# Patient Record
Sex: Female | Born: 1935 | Race: White | Hispanic: No | Smoking: Never smoker
Health system: Southern US, Community
[De-identification: ages and names within clinical notes are randomized; demographics above are authoritative.]

## PROBLEM LIST (undated history)

## (undated) DIAGNOSIS — I10 Essential (primary) hypertension: Secondary | ICD-10-CM

## (undated) DIAGNOSIS — C4492 Squamous cell carcinoma of skin, unspecified: Secondary | ICD-10-CM

## (undated) DIAGNOSIS — I4819 Other persistent atrial fibrillation: Secondary | ICD-10-CM

## (undated) DIAGNOSIS — F419 Anxiety disorder, unspecified: Secondary | ICD-10-CM

## (undated) DIAGNOSIS — Z9289 Personal history of other medical treatment: Secondary | ICD-10-CM

## (undated) DIAGNOSIS — I440 Atrioventricular block, first degree: Secondary | ICD-10-CM

## (undated) DIAGNOSIS — E785 Hyperlipidemia, unspecified: Secondary | ICD-10-CM

## (undated) DIAGNOSIS — K219 Gastro-esophageal reflux disease without esophagitis: Secondary | ICD-10-CM

## (undated) DIAGNOSIS — E039 Hypothyroidism, unspecified: Secondary | ICD-10-CM

## (undated) DIAGNOSIS — J302 Other seasonal allergic rhinitis: Secondary | ICD-10-CM

## (undated) DIAGNOSIS — M1990 Unspecified osteoarthritis, unspecified site: Secondary | ICD-10-CM

## (undated) DIAGNOSIS — Z8719 Personal history of other diseases of the digestive system: Secondary | ICD-10-CM

## (undated) DIAGNOSIS — I471 Supraventricular tachycardia, unspecified: Secondary | ICD-10-CM

## (undated) DIAGNOSIS — IMO0002 Reserved for concepts with insufficient information to code with codable children: Secondary | ICD-10-CM

## (undated) HISTORY — DX: Atrioventricular block, first degree: I44.0

## (undated) HISTORY — DX: Gastro-esophageal reflux disease without esophagitis: K21.9

## (undated) HISTORY — PX: DILATION AND CURETTAGE OF UTERUS: SHX78

## (undated) HISTORY — PX: ABDOMINAL HYSTERECTOMY: SHX81

## (undated) HISTORY — DX: Supraventricular tachycardia, unspecified: I47.10

## (undated) HISTORY — DX: Supraventricular tachycardia: I47.1

## (undated) HISTORY — DX: Personal history of other medical treatment: Z92.89

## (undated) HISTORY — PX: LAPAROSCOPIC BILATERAL SALPINGO OOPHERECTOMY: SHX5890

## (undated) HISTORY — DX: Hypothyroidism, unspecified: E03.9

## (undated) HISTORY — PX: APPENDECTOMY: SHX54

## (undated) HISTORY — DX: Other persistent atrial fibrillation: I48.19

## (undated) HISTORY — DX: Essential (primary) hypertension: I10

## (undated) HISTORY — DX: Hyperlipidemia, unspecified: E78.5

## (undated) HISTORY — DX: Other seasonal allergic rhinitis: J30.2

---

## 1989-03-29 HISTORY — PX: KNEE ARTHROSCOPY: SHX127

## 1989-03-29 HISTORY — PX: CARDIAC CATHETERIZATION: SHX172

## 1999-01-09 ENCOUNTER — Ambulatory Visit (HOSPITAL_COMMUNITY): Admission: RE | Admit: 1999-01-09 | Discharge: 1999-01-09 | Payer: Self-pay | Admitting: Cardiology

## 1999-05-11 ENCOUNTER — Encounter: Admission: RE | Admit: 1999-05-11 | Discharge: 1999-05-11 | Payer: Self-pay | Admitting: Internal Medicine

## 1999-05-21 ENCOUNTER — Encounter: Admission: RE | Admit: 1999-05-21 | Discharge: 1999-05-21 | Payer: Self-pay | Admitting: Internal Medicine

## 1999-05-21 ENCOUNTER — Encounter: Payer: Self-pay | Admitting: Internal Medicine

## 2000-01-08 ENCOUNTER — Ambulatory Visit (HOSPITAL_COMMUNITY): Admission: RE | Admit: 2000-01-08 | Discharge: 2000-01-08 | Payer: Self-pay | Admitting: *Deleted

## 2000-06-10 ENCOUNTER — Encounter: Admission: RE | Admit: 2000-06-10 | Discharge: 2000-06-10 | Payer: Self-pay | Admitting: *Deleted

## 2001-02-26 ENCOUNTER — Encounter: Admission: RE | Admit: 2001-02-26 | Discharge: 2001-02-26 | Payer: Self-pay | Admitting: *Deleted

## 2001-03-12 ENCOUNTER — Other Ambulatory Visit: Admission: RE | Admit: 2001-03-12 | Discharge: 2001-03-12 | Payer: Self-pay | Admitting: Obstetrics and Gynecology

## 2001-04-07 ENCOUNTER — Ambulatory Visit: Admission: RE | Admit: 2001-04-07 | Discharge: 2001-04-07 | Payer: Self-pay | Admitting: Gynecology

## 2001-04-16 ENCOUNTER — Encounter: Payer: Self-pay | Admitting: Obstetrics and Gynecology

## 2001-04-21 ENCOUNTER — Inpatient Hospital Stay (HOSPITAL_COMMUNITY): Admission: RE | Admit: 2001-04-21 | Discharge: 2001-04-24 | Payer: Self-pay | Admitting: Obstetrics and Gynecology

## 2001-04-21 ENCOUNTER — Encounter (INDEPENDENT_AMBULATORY_CARE_PROVIDER_SITE_OTHER): Payer: Self-pay | Admitting: *Deleted

## 2001-06-15 ENCOUNTER — Encounter: Admission: RE | Admit: 2001-06-15 | Discharge: 2001-06-15 | Payer: Self-pay | Admitting: Obstetrics and Gynecology

## 2001-06-15 ENCOUNTER — Encounter: Payer: Self-pay | Admitting: Obstetrics and Gynecology

## 2001-08-28 ENCOUNTER — Encounter: Admission: RE | Admit: 2001-08-28 | Discharge: 2001-08-28 | Payer: Self-pay | Admitting: Obstetrics and Gynecology

## 2001-08-28 ENCOUNTER — Encounter: Payer: Self-pay | Admitting: Obstetrics and Gynecology

## 2002-06-17 ENCOUNTER — Encounter: Admission: RE | Admit: 2002-06-17 | Discharge: 2002-06-17 | Payer: Self-pay | Admitting: Internal Medicine

## 2003-02-21 ENCOUNTER — Ambulatory Visit (HOSPITAL_COMMUNITY): Admission: RE | Admit: 2003-02-21 | Discharge: 2003-02-21 | Payer: Self-pay | Admitting: Gastroenterology

## 2003-06-21 ENCOUNTER — Encounter: Admission: RE | Admit: 2003-06-21 | Discharge: 2003-06-21 | Payer: Self-pay | Admitting: Internal Medicine

## 2005-04-15 ENCOUNTER — Encounter: Payer: Self-pay | Admitting: Internal Medicine

## 2005-05-10 ENCOUNTER — Encounter: Admission: RE | Admit: 2005-05-10 | Discharge: 2005-05-10 | Payer: Self-pay | Admitting: Internal Medicine

## 2006-04-18 ENCOUNTER — Encounter: Admission: RE | Admit: 2006-04-18 | Discharge: 2006-04-18 | Payer: Self-pay | Admitting: Internal Medicine

## 2006-07-24 ENCOUNTER — Encounter: Payer: Self-pay | Admitting: Internal Medicine

## 2007-10-14 ENCOUNTER — Encounter: Admission: RE | Admit: 2007-10-14 | Discharge: 2007-10-14 | Payer: Self-pay | Admitting: Internal Medicine

## 2008-12-24 ENCOUNTER — Inpatient Hospital Stay (HOSPITAL_COMMUNITY): Admission: EM | Admit: 2008-12-24 | Discharge: 2008-12-25 | Payer: Self-pay | Admitting: Emergency Medicine

## 2009-01-12 ENCOUNTER — Encounter: Payer: Self-pay | Admitting: Internal Medicine

## 2009-01-19 ENCOUNTER — Ambulatory Visit: Payer: Self-pay | Admitting: *Deleted

## 2009-01-19 ENCOUNTER — Encounter: Payer: Self-pay | Admitting: Internal Medicine

## 2009-02-02 ENCOUNTER — Encounter: Payer: Self-pay | Admitting: Internal Medicine

## 2009-02-07 ENCOUNTER — Encounter: Payer: Self-pay | Admitting: Internal Medicine

## 2009-07-29 HISTORY — PX: CATARACT EXTRACTION W/ INTRAOCULAR LENS  IMPLANT, BILATERAL: SHX1307

## 2009-08-22 ENCOUNTER — Observation Stay (HOSPITAL_COMMUNITY): Admission: EM | Admit: 2009-08-22 | Discharge: 2009-08-23 | Payer: Self-pay | Admitting: Emergency Medicine

## 2009-08-22 ENCOUNTER — Ambulatory Visit: Payer: Self-pay | Admitting: Cardiology

## 2009-08-23 ENCOUNTER — Encounter: Payer: Self-pay | Admitting: Internal Medicine

## 2009-08-23 ENCOUNTER — Encounter (INDEPENDENT_AMBULATORY_CARE_PROVIDER_SITE_OTHER): Payer: Self-pay | Admitting: Internal Medicine

## 2009-08-29 ENCOUNTER — Telehealth: Payer: Self-pay | Admitting: Internal Medicine

## 2009-08-29 ENCOUNTER — Telehealth (INDEPENDENT_AMBULATORY_CARE_PROVIDER_SITE_OTHER): Payer: Self-pay | Admitting: *Deleted

## 2009-08-31 ENCOUNTER — Telehealth: Payer: Self-pay | Admitting: Internal Medicine

## 2009-09-04 ENCOUNTER — Ambulatory Visit: Payer: Self-pay | Admitting: Cardiovascular Disease

## 2009-09-04 DIAGNOSIS — I471 Supraventricular tachycardia: Secondary | ICD-10-CM

## 2009-09-05 ENCOUNTER — Telehealth (INDEPENDENT_AMBULATORY_CARE_PROVIDER_SITE_OTHER): Payer: Self-pay | Admitting: *Deleted

## 2009-09-18 ENCOUNTER — Ambulatory Visit: Payer: Self-pay | Admitting: Internal Medicine

## 2009-10-06 ENCOUNTER — Ambulatory Visit: Payer: Self-pay | Admitting: Internal Medicine

## 2009-10-06 ENCOUNTER — Inpatient Hospital Stay (HOSPITAL_COMMUNITY): Admission: EM | Admit: 2009-10-06 | Discharge: 2009-10-08 | Payer: Self-pay | Admitting: Emergency Medicine

## 2009-10-09 ENCOUNTER — Telehealth: Payer: Self-pay | Admitting: Internal Medicine

## 2009-10-11 ENCOUNTER — Ambulatory Visit: Payer: Self-pay | Admitting: Internal Medicine

## 2009-10-11 LAB — CONVERTED CEMR LAB: POC INR: 4

## 2009-10-16 ENCOUNTER — Encounter: Payer: Self-pay | Admitting: Internal Medicine

## 2009-10-16 ENCOUNTER — Ambulatory Visit: Payer: Self-pay | Admitting: Cardiology

## 2009-10-16 LAB — CONVERTED CEMR LAB: POC INR: 1.3

## 2009-10-24 ENCOUNTER — Ambulatory Visit: Payer: Self-pay | Admitting: Internal Medicine

## 2009-10-24 LAB — CONVERTED CEMR LAB: POC INR: 1.1

## 2009-10-31 ENCOUNTER — Ambulatory Visit: Payer: Self-pay | Admitting: Internal Medicine

## 2009-10-31 DIAGNOSIS — I4891 Unspecified atrial fibrillation: Secondary | ICD-10-CM

## 2009-10-31 DIAGNOSIS — I10 Essential (primary) hypertension: Secondary | ICD-10-CM

## 2009-11-07 ENCOUNTER — Ambulatory Visit: Payer: Self-pay | Admitting: Cardiovascular Disease

## 2009-11-07 LAB — CONVERTED CEMR LAB: POC INR: 1.5

## 2009-11-10 ENCOUNTER — Telehealth: Payer: Self-pay | Admitting: Internal Medicine

## 2009-11-14 ENCOUNTER — Ambulatory Visit: Payer: Self-pay | Admitting: Cardiovascular Disease

## 2009-11-21 ENCOUNTER — Ambulatory Visit: Payer: Self-pay | Admitting: Internal Medicine

## 2009-11-21 ENCOUNTER — Ambulatory Visit: Payer: Self-pay | Admitting: Cardiovascular Disease

## 2009-11-23 ENCOUNTER — Telehealth: Payer: Self-pay | Admitting: Internal Medicine

## 2009-11-23 LAB — CONVERTED CEMR LAB
BUN: 21 mg/dL (ref 6–23)
Calcium: 9.1 mg/dL (ref 8.4–10.5)
Chloride: 104 meq/L (ref 96–112)
Sodium: 141 meq/L (ref 135–145)

## 2009-11-24 ENCOUNTER — Telehealth: Payer: Self-pay | Admitting: Internal Medicine

## 2009-11-24 ENCOUNTER — Inpatient Hospital Stay (HOSPITAL_COMMUNITY): Admission: EM | Admit: 2009-11-24 | Discharge: 2009-11-25 | Payer: Self-pay | Admitting: Emergency Medicine

## 2009-11-24 ENCOUNTER — Ambulatory Visit: Payer: Self-pay | Admitting: Cardiology

## 2009-11-27 ENCOUNTER — Telehealth: Payer: Self-pay | Admitting: Internal Medicine

## 2009-11-28 ENCOUNTER — Ambulatory Visit: Payer: Self-pay | Admitting: Cardiology

## 2009-11-28 ENCOUNTER — Encounter: Payer: Self-pay | Admitting: Internal Medicine

## 2009-11-28 LAB — CONVERTED CEMR LAB: POC INR: 2.7

## 2009-12-05 ENCOUNTER — Telehealth: Payer: Self-pay | Admitting: Nurse Practitioner

## 2009-12-06 ENCOUNTER — Ambulatory Visit: Payer: Self-pay | Admitting: Internal Medicine

## 2009-12-06 ENCOUNTER — Encounter: Payer: Self-pay | Admitting: Internal Medicine

## 2009-12-06 LAB — CONVERTED CEMR LAB: POC INR: 5.1

## 2009-12-07 ENCOUNTER — Ambulatory Visit: Payer: Self-pay | Admitting: Internal Medicine

## 2009-12-07 ENCOUNTER — Ambulatory Visit (HOSPITAL_COMMUNITY): Admission: RE | Admit: 2009-12-07 | Discharge: 2009-12-07 | Payer: Self-pay | Admitting: Internal Medicine

## 2009-12-12 ENCOUNTER — Ambulatory Visit: Payer: Self-pay | Admitting: Internal Medicine

## 2009-12-13 ENCOUNTER — Ambulatory Visit: Payer: Self-pay | Admitting: Cardiovascular Disease

## 2009-12-13 ENCOUNTER — Inpatient Hospital Stay (HOSPITAL_COMMUNITY): Admission: EM | Admit: 2009-12-13 | Discharge: 2009-12-13 | Payer: Self-pay | Admitting: Emergency Medicine

## 2009-12-21 ENCOUNTER — Ambulatory Visit: Payer: Self-pay | Admitting: Cardiology

## 2009-12-21 ENCOUNTER — Encounter: Payer: Self-pay | Admitting: Internal Medicine

## 2009-12-21 ENCOUNTER — Ambulatory Visit: Payer: Self-pay | Admitting: Internal Medicine

## 2009-12-21 LAB — CONVERTED CEMR LAB: POC INR: 4

## 2009-12-22 ENCOUNTER — Telehealth: Payer: Self-pay | Admitting: Internal Medicine

## 2009-12-26 ENCOUNTER — Telehealth: Payer: Self-pay | Admitting: Internal Medicine

## 2009-12-28 ENCOUNTER — Ambulatory Visit: Payer: Self-pay

## 2009-12-28 ENCOUNTER — Encounter: Payer: Self-pay | Admitting: Internal Medicine

## 2010-01-03 ENCOUNTER — Ambulatory Visit: Payer: Self-pay | Admitting: Cardiology

## 2010-01-08 ENCOUNTER — Ambulatory Visit: Payer: Self-pay | Admitting: Internal Medicine

## 2010-01-08 ENCOUNTER — Encounter: Payer: Self-pay | Admitting: Cardiology

## 2010-01-17 ENCOUNTER — Ambulatory Visit: Payer: Self-pay | Admitting: Cardiovascular Disease

## 2010-01-17 LAB — CONVERTED CEMR LAB: POC INR: 2.3

## 2010-01-30 ENCOUNTER — Encounter: Payer: Self-pay | Admitting: Internal Medicine

## 2010-01-30 ENCOUNTER — Ambulatory Visit: Payer: Self-pay | Admitting: Internal Medicine

## 2010-02-14 ENCOUNTER — Telehealth: Payer: Self-pay | Admitting: Internal Medicine

## 2010-02-21 ENCOUNTER — Ambulatory Visit: Payer: Self-pay | Admitting: Cardiology

## 2010-02-21 LAB — CONVERTED CEMR LAB: POC INR: 1.7

## 2010-03-07 ENCOUNTER — Ambulatory Visit: Payer: Self-pay | Admitting: Cardiology

## 2010-03-07 LAB — CONVERTED CEMR LAB: POC INR: 1.8

## 2010-03-14 ENCOUNTER — Encounter: Payer: Self-pay | Admitting: Internal Medicine

## 2010-03-14 ENCOUNTER — Telehealth: Payer: Self-pay | Admitting: Internal Medicine

## 2010-03-15 ENCOUNTER — Ambulatory Visit: Payer: Self-pay | Admitting: Internal Medicine

## 2010-03-15 DIAGNOSIS — I495 Sick sinus syndrome: Secondary | ICD-10-CM | POA: Insufficient documentation

## 2010-03-16 ENCOUNTER — Ambulatory Visit: Payer: Self-pay | Admitting: Internal Medicine

## 2010-03-19 ENCOUNTER — Telehealth (INDEPENDENT_AMBULATORY_CARE_PROVIDER_SITE_OTHER): Payer: Self-pay | Admitting: *Deleted

## 2010-03-19 ENCOUNTER — Encounter: Payer: Self-pay | Admitting: Internal Medicine

## 2010-03-19 ENCOUNTER — Telehealth: Payer: Self-pay | Admitting: Internal Medicine

## 2010-03-20 LAB — CONVERTED CEMR LAB
BUN: 19 mg/dL (ref 6–23)
CO2: 31 meq/L (ref 19–32)
Calcium: 9.3 mg/dL (ref 8.4–10.5)
Creatinine, Ser: 1 mg/dL (ref 0.4–1.2)
GFR calc non Af Amer: 58.26 mL/min (ref >60)
Glucose, Bld: 81 mg/dL (ref 70–99)
Potassium: 4 meq/L (ref 3.5–5.1)
Prothrombin Time: 28.7 s — ABNORMAL HIGH (ref 9.7–11.8)

## 2010-03-22 ENCOUNTER — Ambulatory Visit: Payer: Self-pay | Admitting: Internal Medicine

## 2010-03-22 ENCOUNTER — Inpatient Hospital Stay (HOSPITAL_COMMUNITY): Admission: AD | Admit: 2010-03-22 | Discharge: 2010-03-26 | Payer: Self-pay | Admitting: Internal Medicine

## 2010-03-29 ENCOUNTER — Telehealth: Payer: Self-pay | Admitting: Internal Medicine

## 2010-03-30 ENCOUNTER — Telehealth (INDEPENDENT_AMBULATORY_CARE_PROVIDER_SITE_OTHER): Payer: Self-pay | Admitting: *Deleted

## 2010-03-30 ENCOUNTER — Encounter: Payer: Self-pay | Admitting: Internal Medicine

## 2010-04-03 ENCOUNTER — Ambulatory Visit: Payer: Self-pay | Admitting: Cardiovascular Disease

## 2010-04-11 ENCOUNTER — Encounter: Payer: Self-pay | Admitting: Physician Assistant

## 2010-04-11 ENCOUNTER — Ambulatory Visit: Payer: Self-pay | Admitting: Internal Medicine

## 2010-04-24 ENCOUNTER — Telehealth: Payer: Self-pay | Admitting: Internal Medicine

## 2010-04-30 ENCOUNTER — Ambulatory Visit: Payer: Self-pay | Admitting: Internal Medicine

## 2010-05-01 ENCOUNTER — Ambulatory Visit: Payer: Self-pay | Admitting: Cardiology

## 2010-05-28 ENCOUNTER — Telehealth: Payer: Self-pay | Admitting: Internal Medicine

## 2010-05-28 ENCOUNTER — Ambulatory Visit: Payer: Self-pay | Admitting: Internal Medicine

## 2010-05-28 DIAGNOSIS — E785 Hyperlipidemia, unspecified: Secondary | ICD-10-CM | POA: Insufficient documentation

## 2010-05-29 ENCOUNTER — Ambulatory Visit: Payer: Self-pay | Admitting: Cardiovascular Disease

## 2010-05-29 LAB — CONVERTED CEMR LAB: POC INR: 2.1

## 2010-06-10 ENCOUNTER — Telehealth: Payer: Self-pay | Admitting: Nurse Practitioner

## 2010-06-11 ENCOUNTER — Telehealth: Payer: Self-pay | Admitting: Internal Medicine

## 2010-06-28 ENCOUNTER — Ambulatory Visit: Payer: Self-pay | Admitting: Cardiovascular Disease

## 2010-06-28 LAB — CONVERTED CEMR LAB: POC INR: 1.5

## 2010-07-10 ENCOUNTER — Ambulatory Visit: Payer: Self-pay | Admitting: Internal Medicine

## 2010-07-12 LAB — CONVERTED CEMR LAB: POC INR: 1.5

## 2010-07-16 ENCOUNTER — Telehealth: Payer: Self-pay | Admitting: Internal Medicine

## 2010-07-27 ENCOUNTER — Ambulatory Visit: Payer: Self-pay | Admitting: Cardiovascular Disease

## 2010-07-27 LAB — CONVERTED CEMR LAB: POC INR: 1.5

## 2010-07-31 ENCOUNTER — Encounter: Payer: Self-pay | Admitting: Internal Medicine

## 2010-07-31 ENCOUNTER — Ambulatory Visit
Admission: RE | Admit: 2010-07-31 | Discharge: 2010-07-31 | Payer: Self-pay | Source: Home / Self Care | Attending: Internal Medicine | Admitting: Internal Medicine

## 2010-07-31 DIAGNOSIS — R519 Headache, unspecified: Secondary | ICD-10-CM | POA: Insufficient documentation

## 2010-07-31 DIAGNOSIS — R51 Headache: Secondary | ICD-10-CM | POA: Insufficient documentation

## 2010-08-02 ENCOUNTER — Telehealth: Payer: Self-pay | Admitting: Internal Medicine

## 2010-08-13 ENCOUNTER — Other Ambulatory Visit: Payer: Self-pay | Admitting: Internal Medicine

## 2010-08-13 ENCOUNTER — Ambulatory Visit: Admission: RE | Admit: 2010-08-13 | Discharge: 2010-08-13 | Payer: Self-pay | Source: Home / Self Care

## 2010-08-13 ENCOUNTER — Ambulatory Visit
Admission: RE | Admit: 2010-08-13 | Discharge: 2010-08-13 | Payer: Self-pay | Source: Home / Self Care | Attending: Internal Medicine | Admitting: Internal Medicine

## 2010-08-13 LAB — BASIC METABOLIC PANEL
BUN: 25 mg/dL — ABNORMAL HIGH (ref 6–23)
CO2: 29 mEq/L (ref 19–32)
Calcium: 9.3 mg/dL (ref 8.4–10.5)
Chloride: 99 mEq/L (ref 96–112)
Creatinine, Ser: 1.1 mg/dL (ref 0.4–1.2)
GFR: 49.3 mL/min — ABNORMAL LOW (ref >60.00)
Glucose, Bld: 87 mg/dL (ref 70–99)
Potassium: 4.7 mEq/L (ref 3.5–5.1)
Sodium: 137 mEq/L (ref 135–145)

## 2010-08-13 LAB — MAGNESIUM: Magnesium: 1.9 mg/dL (ref 1.5–2.5)

## 2010-08-26 LAB — CONVERTED CEMR LAB
Calcium: 9.5 mg/dL (ref 8.4–10.5)
Chloride: 98 meq/L (ref 96–112)
Creatinine, Ser: 1 mg/dL (ref 0.4–1.2)
POC INR: 3.2
Pro B Natriuretic peptide (BNP): 161 pg/mL — ABNORMAL HIGH (ref 0.0–100.0)
Sodium: 133 meq/L — ABNORMAL LOW (ref 135–145)

## 2010-08-28 ENCOUNTER — Ambulatory Visit: Admission: RE | Admit: 2010-08-28 | Discharge: 2010-08-28 | Payer: Self-pay | Source: Home / Self Care

## 2010-08-28 LAB — CONVERTED CEMR LAB: POC INR: 3.1

## 2010-08-28 NOTE — Progress Notes (Signed)
Summary: pt in a-fib  Phone Note Call from Patient Call back at Home Phone [phone]   Caller: Patient Reason for Call: Talk to Nurse, Talk to Doctor Summary of Call: pt went to pcp and was told she is back in a-fib and to call Dr. Graciela Husbands right away and all information would be faxed Initial call taken by: Omer Jack,  March 14, 2010 1:26 PM  Follow-up for Phone Call        spoke with pt, she could tell she was going into a fib and her pcp confirmed her rhythm. her rate was 50 to 60. she feels very tired and weak and is having alittle SOB. at her last visit she and dr Graciela Husbands talked about cardioversion but because she was in sinus it did not need to be done. she was on pradaxa but had to switch back to coumadin due to side effects. she recently had an INR of 1.8. will discuss with dr Doris Cheadle, RN  March 14, 2010 1:51 PM  Additional Follow-up for Phone Call Additional follow up Details #1::        discussed with dr Graciela Husbands, he will see the pt tomorrow at 4:30pm. pt aware Deliah Goody, RN  March 14, 2010 2:05 PM

## 2010-08-28 NOTE — Medication Information (Signed)
Summary: rov/tm  Anticoagulant Therapy  Managed by: Bethena Midget, RN, BSN Referring MD: Clifton James MD, Sherrilyn Rist MD: Eden Emms MD, Theron Arista Indication 1: Atrial Fibrillation Lab Used: [facility] [place] Site: [address] INR POC 2.1 INR RANGE 2.0-3.0  Dietary changes: no    Health status changes: no    Bleeding/hemorrhagic complications: no    Recent/future hospitalizations: no    Any changes in medication regimen? no    Recent/future dental: no  Any missed doses?: no       Is patient compliant with meds? yes      Comments: Pending DCCV  Allergies: 1)  ! Sulfa  Anticoagulation Management History:      The patient is taking warfarin and comes in today for a routine follow up visit.  Positive risk factors for bleeding include an age of 75 years or older.  The bleeding index is 'intermediate risk'.  Positive CHADS2 values include History of HTN and Age > 74 years old.  Anticoagulation responsible provider: Eden Emms MD, Theron Arista.  INR POC: 2.1.  Cuvette Lot#: [id number].  Exp: 12/2010.    Anticoagulation Management Assessment/Plan:      The patient's current anticoagulation dose is Warfarin sodium 2.5 mg tabs: Use as directed by Anticoagualtion Clinic.  The target INR is 2.0-3.0.  The next INR is due 11/28/2009.  Anticoagulation instructions were given to patient.  Results were reviewed/authorized by Bethena Midget, RN, BSN.  She was notified by Bethena Midget, RN, BSN.         Prior Anticoagulation Instructions: Pending DCCV INR 2.2 Continue 2.5mg s daily. Recheck in one week.   Current Anticoagulation Instructions: INR 2.1 Today 2 pills then change dose to 1 pill everyday except 2 pills on Tuesdays. Recheck in one week.

## 2010-08-28 NOTE — Medication Information (Signed)
Summary: new to coumadin/afib  Anticoagulant Therapy  Managed by: Bethena Midget, RN, BSN Referring MD: Clifton Ledger Heindl MD, Sherrilyn Rist MD: Johney Frame MD, Fayrene Fearing Indication 1: Atrial Fibrillation Lab Used: [facility] [name]: [address] INR POC 4.0 INR RANGE 2.0-3.0  Dietary changes: no    Health status changes: no    Bleeding/hemorrhagic complications: no    Recent/future hospitalizations: no    Any changes in medication regimen? no    Recent/future dental: no  Any missed doses?: no       Is patient compliant with meds? yes      Comments: Admitted [facility] from 3/11 to 3/13.  In hospital given 5mg s on 11th and 12th. INR on 3/12 was 1.09 and 1.52 on 13th. Discharged home on 2.5mg s daily.   Current Medications (verified): 1)  Metoprolol Succinate 50 Mg Xr24h-Tab (Metoprolol Succinate) .... Take 1.5 Tablet By Mouth Two Times A Day 2)  Zocor 20 Mg Tabs (Simvastatin) .[name].. 1 Tab At Bedtime 3)  Levoxyl 50 Mcg Tabs (Levothyroxine Sodium) .[name].. 1 Tab Once Daily 4)  Allegra 180 Mg Tabs (Fexofenadine Hcl) .[name].. 1 Tab Once Daily 5)  Aspirin 81 Mg Tbec (Aspirin) .... Take One Tablet By Mouth Daily 6)  Vitamin D 2000 Unit Tabs (Cholecalciferol) .[name].. 1 Tab Once Daily 7)  Protonix 40 Mg Tbec (Pantoprazole Sodium) .[name].. 1 Tab Once Daily 8)  Fish Oil 1000 Mg Caps (Omega-3 Fatty Acids) .... Once Daily 9)  Multivitamins  Tabs (Multiple Vitamin) .... Once Daily 10)  Icaps  Caps (Multiple Vitamins-Minerals) .... Take 1 Daily 11)  Warfarin Sodium 2.5 Mg Tabs (Warfarin Sodium) .... Use As Directed By Anticoagualtion Clinic 12)  Twynsta 80-5 Mg Tabs (Telmisartan-Amlodipine) .... Take 1 Tablet Daily  Allergies (verified): 1)  ! Sulfa  Anticoagulation Management History:      The patient comes in today for her initial visit for anticoagulation therapy.  Positive risk factors for bleeding include an age of 34 years or older.  The bleeding index is 'intermediate risk'.  Positive  CHADS2 values include Age > 29 years old.  Anticoagulation responsible provider: [name] MD, Fayrene Fearing.  INR POC: 4.0.  Cuvette Lot#: [id number].  Exp: 11/2010.    Anticoagulation Management Assessment/Plan:      The patient's current anticoagulation dose is Warfarin sodium 2.5 mg tabs: Use as directed by Anticoagualtion Clinic.  The target INR is 2.0-3.0.  The next INR is due 10/16/2009.  Anticoagulation instructions were given to patient.  Results were reviewed/authorized by Bethena Midget, RN, BSN.  She was notified by Bethena Midget, RN, BSN.         Current Anticoagulation Instructions: INR 4.0 Skip today's dose of warfarin then change dose to 1/2 pill everyday. Recheck in 5 days.

## 2010-08-28 NOTE — Medication Information (Signed)
Summary: rov/lb  Anticoagulant Therapy  Managed by: Eda Keys, PharmD Referring MD: Clifton James MD, Sherrilyn Rist MD: Gala Romney MD, Reuel Boom Indication 1: Atrial Fibrillation Lab Used: [facility] [place] Site: [address] INR POC 1.1 INR RANGE 2.0-3.0  Dietary changes: no    Health status changes: yes       Details: pt has been experiencing chest tightness but is scheduled to see dr. Graciela Husbands next week  Bleeding/hemorrhagic complications: no    Recent/future hospitalizations: no    Any changes in medication regimen? yes       Details: see updated med list  Recent/future dental: no  Any missed doses?: no       Is patient compliant with meds? yes       Current Medications (verified): 1)  Metoprolol Succinate 50 Mg Xr24h-Tab (Metoprolol Succinate) .... Take 1.5 Tablet By Mouth Two Times A Day 2)  Zocor 20 Mg Tabs (Simvastatin) .[name].. 1 Tab At Bedtime 3)  Levoxyl 50 Mcg Tabs (Levothyroxine Sodium) .[name].. 1 Tab Once Daily 4)  Allegra 180 Mg Tabs (Fexofenadine Hcl) .[name].. 1 Tab Once Daily 5)  Aspirin 81 Mg Tbec (Aspirin) .... Take One Tablet By Mouth Daily 6)  Vitamin D 2000 Unit Tabs (Cholecalciferol) .[name].. 1 Tab Once Daily 7)  Protonix 40 Mg Tbec (Pantoprazole Sodium) .[name].. 1 Tab Once Daily 8)  Fish Oil 1000 Mg Caps (Omega-3 Fatty Acids) .... Once Daily 9)  Multivitamins  Tabs (Multiple Vitamin) .... Once Daily 10)  Icaps  Caps (Multiple Vitamins-Minerals) .... Take 1 Daily 11)  Warfarin Sodium 2.5 Mg Tabs (Warfarin Sodium) .... Use As Directed By Anticoagualtion Clinic 12)  Mucinex Dm 30-600 Mg Xr12h-Tab (Dextromethorphan-Guaifenesin) .... Prn 13)  Micardis 80 Mg Tabs (Telmisartan) .... Take 1 Tablet Daily.  Allergies (verified): 1)  ! Sulfa  Anticoagulation Management History:      The patient is taking warfarin and comes in today for a routine follow up visit.  Positive risk factors for bleeding include an age of 43 years or older.  The bleeding index  is 'intermediate risk'.  Positive CHADS2 values include Age > 83 years old.  Anticoagulation responsible provider: Magally Vahle MD, Reuel Boom.  INR POC: 1.1.  Cuvette Lot#: [id number].  Exp: 11/2010.    Anticoagulation Management Assessment/Plan:      The patient's current anticoagulation dose is Warfarin sodium 2.5 mg tabs: Use as directed by Anticoagualtion Clinic.  The target INR is 2.0-3.0.  The next INR is due 10/31/2009.  Anticoagulation instructions were given to patient.  Results were reviewed/authorized by Eda Keys, PharmD.  She was notified by Eda Keys.         Prior Anticoagulation Instructions: INR 1.3  Start taking 1 tablet on Mondays and 1/2 tablet all other days of the week.  Recheck in 1 week.  Current Anticoagulation Instructions: INR 1.1  Start NEW dosing schedule of 1 tablet on Monday, Wednesday, and Friday, and 1/2 tablet all other days.  Return to clinic in 1 week.

## 2010-08-28 NOTE — Assessment & Plan Note (Signed)
Summary: eph/post cath/jss   Visit Type:  Follow-up Primary Provider:  Ricki Miller  CC:  no complaints.  History of Present Illness:  [name] is  in followup for atrial fibrillation with a variable ventricular response most recently quite rapid.  She has normal left ventricular function and a nonischemic Myoview in 2010. We undertook cardioversion without antiarrhythmic support about 3 or 4 weeks ago. She maintained sinus rhythm for just a few days and then reverted to a to fibrillation with a rapid ventricular response prompting repeat hospitalization. We then discussed rate versus rhythm control. Her heart rates were much better controlled on diltiazem and she felt better and she decided to go home. She returns today for followup.  She remains quite symptomatic with fatigue some tachycardia palpitations breathlessness and some left-sided chest pain.  She also knows that she has had her palm with a occipital headache dating to her cardioversion . reviewing her records her creatinine was 0.96 on May 18   Current Medications (verified): 1)  Metoprolol Succinate 100 Mg Xr24h-Tab (Metoprolol Succinate) .[name].. 1 By Mouth Two Times A Day 2)  Zocor 20 Mg Tabs (Simvastatin) .[name].. 1 Tab At Bedtime 3)  Levoxyl 50 Mcg Tabs (Levothyroxine Sodium) .[name].. 1 Tab Once Daily 4)  Allegra 180 Mg Tabs (Fexofenadine Hcl) .[name].. 1 Tab Once Daily 5)  Aspirin 81 Mg Tbec (Aspirin) .... Take One Tablet By Mouth Daily 6)  Vitamin D 2000 Unit Tabs (Cholecalciferol) .[name].. 1 Tab Once Daily 7)  Protonix 40 Mg Tbec (Pantoprazole Sodium) .[name].. 1 Tab Once Daily 8)  Fish Oil 1000 Mg Caps (Omega-3 Fatty Acids) .... Once Daily 9)  Multivitamins  Tabs (Multiple Vitamin) .... Once Daily 10)  Icaps  Caps (Multiple Vitamins-Minerals) .... Take 1 Daily 11)  Warfarin Sodium 2.5 Mg Tabs (Warfarin Sodium) .... Use As Directed By Anticoagualtion Clinic 12)  Mucinex Dm 30-600 Mg Xr12h-Tab (Dextromethorphan-Guaifenesin) .... Prn 13)  Micardis Hct  80-12.5 Mg Tabs (Telmisartan-Hctz) .... One Tablet Daily 14)  Alprazolam 0.25 Mg Tabs (Alprazolam) .... Take 1 Tablet By Mouth At Bedtime As Needed For Sleep 15)  Micardis 80 Mg Tabs (Telmisartan) .[name].. 1 By Mouth Daily 16)  Cardizem 120 Mg Tabs (Diltiazem Hcl) .[name].. 1 Podaily  Allergies (verified): 1)  ! Sulfa  Past History:  Past Medical History: Last updated: 10/31/2009 atrial fibrillation 1.  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     The patient reports having 1 Holter monitor in the past as        recently as this past summer with [facility].   2. History of chest pain in the setting of palpitations.       a.     The patient reports normal catheterization about 10 years        ago, performed by Dr. Aleen Campi.       b.     Normal stress test in 2010, performed by Dr. Reyes Ivan.   3. Hypertension.   4. Hypothyroidism.   5. Hyperlipidemia.   6. Gastroesophageal reflux disease.   7. Seasonal allergies.   Past Surgical History: Last updated: 09/20/09  1. Status post hysterectomy.   2. Status post laparoscopic bilateral salpingo-oophorectomy.   3. Knee surgery-right, arthroscopic  Family History: Last updated: 2009-09-20 Mother-101 when she died Father-died age 11 from cardiac issues 2 children both alive and healthy  Social History: Last updated: Sep 20, 2009 Married and lives here in [place] with her husband.  She has  two children, two grandchilren and three great grandchildren. She does not smoke, use drugs or alcohol.  Does not work outside the home Enjoys reading and going to the beach  Vital Signs:  Patient profile:   77 year old female Height:      62.5 inches Weight:      129 pounds BMI:     23.30 Pulse rate:   72 / minute Resp:     16 per minute BP sitting:   138 / 78  (right arm)  Vitals Entered By: Marrion Coy, CNA (Dec 21, 2009 1:17 PM)  Physical Exam  General:  The patient was alert and oriented  in no acute distress.Neck veins were flat, carotids were brisk. Lungs were clear. Heart sounds were irregular without murmurs or gallops. Abdomen was soft with active bowel sounds. There is no clubbing cyanosis or edema.    EKG  Procedure date:  12/21/2009  Findings:      72 Intervals-/2007/24 2 Axis is 35  Impression & Recommendations:  Problem # 1:  HEADACHE (ICD-784.0)  she presents with headache. Given her anticoagulation we will need to undertake CAT scanning. The following medications were removed from the medication list:    Aspirin 81 Mg Tbec (Aspirin) .[name]... Take one tablet by mouth daily Her updated medication list for this problem includes:    Metoprolol Succinate 100 Mg Xr24h-tab (Metoprolol succinate) .[name]... Take 1 tablet by mouth once a day  Orders: CT Scan  (CT Scan)  Problem # 2:  ATRIAL FIBRILLATION (ICD-427.31) For atrial fibrillation remains persistent. She remains quite symptomatic notwithstanding adequate heart rate control. Because of that we'll undertake antiarrhythmic strategy using flecainide 100 mg twice daily. We'll follow this with cardioversion.   we'll also plan to discontinue her aspirin which in conjunction do Coumadin only increases risk      Metoprolol Succinate 100 Mg Xr24h-tab (Metoprolol succinate) .[name]... 1 by mouth two times a day    Aspirin 81 Mg Tbec (Aspirin) .[name]... Take one tablet by mouth daily    Warfarin Sodium 2.5 Mg Tabs (Warfarin sodium) ..... Use as directed by anticoagualtion clinic  Problem # 3:  HYPERTENSION, BENIGN (ICD-401.1) retention is well controlled today. However, her medical regime required adjustments because of symptoms. We'll plan to decrease her metoprolol from 220 mg a day. We'll change her micardis 160/12.5 to 160/25.  Problem # 4:  CHEST PAIN UNSPECIFIED (ICD-786.50)  she has atypical chest pains with negative Myoview a year and a half ago The following medications were removed from the medication list:     Aspirin 81 Mg Tbec (Aspirin) .[name]... Take one tablet by mouth daily Her updated medication list for this problem includes:    Metoprolol Succinate 100 Mg Xr24h-tab (Metoprolol succinate) .[name]... Take 1 tablet by mouth once a day    Warfarin Sodium 2.5 Mg Tabs (Warfarin sodium) ..... Use as directed by anticoagualtion clinic    Cardizem 120 Mg Tabs (Diltiazem hcl) .[name]... 1 podaily  Her updated medication list for this problem includes:    Metoprolol Succinate 100 Mg Xr24h-tab (Metoprolol succinate) .[name]... 1 by mouth two times a day    Aspirin 81 Mg Tbec (Aspirin) .[name]... Take one tablet by mouth daily    Warfarin Sodium 2.5 Mg Tabs (Warfarin sodium) ..... Use as directed by anticoagualtion clinic    Cardizem 120 Mg Tabs (Diltiazem hcl) .[name]... 1 podaily  Patient Instructions: 1)  Your physician has recommended you make the following change in your medication: Start Flecainide 100mg   1 tablet twice daily.  Change Micardis to 1 tablet twice daily.  Decrease Metoprolol to 100mg  1 tablet daily. 2)  Your physician has recommended that you have a cardioversion (DCCV).  Electrical cardioversion uses a jolt of electricity to your heart either through paddles or wired patches attached to your chest. This is a controlled, usually prescheduled, procedure. Defibrillation is done under light anesthesia in the hospital, and you usually go home the day of the procedure. This is done to get your heart back into a normal rhythm. You are not awake for the procedure. Please see the instruction sheet given to you today. Prescriptions: METOPROLOL SUCCINATE 100 MG XR24H-TAB (METOPROLOL SUCCINATE) Take 1 tablet by mouth once a day  #30 x 11   Entered by:   Optometrist BSN   Authorized by:   Nathen May, MD, [facility]   Signed by:   Gypsy Balsam RN BSN on 12/21/2009   Method used:   Electronically to        [facility]  [phone]* (retail)       [address] [place], [place]  [address]       Ph:  [phone] or [phone]       Fax: [phone]   RxID:   [id number] MICARDIS HCT 80-12.5 MG TABS (TELMISARTAN-HCTZ) Take 2 tablets by mouth daily  #60 x 11   Entered by:   Optometrist BSN   Authorized by:   Nathen May, MD, [facility]   Signed by:   Gypsy Balsam RN BSN on 12/21/2009   Method used:   Electronically to        [facility]  [phone]* (retail)       [address] [place], [place]  [address]       Ph: [phone] or [phone]       Fax: [phone]   RxID:   [id number] FLECAINIDE ACETATE 100 MG TABS (FLECAINIDE ACETATE) Take one tablet by mouth every 12 hours  #60 x 11   Entered by:   Optometrist BSN   Authorized by:   Nathen May, MD, [facility]   Signed by:   Gypsy Balsam RN BSN on 12/21/2009   Method used:   Electronically to        [facility]  [phone]* (retail)       [address] [place], [place]  [address]       Ph: [phone] or [phone]       Fax: [phone]   RxID:   [id number]

## 2010-08-28 NOTE — Medication Information (Signed)
Summary: rov/sp  Anticoagulant Therapy  Managed by: Cloyde Reams, RN, BSN Referring MD: Clifton James MD, Cristal Deer PCP: Hunt Oris MD: Graciela Husbands MD, Viviann Spare Indication 1: Atrial Fibrillation Lab Used: [facility]  Site: [address] INR POC 3.2 INR RANGE 2.0-3.0  Dietary changes: no    Health status changes: no    Bleeding/hemorrhagic complications: no    Recent/future hospitalizations: no    Any changes in medication regimen? no    Recent/future dental: no  Any missed doses?: no       Is patient compliant with meds? yes       Allergies: 1)  ! Sulfa  Anticoagulation Management History:      The patient is taking warfarin and comes in today for a routine follow up visit.  Positive risk factors for bleeding include an age of 75 years or older.  The bleeding index is 'intermediate risk'.  Positive CHADS2 values include History of HTN and Age > 5 years old.  Her last INR was 5.1 ratio.  Anticoagulation responsible provider: Graciela Husbands MD, Viviann Spare.  INR POC: 3.2.  Cuvette Lot#: [id number].  Exp: 02/2011.    Anticoagulation Management Assessment/Plan:      The patient's current anticoagulation dose is Warfarin sodium 2.5 mg tabs: Use as directed by Anticoagualtion Clinic.  The target INR is 2.0-3.0.  The next INR is due 02/13/2010.  Anticoagulation instructions were given to patient.  Results were reviewed/authorized by Cloyde Reams, RN, BSN.  She was notified by Cloyde Reams RN.         Prior Anticoagulation Instructions: INR 2.3  Continue same dose of 1 tablet every day.    Current Anticoagulation Instructions: INR 3.2  Start taking 1 tablet daily except 1/2 tablet on Mondays and Fridays. Recheck in 2 weeks.

## 2010-08-28 NOTE — Assessment & Plan Note (Signed)
Summary: eph/needs bmp and ekg/amber   Visit Type:  Follow-up Primary Provider:  Ricki Miller  CC:  tightness in chest area.  History of Present Illness: This is an 77 year old white female patient who was recently hospitalized with atrial fibrillation for a tedious and load. She converted to normal sinus rhythm after the first tedious and dose with a 2 second pause. Her tickets and was decreased and she was discharged home.  She still has occasional chest tightnes and shortness of breath and feels her heart beating irregularly, but it has improved alot since discharge. She has some fatigue, but overall is feeling better.  Current Medications (verified): 1)  Metoprolol Succinate 100 Mg Xr24h-Tab (Metoprolol Succinate) .... Take 1 Tablet By Mouth Once A Day 2)  Zocor 20 Mg Tabs (Simvastatin) .[name].. 1 Tab At Bedtime 3)  Levoxyl 50 Mcg Tabs (Levothyroxine Sodium) .[name].. 1 Tab Once Daily 4)  Allegra 180 Mg Tabs (Fexofenadine Hcl) .[name].. 1 Tab Once Daily 5)  Vitamin D 2000 Unit Tabs (Cholecalciferol) .[name].. 1 Tab Once Daily 6)  Protonix 40 Mg Tbec (Pantoprazole Sodium) .[name].. 1 Tab Once Daily 7)  Fish Oil 1000 Mg Caps (Omega-3 Fatty Acids) .... Once Daily 8)  Multivitamins  Tabs (Multiple Vitamin) .... Once Daily 9)  Icaps  Caps (Multiple Vitamins-Minerals) .... Take 1 Daily 10)  Mucinex Dm 30-600 Mg Xr12h-Tab (Dextromethorphan-Guaifenesin) .... Prn 11)  Alprazolam 0.25 Mg Tabs (Alprazolam) .... Take 1 Tablet By Mouth At Bedtime As Needed For Sleep 12)  Cardizem 120 Mg Tabs (Diltiazem Hcl) .... Take 1 Capsule By Mouth Once A Day 13)  Micardis 80 Mg Tabs (Telmisartan) .... Take One Tablet in The Evening 14)  Warfarin Sodium 2.5 Mg Tabs (Warfarin Sodium) .... Use As Directed By Anticoagualtion Clinic 15)  Tikosyn 125 Mcg Caps (Dofetilide) .[name].. 1 Two Times A Day 16)  Spironolactone 25 Mg Tabs (Spironolactone) .... Take One Half  Tablet By Mouth Daily  Allergies (verified): 1)  ! Sulfa  Past History:  Past  Medical History: Last updated: 03/15/2010 atrial fibrillation failure to tolerate Pradaxa secondary to GI symptoms 1st degree AV block aggravated by flecainide 1.  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     The patient reports having 1 Holter monitor in the past as        recently as this past summer with [facility].   2. History of chest pain in the setting of palpitations.       a.     The patient reports normal catheterization about 10 years        ago, performed by Dr. Aleen Campi.       b.     Normal stress test in 2010, performed by Dr. Reyes Ivan.   3. Hypertension.   4. Hypothyroidism.   5. Hyperlipidemia.   6. Gastroesophageal reflux disease.   7. Seasonal allergies.   Review of Systems       see history of present illness  Vital Signs:  Patient profile:   77 year old female Height:      625 inches Weight:      132 pounds BMI:     0.24 Pulse rate:   68 / minute BP sitting:   128 / 74  (left arm) Cuff size:   regular  Vitals Entered By: Burnett Kanaris, CNA (April 11, 2010 9:50 AM)  Physical Exam  General:   Well-nournished, in no acute distress. Neck: Right carotid bruit,No JVD, HJR,  or thyroid enlargement Lungs: No tachypnea, clear without wheezing, rales, or rhonchi Cardiovascular: RRR, PMI not displaced, heart sounds normal, 2/6 systolic murmur at the left sternal border,no gallops, bruit, thrill, or heave. Abdomen: BS normal. Soft without organomegaly, masses, lesions or tenderness. Extremities: without cyanosis, clubbing or edema. Good distal pulses bilateral SKin: Warm, no lesions or rashes  Musculoskeletal: No deformities Neuro: no focal signs    EKG  Procedure date:  04/11/2010  Findings:      normal sinus rhythm with first degree AV block QT is 448 ms  Impression & Recommendations:  Problem # 1:  ATRIAL FIBRILLATION (ICD-427.31) Patient is maintaining normal sinus rhythm. She does have first  degree AV block. I will decrease her metoprolol to 50 mg daily.She is having less frequent chest pain since she is in normal sinus rhythm.We will check a BMET in 2 months. Her updated medication list for this problem includes:    Metoprolol Succinate 50 Mg Xr24h-tab (Metoprolol succinate) .[name]... Take one tablet by mouth daily    Warfarin Sodium 2.5 Mg Tabs (Warfarin sodium) ..... Use as directed by anticoagualtion clinic    Tikosyn 125 Mcg Caps (Dofetilide) .[name]... 1 two times a day  Orders: EKG w/ Interpretation (93000)  Problem # 2:  HYPERTENSION, BENIGN (ICD-401.1) Blood pressure much better controlled  The following medications were removed from the medication list:    Micardis Hct 80-12.5 Mg Tabs (Telmisartan-hctz) .[name]... Take one tablet once daily Her updated medication list for this problem includes:    Metoprolol Succinate 50 Mg Xr24h-tab (Metoprolol succinate) .[name]... Take one tablet by mouth daily    Cardizem 120 Mg Tabs (Diltiazem hcl) .[name]... Take 1 capsule by mouth once a day    Micardis 80 Mg Tabs (Telmisartan) .[name]... Take one tablet in the evening    Spironolactone 25 Mg Tabs (Spironolactone) .[name]... Take one half  tablet by mouth daily  The following medications were removed from the medication list:    Micardis Hct 80-12.5 Mg Tabs (Telmisartan-hctz) .[name]... Take one tablet once daily Her updated medication list for this problem includes:    Metoprolol Succinate 100 Mg Xr24h-tab (Metoprolol succinate) .[name]... Take 1 tablet by mouth once a day    Cardizem 120 Mg Tabs (Diltiazem hcl) .[name]... Take 1 capsule by mouth once a day    Micardis 80 Mg Tabs (Telmisartan) .[name]... Take one tablet in the evening    Spironolactone 25 Mg Tabs (Spironolactone) .[name]... Take one half  tablet by mouth daily  Patient Instructions: 1)  Your physician recommends that you schedule a follow-up appointment in: 1 month with Dr. Graciela Husbands 2)  Your physician recommends that you return for lab work in 2 months for a  BMP.401.1/427.31/wpa 3)  Your physician has recommended you make the following change in your medication: DECREASE your Metoprolol to 50mg  by mouth daily.

## 2010-08-28 NOTE — Progress Notes (Signed)
Summary: Pt calling about procedure/elevated BP  Phone Note Call from Patient Call back at Home Phone [phone]   Caller: Patient Summary of Call: Pt returning call back about a procedure Initial call taken by: Judie Grieve,  November 23, 2009 1:09 PM  Follow-up for Phone Call        elevated bp 199/161, elevated hr 103.....does not feel good at all , Migdalia Dk  November 24, 2009 8:11 AM  Spoke with pt who reports BP is elevated as above. Has not rechecked since above reading. Heart rate is now 73 and irregular. She states she is dizzy when she gets up and also having chest tightness. Chest tightness has been going on continuously throughout night. No SOB. States she is taking Micardis/HCT and also Micardis without HCT but cut back on these to one only (taking Micardis with HCT only) for 5 days. Took both yesterday. Also has questions about timing of cardioversion. Will discuss with Amber/Dr. Graciela Husbands. Dossie Arbour, RN, BSN  November 24, 2009 8:50 AM Discussed with Joice Lofts. Pt needs to go to hospital. Spoke with pt who has rechecked BP and it is now 198/151. Instructed her to call EMS and be transported to hospital. She does not want to call EMS but states her daughter is at the house with her and will take her to the hospital.  Will notify ED Follow-up by: Dossie Arbour, RN, BSN,  November 24, 2009 9:02 AM

## 2010-08-28 NOTE — Progress Notes (Signed)
Summary: speak to nurse/dr  Phone Note Call from Patient Call back at Home Phone [phone]   Caller: Patient Reason for Call: Talk to Nurse, Talk to Doctor Summary of Call: request call.... does not know whats going about the cardioversion and meds Initial call taken by: Migdalia Dk,  Dec 26, 2009 11:46 AM  Follow-up for Phone Call        Pt returning call regarding cardioversion Judie Grieve  December 27, 2009 8:12 AM  Additional Follow-up for Phone Call Additional follow up Details #1::        Spoke with patient.  Reviewed DCCV instructions.  She had stopped taking Flecainide because she felt better like she was in normal rhythm.  Advised the Flecainide would keep her in rhythm, but only occassionally converted without DCCV.  She will resume Flecainide and come in tomorrow morning for EKG before DCCV to verify rhythm.  Pt aware and agrees with plan. Gypsy Balsam RN BSN  December 27, 2009 12:38 PM

## 2010-08-28 NOTE — Medication Information (Signed)
Summary: rov/tm  Anticoagulant Therapy  Managed by: Jeralene Peters, PharmD Referring MD: Clifton James MD, Sherrilyn Rist MD: Shirlee Latch MD, Helem Reesor Indication 1: Atrial Fibrillation Lab Used: [facility] [place] Site: [address] INR POC 1.3 INR RANGE 2.0-3.0  Dietary changes: no    Health status changes: yes       Details: having chest pain and tightness x 3 days  Bleeding/hemorrhagic complications: no    Recent/future hospitalizations: no    Any changes in medication regimen? yes       Details: Mucinex-D for allergies  Recent/future dental: no  Any missed doses?: no       Is patient compliant with meds? yes      Comments: Triage nurse to evaluate chest pain today  Current Medications (verified): 1)  Metoprolol Succinate 50 Mg Xr24h-Tab (Metoprolol Succinate) .... Take 1.5 Tablet By Mouth Two Times A Day 2)  Zocor 20 Mg Tabs (Simvastatin) .[name].. 1 Tab At Bedtime 3)  Levoxyl 50 Mcg Tabs (Levothyroxine Sodium) .[name].. 1 Tab Once Daily 4)  Allegra 180 Mg Tabs (Fexofenadine Hcl) .[name].. 1 Tab Once Daily 5)  Aspirin 81 Mg Tbec (Aspirin) .... Take One Tablet By Mouth Daily 6)  Vitamin D 2000 Unit Tabs (Cholecalciferol) .[name].. 1 Tab Once Daily 7)  Protonix 40 Mg Tbec (Pantoprazole Sodium) .[name].. 1 Tab Once Daily 8)  Fish Oil 1000 Mg Caps (Omega-3 Fatty Acids) .... Once Daily 9)  Multivitamins  Tabs (Multiple Vitamin) .... Once Daily 10)  Icaps  Caps (Multiple Vitamins-Minerals) .... Take 1 Daily 11)  Warfarin Sodium 2.5 Mg Tabs (Warfarin Sodium) .... Use As Directed By Anticoagualtion Clinic 12)  Twynsta 80-5 Mg Tabs (Telmisartan-Amlodipine) .... Take 1 Tablet Daily 13)  Mucinex Dm 30-600 Mg Xr12h-Tab (Dextromethorphan-Guaifenesin) .... Prn  Allergies (verified): 1)  ! Sulfa  Anticoagulation Management History:      The patient is taking warfarin and comes in today for a routine follow up visit.  Positive risk factors for bleeding include an age of 1 years or older.   The bleeding index is 'intermediate risk'.  Positive CHADS2 values include Age > 35 years old.  Anticoagulation responsible provider: Shirlee Latch MD, Giannamarie Paulus.  INR POC: 1.3.  Cuvette Lot#: [id number].  Exp: 11/2010.    Anticoagulation Management Assessment/Plan:      The patient's current anticoagulation dose is Warfarin sodium 2.5 mg tabs: Use as directed by Anticoagualtion Clinic.  The target INR is 2.0-3.0.  The next INR is due 10/24/2009.  Anticoagulation instructions were given to patient.  Results were reviewed/authorized by Jeralene Peters, PharmD.         Prior Anticoagulation Instructions: INR 4.0 Skip today's dose of warfarin then change dose to 1/2 pill everyday. Recheck in 5 days.   Current Anticoagulation Instructions: INR 1.3  Start taking 1 tablet on Mondays and 1/2 tablet all other days of the week.  Recheck in 1 week.

## 2010-08-28 NOTE — Assessment & Plan Note (Signed)
Summary: eph/ appt is 1:00 p.m. per dr. klein./ gd   CC:  eph/ pt states she is still having chest tightness.  This is happening quite often.  Pt has not been taking the prescribed Diltiazem due to the way it makes her feel.  History of Present Illness: [name] is seen in followup for atrial tachycardia. she was seen in a hospital where the beta blockers were discontinued because of fatigue and calcium blockers were initiated.  however, she did not tolerate the diltiazem.  She comes in today with a diffuse chest discomfort. It has been there nonstop for 2 or 3 days. It is aggravated by taking deep breaths. There is no accompanying shortness of breath. She does have a history of GE reflux disease for which she takes a PPI.  faithfully there are no more palpitations. She is taking metoprolol at a slightly higher dose.  Current Medications (verified): 1)  Metoprolol Succinate 25 Mg Xr24h-Tab (Metoprolol Succinate) .[name].. 1 Tab Two Times A Day 2)  Zocor 20 Mg Tabs (Simvastatin) .[name].. 1 Tab At Bedtime 3)  Levoxyl 50 Mcg Tabs (Levothyroxine Sodium) .[name].. 1 Tab Once Daily 4)  Allegra 180 Mg Tabs (Fexofenadine Hcl) .[name].. 1 Tab Once Daily 5)  Ocuvite  Tabs (Multiple Vitamins-Minerals) .[name].. 1 Tab Once Daily 6)  Aspirin 81 Mg Tbec (Aspirin) .... Take One Tablet By Mouth Daily 7)  Vitamin D 2000 Unit Tabs (Cholecalciferol) .[name].. 1 Tab Once Daily 8)  Protonix 40 Mg Tbec (Pantoprazole Sodium) .[name].. 1 Tab Once Daily 9)  Losartan Potassium 100 Mg Tabs (Losartan Potassium) .[name].. 1 Tab Once Daily 10)  Fish Oil 1000 Mg Caps (Omega-3 Fatty Acids) .... Once Daily 11)  Multivitamins  Tabs (Multiple Vitamin) .... Once Daily  Allergies (verified): 1)  ! Sulfa  Past History:  Past Medical History: Last updated: 2009/10/02 1.  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     The patient reports having 1 Holter monitor in the past as        recently as this past summer with  [facility].   2. History of chest pain in the setting of palpitations.       a.     The patient reports normal catheterization about 10 years        ago, performed by Dr. Aleen Campi.       b.     Normal stress test in 2010, performed by Dr. Reyes Ivan.   3. Hypertension.   4. Hypothyroidism.   5. Hyperlipidemia.   6. Gastroesophageal reflux disease.   7. Seasonal allergies.   Past Surgical History: Last updated: 10-02-09  1. Status post hysterectomy.   2. Status post laparoscopic bilateral salpingo-oophorectomy.   3. Knee surgery-right, arthroscopic  Family History: Last updated: 10/02/09 Mother-101 when she died Father-died age 86 from cardiac issues 2 children both alive and healthy  Social History: Last updated: 2009/10/02 Married and lives here in [place] with her husband.  She has two children, two grandchilren and three great grandchildren. She does not smoke, use drugs or alcohol.  Does not work outside the home Enjoys reading and going to the beach  Vital Signs:  Patient profile:   77 year old female Height:      62.5 inches Weight:      134 pounds BMI:     24.21 Pulse rate:   68 / minute Pulse rhythm:   irregular  Vitals Entered By: Judithe Modest CMA (  September 18, 2009 1:26 PM)  Physical Exam  General:  The patient was alert and oriented in no acute distress. HEENT Normal.  Neck veins were flat, carotids were brisk.  Lungs were clear.  Heart sounds were regular without murmurs or gallops.  Abdomen was soft with active bowel sounds. There is no clubbing cyanosis or edema. Skin Warm and dry    EKG  Procedure date:  09/18/2009  Findings:      sinus rhythm at 68 Intervals 0.18/0.07/0.40 Axis is -4 Q Waves in V1 and V2 This is unchanged from February 2011  Impression & Recommendations:  Problem # 1:  CHEST PAIN UNSPECIFIED (ICD-786.50) Ma noncardiac chest pain. With its persistence I wonder if it's not a manifestation of worsening  GE reflux disease. I suggested that she take Pepcid or Zantac over-the-counter and if her symptoms persist for more than another few days and she should follow up with her primary care physician.  We will check a d-dimer today as well as troponin level today shows no underlying cardiac contributions to this. Her updated medication list for this problem includes:    Metoprolol Succinate 25 Mg Xr24h-tab (Metoprolol succinate) .[name]... 1 tab two times a day    Aspirin 81 Mg Tbec (Aspirin) .[name]... Take one tablet by mouth daily  Orders: TLB-BNP (B-Natriuretic Peptide) (83880-BNPR) T-D-Dimer Fibrin Derivatives Quantitive [phone])  Her updated medication list for this problem includes:    Metoprolol Succinate 25 Mg Xr24h-tab (Metoprolol succinate) .[name]... 1 tab two times a day    Aspirin 81 Mg Tbec (Aspirin) .[name]... Take one tablet by mouth daily  Problem # 2:  SVT/ PSVT/ PAT (ICD-427.0) tachycardia palpitations are currently well controlled Her updated medication list for this problem includes:    Metoprolol Succinate 25 Mg Xr24h-tab (Metoprolol succinate) .[name]... 1 tab two times a day    Aspirin 81 Mg Tbec (Aspirin) .[name]... Take one tablet by mouth daily  Orders: EKG w/ Interpretation (93000) TLB-BNP (B-Natriuretic Peptide) (83880-BNPR) T-D-Dimer Fibrin Derivatives Quantitive ([phone])  Patient Instructions: 1)  Your physician recommends that you HAVE LABS TODAY: BNP AND D-DIMER 2)  Your physician recommends that you schedule a follow-up appointment in: 6 MONTHS

## 2010-08-28 NOTE — Medication Information (Signed)
Summary: rov/cb  Anticoagulant Therapy  Managed by: Weston Brass, PharmD Referring MD: Clifton James MD, Cristal Deer PCP: Hunt Oris MD: Eden Emms MD, Theron Arista Indication 1: Atrial Fibrillation Lab Used: [facility]  Site: [address] INR POC 2.3 INR RANGE 2.0-3.0    Bleeding/hemorrhagic complications: yes       Details: has busted blood vessel in eye.  No change in vision.  Instructed pt to keep watching.  If it becomes painful or vision changes, call optomistrist.   Recent/future hospitalizations: no    Any changes in medication regimen? no    Recent/future dental: no  Any missed doses?: no       Is patient compliant with meds? yes       Allergies: 1)  ! Sulfa  Anticoagulation Management History:      The patient is taking warfarin and comes in today for a routine follow up visit.  Positive risk factors for bleeding include an age of 75 years or older.  The bleeding index is 'intermediate risk'.  Positive CHADS2 values include History of HTN and Age > 28 years old.  Her last INR was 5.1 ratio.  Anticoagulation responsible provider: Eden Emms MD, Theron Arista.  INR POC: 2.3.  Cuvette Lot#: [id number].  Exp: 03/2011.    Anticoagulation Management Assessment/Plan:      The patient's current anticoagulation dose is Warfarin sodium 2.5 mg tabs: Use as directed by Anticoagualtion Clinic.  The target INR is 2.0-3.0.  The next INR is due 01/30/2010.  Anticoagulation instructions were given to patient.  Results were reviewed/authorized by Weston Brass, PharmD.  She was notified by Weston Brass PharmD.         Prior Anticoagulation Instructions: INR 3.0. Take 1 tablet daily.  Recheck in 2 weeks.  Current Anticoagulation Instructions: INR 2.3  Continue same dose of 1 tablet every day.

## 2010-08-28 NOTE — Letter (Signed)
Summary: Cardioversion/TEE Instructions  Architectural technologist, Main Office  [address]   [place], [place] [address]   Phone: [phone]  Fax: [phone]    Cardioversion Instructions  You are scheduled for a Cardioversion on Thursday, Dec 07, 2009 at 2:00 PM with Dr. Graciela Husbands.   Please arrive at the [facility] at 12:00 NOON on the day of your procedure.  1)   DIET:         Do not eat anything heavy after midnight the night prior to your procedure.    You may have clear liquid breakfast, then nothing to eat or drink after 8:00 a.m.       Clear liquids include:  water, broth, Sprite, Ginger Ale, black coffee, tea (no sugar),      cranberry / grape / apple juice, jello (not red), popsicle from clear juices (not red).   2)   Come to the [place] office on Wednesday, Dec 06, 2009 in the morning for lab work. The lab at [facility] is open from 8:30 a.m. to 1:30 p.m. and 2:30 p.m. to 5:00 p.m. The lab at [facility] is open from 7:30 a.m. to 5:30 p.m. You do not have to be fasting.   3)   MAKE SURE YOU TAKE YOUR COUMADIN.   4)   YOU MAY TAKE ALL of your medications with a small amount of water.   5)  You must have a responsible person to drive you home.  6)   Bring a current list of your medications and current insurance cards.   * Special Note:  Every effort is made to have your procedure done on time. Occasionally there are emergencies that present themselves at the hospital that may cause delays. Please be patient if a delay does occur.  * If you have any questions after you get home, please call the office at [phone].

## 2010-08-28 NOTE — Medication Information (Signed)
Summary: ccr/mt  Anticoagulant Therapy  Managed by: Elaina Pattee, PharmD Referring MD: Clifton James MD, Cristal Deer PCP: Hunt Oris MD: Juanda Chance MD, Laury Huizar Indication 1: Atrial Fibrillation Lab Used: [facility] [place] Site: [address] INR RANGE 2.0-3.0  Dietary changes: no    Health status changes: no    Bleeding/hemorrhagic complications: no    Recent/future hospitalizations: no    Any changes in medication regimen? yes       Details: DC Flecainide.  Recent/future dental: no  Any missed doses?: no       Is patient compliant with meds? yes       Allergies: 1)  ! Sulfa  Anticoagulation Management History:      The patient is taking warfarin and comes in today for a routine follow up visit.  Positive risk factors for bleeding include an age of 75 years or older.  The bleeding index is 'intermediate risk'.  Positive CHADS2 values include History of HTN and Age > 75 years old.  Her last INR was 5.1 ratio.  Anticoagulation responsible provider: Juanda Chance MD, Smitty Cords.  Cuvette Lot#: [id number].  Exp: 02/2011.    Anticoagulation Management Assessment/Plan:      The patient's current anticoagulation dose is Warfarin sodium 2.5 mg tabs: Use as directed by Anticoagualtion Clinic.  The target INR is 2.0-3.0.  The next INR is due 01/17/2010.  Anticoagulation instructions were given to patient.  Results were reviewed/authorized by Elaina Pattee, PharmD.  She was notified by Elaina Pattee, PharmD.         Prior Anticoagulation Instructions: INR 4.0  Skip today's dose of Coumadin then resume same dose of 1 tablet every day except 2 tablets on Tuesday.   Current Anticoagulation Instructions: INR 3.0. Take 1 tablet daily.  Recheck in 2 weeks.

## 2010-08-28 NOTE — Medication Information (Signed)
Summary: Coumadin Clinic  Anticoagulant Therapy  Managed by: Weston Brass, PharmD Referring MD: Clifton James MD, Sherrilyn Rist MD: Ladona Ridgel MD, Sharlot Gowda Indication 1: Atrial Fibrillation Lab Used: [facility] [place] Site: [address] INR POC 5.1 INR RANGE 2.0-3.0    Bleeding/hemorrhagic complications: yes       Details: Pt fell 2 days ago.  Was seen by RN today to evaluate bruising  Recent/future hospitalizations: yes       Details: pending cardioversion tomorrow       Comments: Spoke with Amber, will plan to adjust Coumadin as normal and proceed with cardioversion tomorrow.   Allergies: 1)  ! Sulfa  Anticoagulation Management History:      Her anticoagulation is being managed by telephone today.  Positive risk factors for bleeding include an age of 20 years or older.  The bleeding index is 'intermediate risk'.  Positive CHADS2 values include History of HTN and Age > 78 years old.  Her last INR was 5.1 ratio.  Anticoagulation responsible provider: Ladona Ridgel MD, Sharlot Gowda.  INR POC: 5.1.  Exp: 12/2010.    Anticoagulation Management Assessment/Plan:      The patient's current anticoagulation dose is Warfarin sodium 2.5 mg tabs: Use as directed by Anticoagualtion Clinic.  The target INR is 2.0-3.0.  The next INR is due 12/12/2009.  Anticoagulation instructions were given to patient.  Results were reviewed/authorized by Weston Brass, PharmD.  She was notified by Weston Brass PharmD.         Prior Anticoagulation Instructions: INR 2.7 Pending DCCV next week, pre-op labs to be done.  Continue 1 pill everyday except 2 pills on Tuesdays. Recheck in two weeks.   Current Anticoagulation Instructions: INR 5.1  LMOM for pt.  Instructed her to hold her Coumadin today and tomorrow then resume normal dose of Coumadin which is 2.5mg  daily except 5mg  on Tuesday.  She is to report to the hospital as planned tomorrow for cardioversion.  Her next appt with Coumadin clinic is Tuesday  3/17

## 2010-08-28 NOTE — Medication Information (Signed)
Summary: rov/sp  Anticoagulant Therapy  Managed by: Lyna Poser, PharmD Referring MD: Clifton James MD, Cristal Deer PCP: Hunt Oris MD: Clifton James MD,Christopher Indication 1: Atrial Fibrillation Lab Used: [facility] [place] Site: [address] INR POC 2.1 INR RANGE 2.0-3.0  Dietary changes: no    Health status changes: no    Bleeding/hemorrhagic complications: no    Recent/future hospitalizations: no    Any changes in medication regimen? yes       Details: stopped taking micardis and simvastatin   Recent/future dental: no  Any missed doses?: yes     Details: took a whole tablet yesterday instead of a 1/2  Is patient compliant with meds? yes       Allergies: 1)  ! Sulfa 2)  ! Pradaxa (Dabigatran Etexilate Mesylate)  Anticoagulation Management History:      The patient is taking warfarin and comes in today for a routine follow up visit.  Positive risk factors for bleeding include an age of 75 years or older.  The bleeding index is 'intermediate risk'.  Positive CHADS2 values include History of HTN and Age > 61 years old.  Her last INR was 2.7 ratio.  Anticoagulation responsible provider: Clifton James MD,Christopher.  INR POC: 2.1.  Cuvette Lot#: [id number].  Exp: 04/2011.    Anticoagulation Management Assessment/Plan:      The patient's current anticoagulation dose is Warfarin sodium 2.5 mg tabs: Use as directed by Anticoagualtion Clinic.  The target INR is 2.0-3.0.  The next INR is due 06/26/2010.  Anticoagulation instructions were given to patient.  Results were reviewed/authorized by Lyna Poser, PharmD.  She was notified by Lyna Poser PharmD.         Prior Anticoagulation Instructions: INR 2.1  Continue same dose of 1 tablet every day except 1/2 tablet on Monday.  Recheck INR in 4 weeks.   Current Anticoagulation Instructions: INR 2.1 Continue taking a half tablet on monday. And take 1 tablet all other days. Recheck in 4 weeks.

## 2010-08-28 NOTE — Progress Notes (Signed)
  Faxed ROI over to [facility] to fax [phone] [facility]  September 05, 2009 9:23 AM     Appended Document:  Recieved Records from [facility] forwarded to [place].Marland KitchenKm

## 2010-08-28 NOTE — Assessment & Plan Note (Signed)
Summary: [name]   Visit Type:  Follow-up  CC:  chest pain.  History of Present Illness: 77 yo WF with history of HTN, hyperlipidemia, GERD with recent admission to [facility] 08/22/09 with palpitations and chest pain. She was seen by Dr. Shirlee Latch in consultation and then seen by Dr. Graciela Husbands for further management of her dysrhythmia. During the hospitalization, she was found to have  atrial tachycardia and junctional tachycardia with PVCs. She was started on Cardizem and beta blockade with resolution of symptoms prior to discharge. Dr. Graciela Husbands talked with her about an EP study and possible anti-arrhythmic therapy. Ultimately, she went home on both Cardizem and Toprol but called in to our office last week and stated that she was having chest tightness. Dr. Graciela Husbands suggested that she stop the Cardizem and continue the Toprol. She has made this change but continues to have constant substernal chest pain. She has had no palpitations, SOB, near syncope or syncope. Her chest pain does not change with exertion. It is not positional.   Of note, she has been followed in the past by Dr. Aleen Campi who performed a left heart cath 10 years ago that was reportedly normal. Dr. Reyes Ivan has followed her over the last year in [facility] and performed a stress test that was normal per the patient. She has no diagnosis of CAD.   Current Medications (verified): 1)  Metoprolol Succinate 25 Mg Xr24h-Tab (Metoprolol Succinate) .[name].. 1 Tab Two Times A Day 2)  Zocor 20 Mg Tabs (Simvastatin) .[name].. 1 Tab At Bedtime 3)  Levoxyl 50 Mcg Tabs (Levothyroxine Sodium) .[name].. 1 Tab Once Daily 4)  Allegra 180 Mg Tabs (Fexofenadine Hcl) .[name].. 1 Tab Once Daily 5)  Ocuvite  Tabs (Multiple Vitamins-Minerals) .[name].. 1 Tab Once Daily 6)  Aspirin 81 Mg Tbec (Aspirin) .... Take One Tablet By Mouth Daily 7)  Vitamin D 2000 Unit Tabs (Cholecalciferol) .[name].. 1 Tab Once Daily 8)  Protonix 40 Mg Tbec (Pantoprazole Sodium) .[name].. 1 Tab Once Daily 9)   Losartan Potassium 100 Mg Tabs (Losartan Potassium) .[name].. 1 Tab Once Daily  Allergies (verified): 1)  ! Sulfa  Past History:  Past Medical History: 1.  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     The patient reports having 1 Holter monitor in the past as        recently as this past summer with [facility].   2. History of chest pain in the setting of palpitations.       a.     The patient reports normal catheterization about 10 years        ago, performed by Dr. Aleen Campi.       b.     Normal stress test in 2010, performed by Dr. Reyes Ivan.   3. Hypertension.   4. Hypothyroidism.   5. Hyperlipidemia.   6. Gastroesophageal reflux disease.   7. Seasonal allergies.   Past Surgical History:  1. Status post hysterectomy.   2. Status post laparoscopic bilateral salpingo-oophorectomy.   3. Knee surgery-right, arthroscopic  Family History: Mother-101 when she died Father-died age 57 from cardiac issues 2 children both alive and healthy  Social History: Married and lives here in [place] with her husband.  She has two children, two grandchilren and three great grandchildren. She does not smoke, use drugs or alcohol.  Does not work outside the home Enjoys reading and going to [employer]  Review of Systems  The patient complains of chest pain and palpitations.  The patient denies fatigue, malaise, fever, weight gain/loss, vision loss, decreased hearing, hoarseness, shortness of breath, prolonged cough, wheezing, sleep apnea, coughing up blood, abdominal pain, blood in stool, nausea, vomiting, diarrhea, heartburn, incontinence, blood in urine, muscle weakness, joint pain, leg swelling, rash, skin lesions, headache, fainting, dizziness, depression, anxiety, enlarged lymph nodes, easy bruising or bleeding, and environmental allergies.    Vital Signs:  Patient profile:   77 year old female Height:      135 inches Weight:      65.5  pounds BMI:     2.54 Pulse rate:   66 / minute Pulse rhythm:   regular BP sitting:   144 / 80  (right arm) Cuff size:   regular  Vitals Entered By: Danielle Rankin, CMA (September 04, 2009 12:27 PM)  Physical Exam  General:  General: Well developed, well nourished, NAD HEENT: OP clear, mucus membranes moist SKIN: warm, dry Neuro: No focal deficits Musculoskeletal: Muscle strength 5/5 all ext Psychiatric: Mood and affect normal Neck: No JVD, no carotid bruits, no thyromegaly, no lymphadenopathy. Lungs:Clear bilaterally, no wheezes, rhonci, crackles CV: RRR no murmurs, gallops rubs Abdomen: soft, NT, ND, BS present Extremities: No edema, pulses 1-2+.    Echocardiogram  Procedure date:  08/23/2009  Findings:      - Left ventricle: The cavity size was normal. There was mild focal     basal hypertrophy of the septum. Systolic function was normal. The     estimated ejection fraction was in the range of 55% to 60%. Wall     motion was normal; there were no regional wall motion     abnormalities.   - Mitral valve: Mild regurgitation.   - Left atrium: The atrium was moderately dilated.   - Right atrium: The atrium was mildly dilated.   - Pulmonary arteries: Systolic pressure was mildly to moderately     increased. PA peak pressure: 45mm Hg (S).  EKG  Procedure date:  09/04/2009  Findings:      NSR, rate 66 bpm. Possible old septal infarct.   Impression & Recommendations:  Problem # 1:  SVT/ PSVT/ PAT (ICD-427.0) Telemetry in the hospital showed atrial tachycardia and junctional tachycardia.  She is currently not having palpitatoins on Toprol XL 25 mg by mouth two times a day. She will ultimately need a more definitive plan for her atrial tachycardia. She already has follow up with Dr. Graciela Husbands who saw her in the hospital last month. Further plans can be made at that visit for her arrythmia management. I will attempt to get all of her records from [facility] cardiology in the meantime. She  will follow with Dr. Graciela Husbands in the future.   Her updated medication list for this problem includes:    Metoprolol Succinate 25 Mg Xr24h-tab (Metoprolol succinate) .[name]... 1 tab two times a day    Aspirin 81 Mg Tbec (Aspirin) .[name]... Take one tablet by mouth daily  Problem # 2:  CHEST PAIN UNSPECIFIED (ICD-786.50) Atypical. Recent negative workup with stress test May 2010 at [facility] (Dr. Reyes Ivan). I will get the records of the stress test. Her pain does not sound typical for obstructive CAD. It also does not seem to be associated with her palpitations and arrythmias. I do not think that further ischemic testing is indicated at this time.   Her updated medication list for this problem includes:    Metoprolol Succinate 25 Mg Xr24h-tab (Metoprolol succinate) .Marland KitchenMarland KitchenMarland KitchenMarland Kitchen  1 tab two times a day    Aspirin 81 Mg Tbec (Aspirin) .[name]... Take one tablet by mouth daily  Patient Instructions: 1)  Your physician recommends that you schedule a follow-up appointment as needed with Dr. Clifton James.  Keep appointment with Dr. Graciela Husbands on February 21 at 1:15:  2)  Your physician recommends that you continue on your current medications as directed. Please refer to the Current Medication list given to you today.

## 2010-08-28 NOTE — Consult Note (Signed)
Summary: [facility]  [facility]   Imported By: Marylou Mccoy 10/17/2009 13:04:15  _____________________________________________________________________  External Attachment:    Type:   Image     Comment:   External Document

## 2010-08-28 NOTE — Medication Information (Signed)
Summary: rov/sp  Anticoagulant Therapy  Managed by: Reina Fuse, PharmD Referring MD: Clifton James MD, Cristal Deer PCP: Hunt Oris MD: Jens Som MD, Arlys John Indication 1: Atrial Fibrillation Lab Used: [facility] [place] Site: [address] INR POC 1.8 INR RANGE 2.0-3.0  Dietary changes: no    Health status changes: no    Bleeding/hemorrhagic complications: no    Recent/future hospitalizations: no    Any changes in medication regimen? no    Recent/future dental: no  Any missed doses?: no       Is patient compliant with meds? yes       Current Medications (verified): 1)  Metoprolol Succinate 100 Mg Xr24h-Tab (Metoprolol Succinate) .... Take 1 Tablet By Mouth Once A Day 2)  Zocor 20 Mg Tabs (Simvastatin) .[name].. 1 Tab At Bedtime 3)  Levoxyl 50 Mcg Tabs (Levothyroxine Sodium) .[name].. 1 Tab Once Daily 4)  Allegra 180 Mg Tabs (Fexofenadine Hcl) .[name].. 1 Tab Once Daily 5)  Vitamin D 2000 Unit Tabs (Cholecalciferol) .[name].. 1 Tab Once Daily 6)  Protonix 40 Mg Tbec (Pantoprazole Sodium) .[name].. 1 Tab Once Daily 7)  Fish Oil 1000 Mg Caps (Omega-3 Fatty Acids) .... Once Daily 8)  Multivitamins  Tabs (Multiple Vitamin) .... Once Daily 9)  Icaps  Caps (Multiple Vitamins-Minerals) .... Take 1 Daily 10)  Mucinex Dm 30-600 Mg Xr12h-Tab (Dextromethorphan-Guaifenesin) .... Prn 11)  Micardis Hct 80-12.5 Mg Tabs (Telmisartan-Hctz) .... Take One Tablet Once Daily 12)  Alprazolam 0.25 Mg Tabs (Alprazolam) .... Take 1 Tablet By Mouth At Bedtime As Needed For Sleep 13)  Cardizem 120 Mg Tabs (Diltiazem Hcl) .... Take 1 Capsule By Mouth Once A Day 14)  Micardis 80 Mg Tabs (Telmisartan) .... Take One Tablet in The Evening 15)  Warfarin Sodium 2.5 Mg Tabs (Warfarin Sodium) .... Use As Directed By Anticoagualtion Clinic  Allergies (verified): 1)  ! Sulfa  Anticoagulation Management History:      The patient is taking warfarin and comes in today for a routine follow up visit.  Positive risk  factors for bleeding include an age of 75 years or older.  The bleeding index is 'intermediate risk'.  Positive CHADS2 values include History of HTN and Age > 8 years old.  Her last INR was 5.1 ratio.  Anticoagulation responsible provider: Jens Som MD, Arlys John.  INR POC: 1.8.  Cuvette Lot#: [id number].  Exp: 04/2011.    Anticoagulation Management Assessment/Plan:      The patient's current anticoagulation dose is Warfarin sodium 2.5 mg tabs: Use as directed by Anticoagualtion Clinic.  The target INR is 2.0-3.0.  The next INR is due 03/28/2010.  Anticoagulation instructions were given to patient.  Results were reviewed/authorized by Reina Fuse, PharmD.  She was notified by Reina Fuse PharmD.         Prior Anticoagulation Instructions: INR 1.7  Take 1 1/2 tablets today then resume same dose of 1 tablet every day except 1/2 tablet on Monday and Friday.   Current Anticoagulation Instructions: INR 1.8  Today, August 10th, take Coumadin 2 tablets (5 mg).  Then Coumadin 1 tab (2.5 mg) all days except for Coumadin 0.5 (1.25 mg) on Mondays. Return to clinic in 3 weeks.

## 2010-08-28 NOTE — Progress Notes (Signed)
Summary: pt at hospital   Phone Note Call from Patient Call back at Home Phone [phone]   Caller: Spouse Reason for Call: Talk to Nurse Summary of Call: per pt hubsand calling back , pt at hospital.  Initial call taken by: Lorne Skeens,  November 24, 2009 3:28 PM  Follow-up for Phone Call        Line busy. Gypsy Balsam RN BSN  November 24, 2009 3:43 PM  Called patient and left message on machine [employer] RN BSN  November 24, 2009 6:30 PM

## 2010-08-28 NOTE — Progress Notes (Signed)
  Phone Note Outgoing Call   Call placed by: Scherrie Bateman, LPN,  March 30, 2010 2:26 PM Call placed to: Patient Summary of Call: PT AWARE PER MAIL ORDER PHARMACY TIKOSYN WILL BE DELIVERED NEXT DAY DELIVERY. Initial call taken by: Scherrie Bateman, LPN,  March 30, 2010 2:26 PM

## 2010-08-28 NOTE — Assessment & Plan Note (Signed)
Summary: ekg/verify rhythm pre DCCV/amber  Nurse Visit   Vital Signs:  Patient profile:   77 year old female BP sitting:   130 / 80  (left arm)  Impression & Recommendations: Patient came by today for EKG to check rhythm prior to scheduled cardioversion for today. Reviewed by Dr.Klein .Marland KitchenMarland KitchenMarland KitchenEKG showed sinus bradycardia so he ordered to cancell cardioversion for today and to stop Flecainide. She will return on 6/13 for anoth EKG to check PR interval and also made her follow up appointments with the coumadin clinic and to see Dr.Klein again 1st week of July. Trish the cardmaster notified of the cancellation of the cardioversion and she will notify scheduled MD and the short stay center at [facility].    Allergies: 1)  ! Sulfa

## 2010-08-28 NOTE — Letter (Signed)
Summary: [facility]  [facility]   Imported By: Roderic Ovens 10/04/2009 16:17:49  _____________________________________________________________________  External Attachment:    Type:   Image     Comment:   External Document

## 2010-08-28 NOTE — Consult Note (Signed)
[facility]   Imported By: Roderic Ovens 08/25/2009 14:28:52  _____________________________________________________________________  External Attachment:    Type:   Image     Comment:   External Document

## 2010-08-28 NOTE — Medication Information (Signed)
Summary: rov/sl  Anticoagulant Therapy  Managed by: Reina Fuse, PharmD Referring MD: Clifton James MD, Cristal Deer PCP: Hunt Oris MD: Eden Emms MD,Peter Indication 1: Atrial Fibrillation Lab Used: [facility] [place] Site: [address] INR POC 2.7 INR RANGE 2.0-3.0  Dietary changes: no    Health status changes: no    Bleeding/hemorrhagic complications: yes       Details: right eye  Recent/future hospitalizations: yes       Details: started tikosyn  Any changes in medication regimen? yes       Details: started taking tikosyn 1 week ago, stopped taking micardis hctz, just taking micardis  Recent/future dental: no  Any missed doses?: no       Is patient compliant with meds? yes       Allergies: 1)  ! Sulfa  Anticoagulation Management History:      The patient is taking warfarin and comes in today for a routine follow up visit.  Positive risk factors for bleeding include an age of 75 years or older.  The bleeding index is 'intermediate risk'.  Positive CHADS2 values include History of HTN and Age > 22 years old.  Her last INR was 2.7 ratio.  Anticoagulation responsible [place] Fettes: Eden Emms MD,Peter.  INR POC: 2.7.  Cuvette Lot#: [id number].  Exp: 04/2011.    Anticoagulation Management Assessment/Plan:      The patient's current anticoagulation dose is Warfarin sodium 2.5 mg tabs: Use as directed by Anticoagualtion Clinic.  The target INR is 2.0-3.0.  The next INR is due 05/01/2010.  Anticoagulation instructions were given to patient.  Results were reviewed/authorized by Reina Fuse, PharmD.  She was notified by Lyna Poser PharmD.         Prior Anticoagulation Instructions: INR 1.8  Today, August 10th, take Coumadin 2 tablets (5 mg).  Then Coumadin 1 tab (2.5 mg) all days except for Coumadin 0.5 (1.25 mg) on Mondays. Return to clinic in 3 weeks.   Current Anticoagulation Instructions: INR 2.7  Continue taking 1 tablet on all days except monday take 1/2 tablet. See  you in 4 weeks.

## 2010-08-28 NOTE — Medication Information (Signed)
Summary: rov/eac  Anticoagulant Therapy  Managed by: Bethena Midget, RN, BSN Referring MD: Clifton James MD, Sherrilyn Rist MD: Graciela Husbands MD, Viviann Spare Indication 1: Atrial Fibrillation Lab Used: [facility] [place] Site: [address] INR POC 1.3 INR RANGE 2.0-3.0  Dietary changes: no    Health status changes: yes       Details: See below  Bleeding/hemorrhagic complications: no    Recent/future hospitalizations: no    Any changes in medication regimen? no    Recent/future dental: no  Any missed doses?: no       Is patient compliant with meds? yes      Comments: Was in hosp. 3 weeks. ago and has had chest pain and SOB since discharge. Seeing Dr. Graciela Husbands today.  3  Allergies (verified): 1)  ! Sulfa  Anticoagulation Management History:      The patient is taking warfarin and comes in today for a routine follow up visit.  Positive risk factors for bleeding include an age of 75 years or older.  The bleeding index is 'intermediate risk'.  Positive CHADS2 values include Age > 48 years old.  Anticoagulation responsible provider: Graciela Husbands MD, Viviann Spare.  INR POC: 1.3.  Cuvette Lot#: [id number].  Exp: 11/2010.    Anticoagulation Management Assessment/Plan:      The patient's current anticoagulation dose is Warfarin sodium 2.5 mg tabs: Use as directed by Anticoagualtion Clinic.  The target INR is 2.0-3.0.  The next INR is due 11/07/2009.  Anticoagulation instructions were given to patient.  Results were reviewed/authorized by Bethena Midget, RN, BSN.  She was notified by Bethena Midget, RN, BSN.         Prior Anticoagulation Instructions: INR 1.1  Start NEW dosing schedule of 1 tablet on Monday, Wednesday, and Friday, and 1/2 tablet all other days.  Return to clinic in 1 week.   Current Anticoagulation Instructions: INR 1.3 Today 1 pill then change dose to 1 pill everyday except 1/2 pill on Mondays and Fridays. Recheck in 10 days.   Appended Document: Coumadin  Clinic    Anticoagulant Therapy  Managed by: Elaina Pattee, PharmD Referring MD: Clifton James MD, Sherrilyn Rist MD: Graciela Husbands MD, Viviann Spare Indication 1: Atrial Fibrillation Lab Used: [facility] [place] Site: [address] INR RANGE 2.0-3.0     Recent/future hospitalizations: yes       Details: Patient is pending DCCV. Notify Amber when INR >2 x 3 weeks.      Comments: Patient is pending DCCV. Notify Amber when INR >2 x 3 weeks.  Allergies: 1)  ! Sulfa  Anticoagulation Management History:      Positive risk factors for bleeding include an age of 62 years or older.  The bleeding index is 'intermediate risk'.  Positive CHADS2 values include History of HTN and Age > 31 years old.  Anticoagulation responsible provider: Graciela Husbands MD, Viviann Spare.  Exp: 11/2010.    Anticoagulation Management Assessment/Plan:      The patient's current anticoagulation dose is Warfarin sodium 2.5 mg tabs: Use as directed by Anticoagualtion Clinic.  The target INR is 2.0-3.0.  The next INR is due 11/07/2009.  Anticoagulation instructions were given to patient.  Results were reviewed/authorized by Elaina Pattee, PharmD.         Prior Anticoagulation Instructions: INR 1.3 Today 1 pill then change dose to 1 pill everyday except 1/2 pill on Mondays and Fridays. Recheck in 10 days.

## 2010-08-28 NOTE — Assessment & Plan Note (Signed)
Summary: rov/atrial fib/dm   Primary Provider:  Ricki Miller   History of Present Illness:  [name] is  in followup for atrial fibrillation with a variable ventricular response most recently quite rapid.  She has normal left ventricular function and a nonischemic Myoview in 2010. We undertook cardioversion without antiarrhythmic support about 3  months ago She maintained sinus rhythm for just a few days and then reverted to a to fibrillation with a rapid ventricular response prompting repeat hospitalization. We then discussed rate versus rhythm control. Her heart rates were much better controlled on diltiazem and she felt better and she decided to go home. She subsequently found that atrial fibrillation with controlled ventricular response was still quite symptomatic and we began her on flecainide. Unfortunately this is associated with significant PR prolongation prompting its discontinuation about a month ago. When seen last month she was in sinus rhythms and  feels much better in sinus rhythm.  She unfortunately reverted to atrial fibrillation yesterday and feels very weak and washed out.  She reminds me that she is under a great deal of stress from her husband who has Alzheimer's and her daughter who is working and whose granddaughter has now moved in following a divorce    Current Medications (verified): 1)  Metoprolol Succinate 100 Mg Xr24h-Tab (Metoprolol Succinate) .... Take 1 Tablet By Mouth Once A Day 2)  Zocor 20 Mg Tabs (Simvastatin) .[name].. 1 Tab At Bedtime 3)  Levoxyl 50 Mcg Tabs (Levothyroxine Sodium) .[name].. 1 Tab Once Daily 4)  Allegra 180 Mg Tabs (Fexofenadine Hcl) .[name].. 1 Tab Once Daily 5)  Vitamin D 2000 Unit Tabs (Cholecalciferol) .[name].. 1 Tab Once Daily 6)  Protonix 40 Mg Tbec (Pantoprazole Sodium) .[name].. 1 Tab Once Daily 7)  Fish Oil 1000 Mg Caps (Omega-3 Fatty Acids) .... Once Daily 8)  Multivitamins  Tabs (Multiple Vitamin) .... Once Daily 9)  Icaps  Caps (Multiple Vitamins-Minerals)  .... Take 1 Daily 10)  Mucinex Dm 30-600 Mg Xr12h-Tab (Dextromethorphan-Guaifenesin) .... Prn 11)  Micardis Hct 80-12.5 Mg Tabs (Telmisartan-Hctz) .... Take One Tablet Once Daily 12)  Alprazolam 0.25 Mg Tabs (Alprazolam) .... Take 1 Tablet By Mouth At Bedtime As Needed For Sleep 13)  Cardizem 120 Mg Tabs (Diltiazem Hcl) .... Take 1 Capsule By Mouth Once A Day 14)  Micardis 80 Mg Tabs (Telmisartan) .... Take One Tablet in The Evening 15)  Warfarin Sodium 2.5 Mg Tabs (Warfarin Sodium) .... Use As Directed By Anticoagualtion Clinic  Allergies (verified): 1)  ! Sulfa  Past History:  Past Surgical History: Last updated: 09/23/09  1. Status post hysterectomy.   2. Status post laparoscopic bilateral salpingo-oophorectomy.   3. Knee surgery-right, arthroscopic  Family History: Last updated: 2009/09/23 Mother-101 when she died Father-died age 49 from cardiac issues 2 children both alive and healthy  Social History: Last updated: 2009/09/23 Married and lives here in [place] with her husband.  She has two children, two grandchilren and three great grandchildren. She does not smoke, use drugs or alcohol.  Does not work outside the home Enjoys reading and going to [employer]  Past Medical History: atrial fibrillation failure to tolerate Pradaxa secondary to GI symptoms 1st degree AV block aggravated by flecainide 1.  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     The patient reports having 1 Holter monitor in the past as        recently as this past summer with [facility].   2. History  of chest pain in the setting of palpitations.       a.     The patient reports normal catheterization about 10 years        ago, performed by Dr. Aleen Campi.       b.     Normal stress test in 2010, performed by Dr. Reyes Ivan.   3. Hypertension.   4. Hypothyroidism.   5. Hyperlipidemia.   6. Gastroesophageal reflux disease.   7. Seasonal allergies.    Vital Signs:  Patient profile:   77 year old female Height:      62.5 inches Weight:      134 pounds BMI:     24.21 Pulse rate:   62 / minute Resp:     16 per minute BP sitting:   140 / 81  (left arm)  Vitals Entered By: Marrion Coy, CNA (March 15, 2010 4:20 PM)  Physical Exam  General:  The patient was alert and oriented in no acute distress.Neck veins were flat, carotids were brisk. Lungs were clear. Heart sounds were irregular without murmurs or gallops. Abdomen was soft with active bowel sounds. There is no clubbing cyanosis or edema. Tearful as she discuseed her husbands situation   EKG  Procedure date:  03/15/2010  Findings:      atrial fibrillation at 60 Intervals-/08/44 for Poor R-wave progression  Impression & Recommendations:  Problem # 1:  ATRIAL FIBRILLATION (ICD-427.31) the patient has recurrent atrial fibrillation which is quite symptomatic despite rate control. She has had significant problems with bradycardia in the context of 1C antiarrhythmictherapy as well as conduction system disease.  I don't think she is a candidate for either Rythmol or amiodarone because of that. She is also probably not a candidate for sotalol; this leaves tikosyn as our best option. Her QT interval today is 440 ms.  We discussed potential benefits as well as proarrhythmic risk. She is interested in pursuing this if she can arrange her home life adequately. She is to call and let us know. We will assess her potassium and her magnesium levels tomorrow as well as check her INR which has been subtherapeutic.  Previous attempts to use Pradaxa failed because of GI intolerance Her updated medication list for this problem includes:    Metoprolol Succinate 100 Mg Xr24h-tab (Metoprolol succinate) .[name]... Take 1 tablet by mouth once a day    Warfarin Sodium 2.5 Mg Tabs (Warfarin sodium) ..... Use as directed by anticoagualtion clinic  Orders: EKG w/ Interpretation (93000)  Problem # 2:   SICK SINUS/ TACHY-BRADY SYNDROME (ICD-427.81) she is both bradycardia as well as conduction system disease aggravated by flecaninde therapy.  will avoid similar drugs see above Her updated medication list for this problem includes:    Metoprolol Succinate 100 Mg Xr24h-tab (Metoprolol succinate) .[name]... Take 1 tablet by mouth once a day    Cardizem 120 Mg Tabs (Diltiazem hcl) .[name]... Take 1 capsule by mouth once a day    Warfarin Sodium 2.5 Mg Tabs (Warfarin sodium) ..... Use as directed by anticoagualtion clinic  Patient Instructions: 1)  Your physician recommends that you continue on your current medications as directed. Please refer to the Current Medication list given to you today. 2)  Your physician recommends that you return for lab work Friday 03/16/10  for a BMET, Mg, Pt/Inr ( anticipation of Tikosyn loading)  427.31

## 2010-08-28 NOTE — Progress Notes (Signed)
Summary: cardioversion  Phone Note Call from Patient Call back at Home Phone [phone]   Caller: Patient Reason for Call: Talk to Nurse Summary of Call: states she was to be sch for a cardioversion this week, req call back Initial call taken by: Migdalia Dk,  Nov 27, 2009 3:22 PM  Follow-up for Phone Call        pt is to be scheduled for a DCCV w TEE.  Will ask Dr Graciela Husbands if it's okay not to do TEE if INR above 2.0 tomorrow. Dennis Bast, RN, BSN  Nov 27, 2009 3:42 PM if pt's INR is > 2.0 today will be ok for DCCV for next week without TEE per Dr Benjiman Core, RN, BSN  Nov 28, 2009 10:06   Additional Follow-up for Phone Call Additional follow up Details #1::        Pt's INR 2.7 today per [facility].  Cardioversion without TEE scheduled for 12/07/09 at 2 PM.  Pt is to come in on 12/06/09 for labs.  Pt aware of dates.  Will send her instruction sheet for cardioversion with finalized information (time of cardioversion, when she is to arrive, etc.). Additional Follow-up by: Minerva Areola, RN, BSN,  Nov 28, 2009 2:38 PM

## 2010-08-28 NOTE — Assessment & Plan Note (Signed)
Summary: per night message pt fell has brusing and needs to be checked/lg  Nurse Visit   Vital Signs:  Patient profile:   77 year old female Weight:      132.50 pounds Pulse rate:   94 / minute  Vitals Entered By: Ollen Gross, RN, BSN (Dec 06, 2009 12:26 PM)  Impression & Recommendations:  Problem # 1:  ATRIAL FIBRILLATION (ICD-427.31)  Her updated medication list for this problem includes:    Metoprolol Succinate 100 Mg Xr24h-tab (Metoprolol succinate) .[name]... Take one tablet by mouth daily    Aspirin 81 Mg Tbec (Aspirin) .[name]... Take one tablet by mouth daily    Warfarin Sodium 2.5 Mg Tabs (Warfarin sodium) ..... Use as directed by anticoagualtion clinic  Orders: TLB-PT (Protime) (85610-PTP) Pt. in for nurse visit to assess pt's bruising and abrasion. Pt. fell two days ago. Pt. has an abrasion about an inch in lenth and width on right lower front  part of right leg which is  oozing slightly, light pink drainage. Pt. also has a large bruise and  moderate swelling on same leg right side close to knee, and  on upper thigh near the hip area is another large bruise. Also on right breast is a small bruise.  Amber Dr. Marikay Alar nurse advice to get today only PT/INR blood work . She will get in touch with Dr. Graciela Husbands about what to do next. Amber will call pt. later. Pt. aware. Pt. advice to go to her PCP to have the RLE abrasion check. Pt. verbalized understanding. See phone note. Gypsy Balsam RN BSN  Dec 07, 2009 8:03 AM    Allergies: 1)  ! Sulfa  Orders Added: 1)  TLB-PT (Protime) [85610-PTP]

## 2010-08-28 NOTE — Medication Information (Signed)
Summary: rov/cb **Pending DCCV**  Anticoagulant Therapy  Managed by: Weston Brass, PharmD Referring MD: Clifton James MD, Sherrilyn Rist MD: Excell Seltzer MD, Casimiro Needle Indication 1: Atrial Fibrillation Lab Used: [facility] [place] Site: [address] INR POC 1.5 INR RANGE 2.0-3.0  Dietary changes: yes       Details: ate an extra serving of green vegetables   Health status changes: no    Bleeding/hemorrhagic complications: no    Recent/future hospitalizations: no    Any changes in medication regimen? no    Recent/future dental: no  Any missed doses?: no       Is patient compliant with meds? yes       Allergies: 1)  ! Sulfa  Anticoagulation Management History:      The patient is taking warfarin and comes in today for a routine follow up visit.  Positive risk factors for bleeding include an age of 75 years or older.  The bleeding index is 'intermediate risk'.  Positive CHADS2 values include History of HTN and Age > 50 years old.  Anticoagulation responsible provider: Excell Seltzer MD, Casimiro Needle.  INR POC: 1.5.  Cuvette Lot#: [id number].  Exp: 11/2010.    Anticoagulation Management Assessment/Plan:      The patient's current anticoagulation dose is Warfarin sodium 2.5 mg tabs: Use as directed by Anticoagualtion Clinic.  The target INR is 2.0-3.0.  The next INR is due 11/14/2009.  Anticoagulation instructions were given to patient.  Results were reviewed/authorized by Weston Brass, PharmD.  She was notified by Weston Brass PharmD.         Prior Anticoagulation Instructions: INR 1.3 Today 1 pill then change dose to 1 pill everyday except 1/2 pill on Mondays and Fridays. Recheck in 10 days.   Current Anticoagulation Instructions: INR 1.5  Take 1 1/2 tablets today then increase dose to 1 tablet every day

## 2010-08-28 NOTE — Letter (Signed)
Summary: Cardioversion/TEE Instructions  Architectural technologist, Main Office  [address]   [place], [place] [address]   Phone: [phone]  Fax: [phone]    Cardioversion / TEE Cardioversion Instructions  You are scheduled for a Cardioversion on Thursday, Dec 07, 2009 at 2:00 PM with Dr. Graciela Husbands.   Please arrive at the [facility] at 12:00 NOON on the day of your procedure.  1)   DIET:    You may have a clear liquid breakfast, then nothing to eat or drink after 8:00 a.m.      Clear liquids include:  water, broth, Sprite, Ginger Ale, black coffee, tea (no sugar),      cranberry / grape / apple juice, jello (not red), popsicle from clear juices (not red).  2)   Come to the [place] office on Wednesday, Dec 06, 2009 in the morning for lab work. The lab at [facility] is open from 8:30 a.m. to 1:30 p.m. and 2:30 p.m. to 5:00 p.m. The lab at [facility] is open from 7:30 a.m. to 5:30 p.m. You do not have to be fasting.  3)   MAKE SURE YOU TAKE YOUR COUMADIN the day of the procedure.   4)  YOU MAY TAKE ALL of your medications with a small amount of water.   5)  Must have a responsible person to drive you home.  6)   Bring a current list of your medications and current insurance cards.   * Special Note:  Every effort is made to have your procedure done on time. Occasionally there are emergencies that present themselves at the hospital that may cause delays. Please be patient if a delay does occur.  * If you have any questions after you get home, please call the office at [phone].

## 2010-08-28 NOTE — Progress Notes (Signed)
Summary: Cardiology Phone Note - Abrasion and Bruising  Phone Note Call from Patient Call back at Home Phone [phone]   Caller: Patient Summary of Call: Received call from [name] stating that she fell and scraped her leg yesterday and has had some oozing from an abrasion that she obtained along with brusing.  She is concerned about her injuries but does not feel that they are severe.  She is scheduled for a DCCV later this week and blood work in our office tomorrow to re-eval her INR in hopes of avoiding need for a TEE.  I advised that she apply a guaze dressing with tape over the abrasion, applying a small amt. of pressure w/ the tape.  When she presents for labs tomorrow am, we will arrange for a nurse visit to assess her brusing and abrasion and determine whether or not we may need to hold coumadin and potentially scrap plans for DCCV considering that she will need uninterrupted coumadin following DCCV.  Pt. was grateful for the call-back. Initial call taken by: Creig Hines, ANP-BC,  Dec 05, 2009 9:40 PM

## 2010-08-28 NOTE — Progress Notes (Signed)
Summary: PT HAVING CHEST TIGHTNESS  Phone Note Call from Patient Call back at Home Phone [phone]   Caller: Patient Summary of Call: PT WAS GIVEN MEDS CALLED DILTIAZEM 180MG  QD TO SLOW HEART RATE DOWN AND SHE FEELS IT MAY BE TO SLOW AND SHE CHECKED IT AND THE RATE RANGES FROM 60-100 AND SHE FEELS SO TIRED AND SHE IS HAVING SOME CHEST TIGHTNESS AND SHE WAS JUST WONDERING WHAT TO DO Initial call taken by: Omer Jack,  August 29, 2009 1:36 PM  Follow-up for Phone Call        08/29/09--1pm--pt calling stating diltiazem, which she was put on in hospital is causing tightness in chest, and feeling extremely fatigued--spoke with dr Clide Cliff who states pt should d/c ditiazem and increase metoprlol to 50mg  two times a day--pt advised and agrees to do this--also f/u appoint made with dr Clifton James on 09/04/09 --already has appoint  for post hosp appoint with dr Clide Cliff  09/18/09 Follow-up by: Ledon Snare, RN,  August 29, 2009 3:15 PM    New/Updated Medications: METOPROLOL TARTRATE 50 MG TABS (METOPROLOL TARTRATE) Take one tablet by mouth twice a day Prescriptions: METOPROLOL TARTRATE 50 MG TABS (METOPROLOL TARTRATE) Take one tablet by mouth twice a day  #60 x 2   Entered by:   Ledon Snare, RN   Authorized by:   Verne Carrow, MD   Signed by:   Ledon Snare, RN on 08/29/2009   Method used:   Electron[facility] to        [facility]  [phone]* (retail)       [address] [place], [place]  [address]       Ph: [phone] or [phone]       Fax: [phone]   RxID:   [id number]

## 2010-08-28 NOTE — Medication Information (Signed)
Summary: rov/sp  Anticoagulant Therapy  Managed by: Bethena Midget, RN, BSN Referring MD: Clifton James MD, Sherrilyn Rist MD: Excell Seltzer MD, Casimiro Needle Indication 1: Atrial Fibrillation Lab Used: [facility]  Site: [address] INR POC 2.2 INR RANGE 2.0-3.0  Dietary changes: no    Health status changes: no    Bleeding/hemorrhagic complications: no    Recent/future hospitalizations: no    Any changes in medication regimen? yes       Details: Added Micardis HCT  Recent/future dental: no  Any missed doses?: no       Is patient compliant with meds? yes      Comments: Pending DCCV B/P 160/82  Current Medications (verified): 1)  Metoprolol Succinate 100 Mg Xr24h-Tab (Metoprolol Succinate) .... Take One Tablet By Mouth Daily 2)  Zocor 20 Mg Tabs (Simvastatin) .[name].. 1 Tab At Bedtime 3)  Levoxyl 50 Mcg Tabs (Levothyroxine Sodium) .[name].. 1 Tab Once Daily 4)  Allegra 180 Mg Tabs (Fexofenadine Hcl) .[name].. 1 Tab Once Daily 5)  Aspirin 81 Mg Tbec (Aspirin) .... Take One Tablet By Mouth Daily 6)  Vitamin D 2000 Unit Tabs (Cholecalciferol) .[name].. 1 Tab Once Daily 7)  Protonix 40 Mg Tbec (Pantoprazole Sodium) .[name].. 1 Tab Once Daily 8)  Fish Oil 1000 Mg Caps (Omega-3 Fatty Acids) .... Once Daily 9)  Multivitamins  Tabs (Multiple Vitamin) .... Once Daily 10)  Icaps  Caps (Multiple Vitamins-Minerals) .... Take 1 Daily 11)  Warfarin Sodium 2.5 Mg Tabs (Warfarin Sodium) .... Use As Directed By Anticoagualtion Clinic 12)  Mucinex Dm 30-600 Mg Xr12h-Tab (Dextromethorphan-Guaifenesin) .... Prn 13)  Micardis Hct 80-12.5 Mg Tabs (Telmisartan-Hctz) .... One Tablet Daily 14)  Alprazolam 0.25 Mg Tabs (Alprazolam) .... Take 1 Tablet By Mouth At Bedtime As Needed For Sleep  Allergies: 1)  ! Sulfa  Anticoagulation Management History:      The patient is taking warfarin and comes in today for a routine follow up visit.  Positive risk factors for bleeding include an age of 12 years or  older.  The bleeding index is 'intermediate risk'.  Positive CHADS2 values include History of HTN and Age > 75 years old.  Anticoagulation responsible provider: Excell Seltzer MD, Casimiro Needle.  INR POC: 2.2.  Cuvette Lot#: [id number].  Exp: 11/2010.    Anticoagulation Management Assessment/Plan:      The patient's current anticoagulation dose is Warfarin sodium 2.5 mg tabs: Use as directed by Anticoagualtion Clinic.  The target INR is 2.0-3.0.  The next INR is due 11/21/2009.  Anticoagulation instructions were given to patient.  Results were reviewed/authorized by Bethena Midget, RN, BSN.  She was notified by Bethena Midget, RN, BSN.         Prior Anticoagulation Instructions: INR 1.5  Take 1 1/2 tablets today then increase dose to 1 tablet every day   Current Anticoagulation Instructions: Pending DCCV INR 2.2 Continue 2.5mg s daily. Recheck in one week.

## 2010-08-28 NOTE — Assessment & Plan Note (Signed)
Summary: ekg  Nurse Visit   Vital Signs:  Patient profile:   77 year old female Weight:      134 pounds Pulse rate:   77 / minute Pulse rhythm:   irregular BP sitting:   122 / 80  (left arm)  Vitals Entered By: Meredith Staggers, RN (October 16, 2009 3:20 PM) Comments pt in for coumadin check today and c/o chest tightness, pt states it has been going on for 3-4 days, she states it is cont. and she has gotten some relief w/tylenol.  No SOB, no n/v, no radiating pain.  EKG obtained  showed a-fib at a rate of 77, med list reviewed w/pt.  Reviewed EKG w/Dr Graciela Husbands.  Pt will keep sch f/u appt on 4/5 w/Dr Levi Aland, RN  October 16, 2009 3:32 PM    Current Medications (verified): 1)  Metoprolol Succinate 50 Mg Xr24h-Tab (Metoprolol Succinate) .... Take 1.5 Tablet By Mouth Two Times A Day 2)  Zocor 20 Mg Tabs (Simvastatin) .[name].. 1 Tab At Bedtime 3)  Levoxyl 50 Mcg Tabs (Levothyroxine Sodium) .[name].. 1 Tab Once Daily 4)  Allegra 180 Mg Tabs (Fexofenadine Hcl) .[name].. 1 Tab Once Daily 5)  Aspirin 81 Mg Tbec (Aspirin) .... Take One Tablet By Mouth Daily 6)  Vitamin D 2000 Unit Tabs (Cholecalciferol) .[name].. 1 Tab Once Daily 7)  Protonix 40 Mg Tbec (Pantoprazole Sodium) .[name].. 1 Tab Once Daily 8)  Fish Oil 1000 Mg Caps (Omega-3 Fatty Acids) .... Once Daily 9)  Multivitamins  Tabs (Multiple Vitamin) .... Once Daily 10)  Icaps  Caps (Multiple Vitamins-Minerals) .... Take 1 Daily 11)  Warfarin Sodium 2.5 Mg Tabs (Warfarin Sodium) .... Use As Directed By Anticoagualtion Clinic 12)  Twynsta 80-5 Mg Tabs (Telmisartan-Amlodipine) .... Take 1 Tablet Daily 13)  Mucinex Dm 30-600 Mg Xr12h-Tab (Dextromethorphan-Guaifenesin) .... Prn  Allergies (verified): 1)  ! Sulfa

## 2010-08-28 NOTE — Medication Information (Signed)
Summary: rov/mw  Anticoagulant Therapy  Managed by: Weston Brass, PharmD Referring MD: Clifton James MD, Cristal Deer PCP: Hunt Oris MD: Eden Emms MD, Theron Arista Indication 1: Atrial Fibrillation Lab Used: [facility] [place] Site: [address] INR POC 1.5 INR RANGE 2.0-3.0  Dietary changes: no    Health status changes: no    Bleeding/hemorrhagic complications: no    Recent/future hospitalizations: no    Any changes in medication regimen? no    Recent/future dental: no  Any missed doses?: no       Is patient compliant with meds? yes       Allergies: 1)  ! Sulfa 2)  ! Pradaxa (Dabigatran Etexilate Mesylate)  Anticoagulation Management History:      The patient is taking warfarin and comes in today for a routine follow up visit.  Positive risk factors for bleeding include an age of 75 years or older.  The bleeding index is 'intermediate risk'.  Positive CHADS2 values include History of HTN and Age > 75 years old.  Her last INR was 2.7 ratio.  Anticoagulation responsible provider: Eden Emms MD, Theron Arista.  INR POC: 1.5.  Cuvette Lot#: [id number].  Exp: 06/2011.    Anticoagulation Management Assessment/Plan:      The patient's current anticoagulation dose is Warfarin sodium 2.5 mg tabs: Use as directed by Anticoagualtion Clinic.  The target INR is 2.0-3.0.  The next INR is due 07/10/2010.  Anticoagulation instructions were given to patient.  Results were reviewed/authorized by Weston Brass, PharmD.  She was notified by Weston Brass PharmD.         Prior Anticoagulation Instructions: INR 2.1 Continue taking a half tablet on monday. And take 1 tablet all other days. Recheck in 4 weeks.   Current Anticoagulation Instructions: INR 1.5  Take 1 1/2 tablets today and tomorrow then resume same dose of 1 tablet every day except 1/2 tablet on Monday.  Recheck INR in 2 weeks.

## 2010-08-28 NOTE — Progress Notes (Signed)
Summary: elevated BP/irregular hr  Phone Note Call from Patient Call back at Home Phone [phone]   Caller: Patient Summary of Call: bp is elevated and having irregular hr, did PCP Dr Ricki Miller Initial call taken by: Migdalia Dk,  November 10, 2009 1:33 PM  Follow-up for Phone Call        Dr Ricki Miller nurse called to talk to nurse about pt BP issues [name]  November 10, 2009 1:37 PM talked with patient --yesterday and today B/P has been elevated --today B/P 160/136 about one hour ago-yesterday B/P 172/129 and 150/106 and heart rate 88  /154/106 heart rate 76--meds reviewed with patient- states she discussed with Dr Pang(her PCP)  this morning and he suggested she call Dr Jamse Belfast review with Dr Tracie Harrier, RN, BSN  November 10, 2009 2:23 PM   Additional Follow-up for Phone Call Additional follow up Details #1::        reveiwed with Dr Rulon Abide Micardis 80/12.5mg  for a total of Micardis  160/12.5mg  daily, discussed with pt--she agreed to this plan-she will continue to take and record her B/P     New/Updated Medications: MICARDIS HCT 80-12.5 MG TABS (TELMISARTAN-HCTZ) one tablet daily Prescriptions: MICARDIS HCT 80-12.5 MG TABS (TELMISARTAN-HCTZ) one tablet daily  #90 x 3   Entered by:   Katina Dung, RN, BSN   Authorized by:   Nathen May, MD, [facility]   Signed by:   Katina Dung, RN, BSN on 11/10/2009   Method used:   [facility] to        [facility]  [phone]* (retail)       [address] [place], [place]  [address]       Ph: [phone] or [phone]       Fax: [phone]   RxID:   [id number]  pt to return for BMP in 2 weeks per Dr Rhodia Albright discussed with pt and she will return 11-21-09 for BMP

## 2010-08-28 NOTE — Progress Notes (Signed)
  Walk in Patient Form Recieved " Pt needs to Speak w/ Nurse reguarding procedure" sent to [facility]  March 19, 2010 8:12 AM

## 2010-08-28 NOTE — Progress Notes (Signed)
Summary: not feeling well - pradaxa  Phone Note Call from Patient Call back at Home Phone [phone]   Caller: Patient Reason for Call: Talk to Nurse Summary of Call: pt on PRADAXA 150 MG CAPS Take 1 capsule by mouth two times a day c/o not feeling well , wants to go back on couamdin.  Initial call taken by: Lorne Skeens,  February 14, 2010 1:08 PM  Follow-up for Phone Call        pts stomach not feeling well and she has trouble swallowing the pill, wants to go back to coumadin will forward to couamdin clinic they will call her to restart couamdin and sch appt Meredith Staggers, RN  February 14, 2010 1:27 PM   Additional Follow-up for Phone Call Additional follow up Details #1::        Spoke with pt and she states she wants to restart coumadin and discontinue Pradaxa. She was instructed to overlap coumadin and Pradaxa for 3days then d/c pradaxa and continue with couamdin. She will RTC in one week.  Additional Follow-up by: Bethena Midget, RN, BSN,  February 14, 2010 2:26 PM

## 2010-08-28 NOTE — Assessment & Plan Note (Signed)
Summary: f/u Tikosyn CP   Visit Type:  Follow-up Primary Provider:  Ricki Miller  CC:  no complaints.  History of Present Illness: [name] is seen in followup for atrial fibrillation for which she is currently taking Tikosyn.  She says she is finally feeling better. She's had very infrequent palpitations. Her energy level is improved. She does have mild headaches temporally related to her Tikosyn and likely consequences of that   her blood pressure is much improved  Problems Prior to Update: 1)  Sick Sinus/ Tachy-brady Syndrome  (ICD-427.81) 2)  Hypertension, Benign  (ICD-401.1) 3)  Atrial Fibrillation  (ICD-427.31) 4)  Svt/ Psvt/ Pat  (ICD-427.0) 5)  Chest Pain Unspecified  (ICD-786.50)  Current Medications (verified): 1)  Metoprolol Succinate 50 Mg Xr24h-Tab (Metoprolol Succinate) .... Take One Tablet By Mouth Daily 2)  Zocor 20 Mg Tabs (Simvastatin) .[name].. 1 Tab At Bedtime 3)  Levoxyl 50 Mcg Tabs (Levothyroxine Sodium) .[name].. 1 Tab Once Daily 4)  Allegra 180 Mg Tabs (Fexofenadine Hcl) .[name].. 1 Tab Once Daily 5)  Vitamin D 2000 Unit Tabs (Cholecalciferol) .[name].. 1 Tab Once Daily 6)  Fish Oil 1000 Mg Caps (Omega-3 Fatty Acids) .... Once Daily 7)  Multivitamins  Tabs (Multiple Vitamin) .... Once Daily 8)  Icaps  Caps (Multiple Vitamins-Minerals) .... Take 1 Daily 9)  Mucinex Dm 30-600 Mg Xr12h-Tab (Dextromethorphan-Guaifenesin) .... Prn 10)  Alprazolam 0.25 Mg Tabs (Alprazolam) .... Take 1 Tablet By Mouth At Bedtime As Needed For Sleep 11)  Cardizem 120 Mg Tabs (Diltiazem Hcl) .... Take 1 Capsule By Mouth Once A Day 12)  Micardis 80 Mg Tabs (Telmisartan) .... Take One Tablet in The Evening 13)  Warfarin Sodium 2.5 Mg Tabs (Warfarin Sodium) .... Use As Directed By Anticoagualtion Clinic 14)  Tikosyn 125 Mcg Caps (Dofetilide) .[name].. 1 Two Times A Day 15)  Spironolactone 25 Mg Tabs (Spironolactone) .... Take One Half  Tablet By Mouth Daily  Allergies (verified): 1)  ! Sulfa 2)  ! Pradaxa  (Dabigatran Etexilate Mesylate)  Past History:  Past Medical History: Last updated: 03/15/2010 atrial fibrillation failure to tolerate Pradaxa secondary to GI symptoms 1st degree AV block aggravated by flecainide 1.  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     The patient reports having 1 Holter monitor in the past as        recently as this past summer with [facility].   2. History of chest pain in the setting of palpitations.       a.     The patient reports normal catheterization about 10 years        ago, performed by Dr. Aleen Campi.       b.     Normal stress test in 2010, performed by Dr. Reyes Ivan.   3. Hypertension.   4. Hypothyroidism.   5. Hyperlipidemia.   6. Gastroesophageal reflux disease.   7. Seasonal allergies.   Vital Signs:  Patient profile:   77 year old female Height:      625 inches Weight:      133 pounds BMI:     0.24 Pulse rate:   65 / minute BP sitting:   130 / 68  (right arm) Cuff size:   regular  Vitals Entered By: Caralee Ates CMA (April 30, 2010 9:34 AM)  Physical Exam  General:  The patient was alert and oriented in no acute distress. HEENT Normal.  Neck veins were flat, carotids were brisk.  Lungs were clear.  Heart sounds were regular without murmurs or gallops.  Abdomen was soft with active bowel sounds. There is no clubbing cyanosis or edema. Skin Warm and dry    Impression & Recommendations:  Problem # 1:  HYPERTENSION, BENIGN (ICD-401.1) improved control with the addition of spironlocatone will check K today Her updated medication list for this problem includes:    Metoprolol Succinate 50 Mg Xr24h-tab (Metoprolol succinate) .[name]... Take one tablet by mouth daily    Cardizem 120 Mg Tabs (Diltiazem hcl) .[name]... Take 1 capsule by mouth once a day    Micardis 80 Mg Tabs (Telmisartan) .[name]... Take one tablet in the evening    Spironolactone 25 Mg Tabs (Spironolactone) .[name]... Take one half   tablet by mouth daily  Problem # 2:  ATRIAL FIBRILLATION (ICD-427.31) HOlding sinus  WIll check MAg, K .  ECG shows QT interval WNL  Orders: TLB-BMP (Basic Metabolic Panel-BMET) (80048-METABOL) TLB-Magnesium (Mg) (83735-MG)  Problem # 3:  SICK SINUS/ TACHY-BRADY SYNDROME (ICD-427.81) HAd bradycardia which prompted downtritarion of her beta blocker which she has tolerated and HR is normal. Her updated medication list for this problem includes:    Metoprolol Succinate 50 Mg Xr24h-tab (Metoprolol succinate) .[name]... Take one tablet by mouth daily    Cardizem 120 Mg Tabs (Diltiazem hcl) .[name]... Take 1 capsule by mouth once a day    Warfarin Sodium 2.5 Mg Tabs (Warfarin sodium) ..... Use as directed by anticoagualtion clinic    Tikosyn 125 Mcg Caps (Dofetilide) .[name]... 1 two times a day  Orders: TLB-BMP (Basic Metabolic Panel-BMET) (80048-METABOL) TLB-Magnesium (Mg) (83735-MG)  Other Orders: EKG w/ Interpretation (93000)  Patient Instructions: 1)  Your physician recommends that you schedule a follow-up appointment in: 4 months 2)  Your physician recommends that you have BMET and Mag check today. 3)  Your physician recommends that you continue on your current medications as directed. Please refer to the Current Medication list given to you today.

## 2010-08-28 NOTE — Progress Notes (Signed)
Summary: Cardiology Phone Note - a. fib/ chest pain  Phone Note Call from Patient   Caller: Patient Summary of Call: pt called this evening stating that she's been back in a.fib since friday.  she has not checked her hr but notes that she's been having chest tightness this pm.  she's asking if she can wait till morning to be seen in the office.   i advised that if she's feeling poorly w c/p then she should be seen tonight for evaluation and ecg.  she's not sure if she wants to come to ED or not.  she will think about it. Initial call taken by: Creig Hines, ANP-BC,  June 10, 2010 9:41 PM

## 2010-08-28 NOTE — Assessment & Plan Note (Signed)
Summary: follow up af /flecainide stopped on 6/2/jr   Primary Provider:  Ricki Miller  CC:  follow up.  [name]  History of Present Illness:  [name] is  in followup for atrial fibrillation with a variable ventricular response most recently quite rapid.  She has normal left ventricular function and a nonischemic Myoview in 2010. We undertook cardioversion without antiarrhythmic support about 3  months ago She maintained sinus rhythm for just a few days and then reverted to a to fibrillation with a rapid ventricular response prompting repeat hospitalization. We then discussed rate versus rhythm control. Her heart rates were much better controlled on diltiazem and she felt better and she decided to go home. She subsequently found that atrial fibrillation with controlled ventricular response was still quite symptomatic and we began her on flecainide. Unfortunately this is associated with significant PR prolongation prompting its discontinuation about a month ago. She is here today in followup.  she feels much better in sinus rhythm.  She does note a left-sided chest pain occurred right below her bra strap. It is not aggravated by breathing.  Current Medications (verified): 1)  Metoprolol Succinate 100 Mg Xr24h-Tab (Metoprolol Succinate) .... Take 1 Tablet By Mouth Once A Day 2)  Zocor 20 Mg Tabs (Simvastatin) .[name].. 1 Tab At Bedtime 3)  Levoxyl 50 Mcg Tabs (Levothyroxine Sodium) .[name].. 1 Tab Once Daily 4)  Allegra 180 Mg Tabs (Fexofenadine Hcl) .[name].. 1 Tab Once Daily 5)  Vitamin D 2000 Unit Tabs (Cholecalciferol) .[name].. 1 Tab Once Daily 6)  Protonix 40 Mg Tbec (Pantoprazole Sodium) .[name].. 1 Tab Once Daily 7)  Fish Oil 1000 Mg Caps (Omega-3 Fatty Acids) .... Once Daily 8)  Multivitamins  Tabs (Multiple Vitamin) .... Once Daily 9)  Icaps  Caps (Multiple Vitamins-Minerals) .... Take 1 Daily 10)  Warfarin Sodium 2.5 Mg Tabs (Warfarin Sodium) .... Use As Directed By Anticoagualtion Clinic 11)  Mucinex Dm 30-600 Mg  Xr12h-Tab (Dextromethorphan-Guaifenesin) .... Prn 12)  Micardis Hct 80-12.5 Mg Tabs (Telmisartan-Hctz) .... Take One Tablet Once Daily 13)  Alprazolam 0.25 Mg Tabs (Alprazolam) .... Take 1 Tablet By Mouth At Bedtime As Needed For Sleep 14)  Cardizem 120 Mg Tabs (Diltiazem Hcl) .[name].. 1 Podaily 15)  Micardis 80 Mg Tabs (Telmisartan) .... Take One Tablet in The Evening  Allergies (verified): 1)  ! Sulfa  Past History:  Past Medical History: Last updated: 10/31/2009 atrial fibrillation 1.  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     The patient reports having 1 Holter monitor in the past as        recently as this past summer with [facility].   2. History of chest pain in the setting of palpitations.       a.     The patient reports normal catheterization about 10 years        ago, performed by Dr. Aleen Campi.       b.     Normal stress test in 2010, performed by Dr. Reyes Ivan.   3. Hypertension.   4. Hypothyroidism.   5. Hyperlipidemia.   6. Gastroesophageal reflux disease.   7. Seasonal allergies.   Past Surgical History: Last updated: Sep 12, 2009  1. Status post hysterectomy.   2. Status post laparoscopic bilateral salpingo-oophorectomy.   3. Knee surgery-right, arthroscopic  Family History: Last updated: 2009/09/12 Mother-101 when she died Father-died age 60 from cardiac issues 2 children both alive and healthy  Social History: Last updated: 09/12/2009 Married  and lives here in [place] with her husband.  She has two children, two grandchilren and three great grandchildren. She does not smoke, use drugs or alcohol.  Does not work outside the home Enjoys reading and going to the beach  Vital Signs:  Patient profile:   77 year old female Height:      62.5 inches Weight:      130 pounds BMI:     23.48 Pulse rate:   63 / minute Pulse rhythm:   regular BP sitting:   154 / 80  (left arm) Cuff size:   regular  Vitals  Entered By: Judithe Modest CMA (January 30, 2010 2:15 PM)  Physical Exam  General:  The patient was alert and oriented in no acute distress. HEENT Normal.  Neck veins were flat, carotids were brisk.  Lungs were clear.  Heart sounds were regular without murmurs or gallops.  Abdomen was soft with active bowel sounds. There is no clubbing cyanosis or edema. Skin Warm and dry tender ness above the rib on the left side   Impression & Recommendations:  Problem # 1:  ATRIAL FIBRILLATION (ICD-427.31) the patient's atrial fibrillation is currently holding sinus rhythm. she is maintaining a now on diltiazem.  Also discussed the role of Pradaxa as an alternative to warfarin. We discussed its benefits as well as his potential but risks including GI symptoms. She would like to begin on this.  creatinine was 0.96 in May 2011     Warfarin Sodium 2.5 Mg Tabs (Warfarin sodium) ..... Use as directed by anticoagualtion clinic  Orders: EKG w/ Interpretation (93000)  Problem # 2:  CHEST PAIN UNSPECIFIED (ICD-786.50) Her chest pain appears to be musculoskeletal in origin. We will continue her on her current medications. She is followup scheduled with her primary care physician in a few weeks and will bring this issue to his attention Her updated medication list for this problem includes:    Metoprolol Succinate 100 Mg Xr24h-tab (Metoprolol succinate) .[name]... Take 1 tablet by mouth once a day    Cardizem 120 Mg Tabs (Diltiazem hcl) .[name]... Take 1 capsule by mouth once a day  Problem # 3:  HYPERTENSION, BENIGN (ICD-401.1) blood pressures are much improved on the new medications the blood pressures at home ranging 130-40 systolic range.  we will continue on her current medications.  Her updated medication list for this problem includes:    Metoprolol Succinate 100 Mg Xr24h-tab (Metoprolol succinate) .[name]... Take 1 tablet by mouth once a day    Micardis Hct 80-12.5 Mg Tabs (Telmisartan-hctz) .[name]... Take one  tablet once daily    Cardizem 120 Mg Tabs (Diltiazem hcl) .[name]... Take 1 capsule by mouth once a day    Micardis 80 Mg Tabs (Telmisartan) .[name]... Take one tablet in the evening  Patient Instructions: 1)  Your physician has recommended you make the following change in your medication: Stop Warfarin.  After being off Warfarin for 2 days then start Pradaxa 150mg  1 capsule twice daily. 2)  Your physician recommends that you schedule a follow-up appointment in:  3)  Your physician wants you to follow-up in: 6 months with Dr Graciela Husbands.  You will receive a reminder letter in the mail two months in advance. If you don't receive a letter, please call our office to schedule the follow-up appointment. Prescriptions: PRADAXA 150 MG CAPS (DABIGATRAN ETEXILATE MESYLATE) Take 1 capsule by mouth two times a day  #60 x 1   Entered by:   Optometrist  BSN   Authorized by:   Nathen May, MD, [facility]   Signed by:   Gypsy Balsam RN BSN on 01/30/2010   Method used:   Electron[facility] to        [facility]  [phone]* (retail)       [address] [place], [place]  [address]       Ph: [phone] or [phone]       Fax: [phone]   RxID:   [id number] CARDIZEM 120 MG TABS (DILTIAZEM HCL) Take 1 capsule by mouth once a day  #90 x 3   Entered by:   Optometrist BSN   Authorized by:   Nathen May, MD, [facility]   Signed by:   Gypsy Balsam RN BSN on 01/30/2010   Method used:   Electronically to        [facility]  [phone]* (retail)       [address] [place], [place]  [address]       Ph: [phone] or [phone]       Fax: [phone]   RxID:   [id number] MICARDIS HCT 80-12.5 MG TABS (TELMISARTAN-HCTZ) take one tablet once daily  #90 x 3   Entered by:   Optometrist BSN   Authorized by:   Nathen May, MD, [facility]   Signed by:   Gypsy Balsam RN BSN on 01/30/2010   Method used:   [facility] to        [facility]  [phone]* (retail)       [address] [place], [place]  [address]       Ph: [phone] or [phone]       Fax: [phone]   RxID:   [id number]   Appended Document: [facility]     Allergies: 1)  ! Sulfa  Past History:  Past Medical History: atrial fibrillation 1st degree AV block aggravated by flecainide 1.  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     The patient reports having 1 Holter monitor in the past as        recently as this past summer with [facility].   2. History of chest pain in the setting of palpitations.       a.     The patient reports normal catheterization about 10 years        ago, performed by Dr. Aleen Campi.       b.     Normal stress test in 2010, performed by Dr. Reyes Ivan.   3. Hypertension.   4. Hypothyroidism.   5. Hyperlipidemia.   6. Gastroesophageal reflux disease.   7. Seasonal allergies.    Impression & Recommendations:  Problem # 1:  AV BLOCK, 1ST DEGREE-IATROGENIC (ICD-426.11) AV block is much improved with the discontinuation of the ones she is currently drug. In the event that we may drug therapy for atrial fibrillation will end up getting pacing or to try a class III drug with sotalol or Tikosyn Her updated medication list for this problem includes:    Metoprolol Succinate 100 Mg Xr24h-tab (Metoprolol succinate) .[name]... Take 1 tablet by mouth once a day    Cardizem 120 Mg Tabs (Diltiazem hcl) .[name]... Take 1 capsule by mouth once a day

## 2010-08-28 NOTE — Progress Notes (Signed)
Summary: Pt returning call  Phone Note Call from Patient Call back at [facility] Phone [phone]   Caller: Patient Summary of Call: Pt returning call Initial call taken by: Judie Grieve,  Dec 22, 2009 12:53 PM  Follow-up for Phone Call        [name] was returning a call from ? [name].  Her husband took the message.  She will call back next week.  She wanted to know what day her cardioversion was scheduled for. [name] RN

## 2010-08-28 NOTE — Medication Information (Signed)
Summary: rov/sp  Anticoagulant Therapy  Managed by: Weston Brass, PharmD Referring MD: Clifton James MD, Sherrilyn Rist MD: Daleen Squibb MD, Maisie Fus Indication 1: Atrial Fibrillation Lab Used: [facility] [place] Site: [address] INR POC 4.0 INR RANGE 2.0-3.0  Dietary changes: no    Health status changes: yes       Details: had cardioversion next week but went back into afib.  Saw Dr. Graciela Husbands today   Recent/future hospitalizations: yes       Details: discharged 5/18 after epsiode of afib  Any changes in medication regimen? yes       Details: Starting flecainide today  Recent/future dental: no  Any missed doses?: yes     Details: missed 1 dose the day she went to the hospital  Is patient compliant with meds? yes       Allergies: 1)  ! Sulfa  Anticoagulation Management History:      The patient is taking warfarin and comes in today for a routine follow up visit.  Positive risk factors for bleeding include an age of 75 years or older.  The bleeding index is 'intermediate risk'.  Positive CHADS2 values include History of HTN and Age > 62 years old.  Her last INR was 5.1 ratio.  Anticoagulation responsible provider: Daleen Squibb MD, Maisie Fus.  INR POC: 4.0.  Cuvette Lot#: [id number].  Exp: 02/2011.    Anticoagulation Management Assessment/Plan:      The patient's current anticoagulation dose is Warfarin sodium 2.5 mg tabs: Use as directed by Anticoagualtion Clinic.  The target INR is 2.0-3.0.  The next INR is due 12/28/2009.  Anticoagulation instructions were given to patient.  Results were reviewed/authorized by Weston Brass, PharmD.  She was notified by Weston Brass PharmD.         Prior Anticoagulation Instructions: INR 2.8  Continue same dose of 1 tablet every day except 2 tablets on Tuesday   Current Anticoagulation Instructions: INR 4.0  Skip today's dose of Coumadin then resume same dose of 1 tablet every day except 2 tablets on Tuesday.

## 2010-08-28 NOTE — Progress Notes (Signed)
Summary:  Question about Metoprolol 50mg   Phone Note Call from Patient Call back at Home Phone [phone]   Caller: Patient Summary of Call: Pt calling about her Metoprolol 50mg  want to know how to take this medication. Initial call taken by: Judie Grieve,  August 31, 2009 9:08 AM  Follow-up for Phone Call        Called patient and left message on machine  Duncan Dull, RN, BSN  August 31, 2009 5:25 PM  pt returning call, Migdalia Dk  September 01, 2009 8:42 AM    Additional Follow-up for Phone Call Additional follow up Details #1::        Pt saw Dr. Sanjuana Kava yesterday and will f/u with Dr. Graciela Husbands on 2/21 Additional Follow-up by: Duncan Dull, RN, BSN,  September 05, 2009 9:47 AM

## 2010-08-28 NOTE — Letter (Signed)
[facility]   Imported By: Marylou Mccoy 04/17/2010 13:18:52  _____________________________________________________________________  External Attachment:    Type:   Image     Comment:   External Document

## 2010-08-28 NOTE — Medication Information (Signed)
Summary: rov/tm  Anticoagulant Therapy  Managed by: Weston Brass, PharmD Referring MD: Clifton James MD, Sherrilyn Rist MD: Graciela Husbands MD, Viviann Spare Indication 1: Atrial Fibrillation Lab Used: [facility] [place] Site: [address] INR POC 2.8 INR RANGE 2.0-3.0  Dietary changes: no    Health status changes: no    Bleeding/hemorrhagic complications: yes       Details: tripped over dishwasher and has some bruises on her leg   Recent/future hospitalizations: yes       Details: cardioverted last week  Any changes in medication regimen? no    Recent/future dental: no  Any missed doses?: no       Is patient compliant with meds? yes       Allergies: 1)  ! Sulfa  Anticoagulation Management History:      The patient is taking warfarin and comes in today for a routine follow up visit.  Positive risk factors for bleeding include an age of 75 years or older.  The bleeding index is 'intermediate risk'.  Positive CHADS2 values include History of HTN and Age > 75 years old.  Her last INR was 5.1 ratio.  Anticoagulation responsible provider: Graciela Husbands MD, Viviann Spare.  INR POC: 2.8.  Cuvette Lot#: [id number].  Exp: 02/2011.    Anticoagulation Management Assessment/Plan:      The patient's current anticoagulation dose is Warfarin sodium 2.5 mg tabs: Use as directed by Anticoagualtion Clinic.  The target INR is 2.0-3.0.  The next INR is due 12/21/2009.  Anticoagulation instructions were given to patient.  Results were reviewed/authorized by Weston Brass, PharmD.  She was notified by Weston Brass PharmD.         Prior Anticoagulation Instructions: INR 5.1  LMOM for pt.  Instructed her to hold her Coumadin today and tomorrow then resume normal dose of Coumadin which is 2.5mg  daily except 5mg  on Tuesday.  She is to report to the hospital as planned tomorrow for cardioversion.  Her next appt with Coumadin clinic is Tuesday 3/17  Current Anticoagulation Instructions: INR 2.8  Continue same dose of  1 tablet every day except 2 tablets on Tuesday

## 2010-08-28 NOTE — Medication Information (Signed)
Summary: rov/tm  Anticoagulant Therapy  Managed by: Bethena Midget, RN, BSN Referring MD: Clifton James MD, Sherrilyn Rist MD: Daleen Squibb MD, Maisie Fus Indication 1: Atrial Fibrillation Lab Used: [facility] [place] Site: [address] INR POC 2.7 INR RANGE 2.0-3.0  Dietary changes: no    Health status changes: no    Bleeding/hemorrhagic complications: no    Recent/future hospitalizations: no    Any changes in medication regimen? yes       Details: Metoprolol increased and Micardis increased with HCTZ  Recent/future dental: no  Any missed doses?: no       Is patient compliant with meds? yes      Comments: Pending DCCV  Allergies: 1)  ! Sulfa  Anticoagulation Management History:      The patient is taking warfarin and comes in today for a routine follow up visit.  Positive risk factors for bleeding include an age of 75 years or older.  The bleeding index is 'intermediate risk'.  Positive CHADS2 values include History of HTN and Age > 36 years old.  Anticoagulation responsible provider: Daleen Squibb MD, Maisie Fus.  INR POC: 2.7.  Cuvette Lot#: [id number].  Exp: 12/2010.    Anticoagulation Management Assessment/Plan:      The patient's current anticoagulation dose is Warfarin sodium 2.5 mg tabs: Use as directed by Anticoagualtion Clinic.  The target INR is 2.0-3.0.  The next INR is due 12/12/2009.  Anticoagulation instructions were given to patient.  Results were reviewed/authorized by Bethena Midget, RN, BSN.  She was notified by Bethena Midget, RN, BSN.         Prior Anticoagulation Instructions: INR 2.1 Today 2 pills then change dose to 1 pill everyday except 2 pills on Tuesdays. Recheck in one week.   Current Anticoagulation Instructions: INR 2.7 Pending DCCV next week, pre-op labs to be done.  Continue 1 pill everyday except 2 pills on Tuesdays. Recheck in two weeks.

## 2010-08-28 NOTE — Progress Notes (Signed)
Summary: talk to nurse  Phone Note Call from Patient Call back at Home Phone [phone]   Caller: Patient Summary of Call: pt states that she is in AFIB and that she feels her medications are not working. She wanted to know if she could be seen today. Pt called and spoke to Ward Givens last night and he told her she may need to come to the ER and be evaluated. There is a note in  her chart about this.  Initial call taken by: Edman Circle,  June 11, 2010 8:12 AM  Follow-up for Phone Call        spoke w/pt and she adv last night her chest pain was terrible but it passed. states it feels like she is in a-fib, not too shob and and has been sick on stomach off and on. denies emesis. pt b/p readings 149/92 and 168/80, pulse 76. pt may call primary doctor to see if can get in there since Dr. Graciela Husbands not in for EKG and to discuss possible bladder infection.  If she needs EKG here, will call back.  Follow-up by: Claris Gladden RN,  June 11, 2010 10:14 AM

## 2010-08-28 NOTE — Progress Notes (Signed)
Summary: dicuss procedure  Phone Note Call from Patient Call back at Home Phone [phone]   Caller: Patient Reason for Call: Talk to Nurse Complaint: Cough/Sore throat Summary of Call: wants to have additonal discussion about procedure.  Initial call taken by: Lorne Skeens,  March 19, 2010 2:37 PM  Follow-up for Phone Call        SPOKE WITH PT  PT. PT  AWARE TO BE ADMITTED TO [facility] ON 03/22/10 FOR TIKOSYN LOAD. NEED TO INFORM PT TO START MAG OX AT 400 MG once daily .UNABLE TO REACH PT AGAIN TO NOTIFY OF MED ADDITION. Follow-up by: Scherrie Bateman, LPN,  March 19, 2010 3:03 PM  Additional Follow-up for Phone Call Additional follow up Details #1::        PT AWARE  WILL TAKE MAG  500MG  MORE AVAILABLE OTC PER DR Graciela Husbands OKAY WITH THAT. Additional Follow-up by: Scherrie Bateman, LPN,  March 20, 2010 6:17 PM

## 2010-08-28 NOTE — Assessment & Plan Note (Signed)
Summary: per coumadin clinic/3/13 dmch/sdaf   CC:  pt following up on atrial fibrillation.  EKG 3/21 at hospital.  .  History of Present Illness: Mrs. Bettinger is seen in followup for atrial and supraventricular arrhythmias for which she was hospitalized first in January and then with atrial fibrillation as a new diagnosis in March.  This was associated with a rapid ventricular response Coumadin was initiated. Her heart rate slowed with beta blockers and she was discharged.  review of that hospital summary noted mild hypokalemia borderline low TSH.   she is previously normally identified left ventricular function.  since discharge she has struggled to have her INR therapeutic. She also has continued to have irregular palpitations although without racing.  There has been some chest tightness this has been a recurring complaint particularly with fast rates  Initial attempts using diltiazem earlier in the season failed because of side effects  Current Medications (verified): 1)  Metoprolol Succinate 50 Mg Xr24h-Tab (Metoprolol Succinate) .... Take 1.5 Tablet By Mouth Two Times A Day 2)  Zocor 20 Mg Tabs (Simvastatin) .[name].. 1 Tab At Bedtime 3)  Levoxyl 50 Mcg Tabs (Levothyroxine Sodium) .[name].. 1 Tab Once Daily 4)  Allegra 180 Mg Tabs (Fexofenadine Hcl) .[name].. 1 Tab Once Daily 5)  Aspirin 81 Mg Tbec (Aspirin) .... Take One Tablet By Mouth Daily 6)  Vitamin D 2000 Unit Tabs (Cholecalciferol) .[name].. 1 Tab Once Daily 7)  Protonix 40 Mg Tbec (Pantoprazole Sodium) .[name].. 1 Tab Once Daily 8)  Fish Oil 1000 Mg Caps (Omega-3 Fatty Acids) .... Once Daily 9)  Multivitamins  Tabs (Multiple Vitamin) .... Once Daily 10)  Icaps  Caps (Multiple Vitamins-Minerals) .... Take 1 Daily 11)  Warfarin Sodium 2.5 Mg Tabs (Warfarin Sodium) .... Use As Directed By Anticoagualtion Clinic 12)  Mucinex Dm 30-600 Mg Xr12h-Tab (Dextromethorphan-Guaifenesin) .... Prn 13)  Micardis 80 Mg Tabs (Telmisartan) .... Take 1 Tablet  Daily.  Allergies (verified): 1)  ! Sulfa  Past History:  Past Surgical History: Last updated: 2009-09-08  1. Status post hysterectomy.   2. Status post laparoscopic bilateral salpingo-oophorectomy.   3. Knee surgery-right, arthroscopic  Family History: Last updated: 09/08/2009 Mother-101 when she died Father-died age 11 from cardiac issues 2 children both alive and healthy  Social History: Last updated: 09/08/2009 Married and lives here in [place] with her husband.  She has two children, two grandchilren and three great grandchildren. She does not smoke, use drugs or alcohol.  Does not work outside the home Enjoys reading and going to [employer]  Past Medical History: atrial fibrillation 1.  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     The patient reports having 1 Holter monitor in the past as        recently as this past summer with [facility].   2. History of chest pain in the setting of palpitations.       a.     The patient reports normal catheterization about 10 years        ago, performed by Dr. Aleen Campi.       b.     Normal stress test in 2010, performed by Dr. Reyes Ivan.   3. Hypertension.   4. Hypothyroidism.   5. Hyperlipidemia.   6. Gastroesophageal reflux disease.   7. Seasonal allergies.   Vital Signs:  Patient profile:   77 year old female Height:      62.5 inches Weight:  134 pounds BMI:     24.21 Pulse rate:   97 / minute Pulse rhythm:   irregular BP sitting:   141 / 85  (left arm) Cuff size:   regular  Vitals Entered By: Judithe Modest CMA (October 31, 2009 4:18 PM)  Physical Exam  General:  The patient was alert and oriented in no acute distress.Neck veins were flat, carotids were brisk. Lungs were clear. Heart sounds were irregular without murmurs or gallops. Abdomen was soft with active bowel sounds. There is no clubbing cyanosis or edema.    Impression & Recommendations:  Problem # 1:   ATRIAL FIBRILLATION (ICD-427.31) she remained in persistent atrial fibrillation. We'll wait a therapeutic INR and 3 weeks of antecedent anticoagulation so that we can proceed with cardioversion. We reviewed this strategy today in detail. We also discussed the role of Pradaxa as an alternative anticoagulation agent. Particularly important to the patient was his lack of reversibility. I did stress that the efficacy of the drug and the intracranial hemorrhage at rates of the drug or superior to Coumadin. We also discussed GI side effects. For now she would like to continue with warfarin.  Her rate is somewhat rapid at 96 at rest. While with the rates to trial suggests that this is adequate, with her ongoing symptoms, as well as her elevated blood pressure we'll increase her metoprolol from 75-100 mg a day. She has tolerated thus far without significant complications. She is concerned that her chest pressure is secondary to the drug. It may also be related to more rapid rates as it is currently not an issue  Problem # 2:  HYPERTENSION, BENIGN (ICD-401.1) as above Her updated medication list for this problem includes:    Metoprolol Succinate 100 Mg Xr24h-tab (Metoprolol succinate) .[name]... Take one tablet by mouth daily    Aspirin 81 Mg Tbec (Aspirin) .[name]... Take one tablet by mouth daily    Micardis 80 Mg Tabs (Telmisartan) .[name]... Take 1 tablet daily.  Problem # 3:  SVT/ PSVT/ PAT (ICD-427.0) her reentrant arrhythmias are currently quiet Her updated medication list for this problem includes:    Metoprolol Succinate 100 Mg Xr24h-tab (Metoprolol succinate) .[name]... Take one tablet by mouth daily    Aspirin 81 Mg Tbec (Aspirin) .[name]... Take one tablet by mouth daily    Warfarin Sodium 2.5 Mg Tabs (Warfarin sodium) ..... Use as directed by anticoagualtion clinic  Orders: EKG w/ Interpretation (93000)  Patient Instructions: 1)  Your physician has recommended you make the following change in your medication:  Change Metoprolol to 100mg  1 tablet daily. 2)  When Coumadin level is therapeutic for 3 weeks, we will plan cardioversion.  Prescriptions: METOPROLOL SUCCINATE 100 MG XR24H-TAB (METOPROLOL SUCCINATE) Take one tablet by mouth daily  #30 x 11   Entered by:   Optometrist BSN   Authorized by:   Nathen May, MD, [facility]   Signed by:   Gypsy Balsam RN BSN on 10/31/2009   Method used:   Electronically to        [facility]  [phone]* (retail)       [address] [place], [place]  [address]       Ph: [phone] or [phone]       Fax: [phone]   RxID:   [id number]

## 2010-08-28 NOTE — Medication Information (Signed)
Summary: rov/tm  Anticoagulant Therapy  Managed by: Weston Brass, PharmD Referring MD: Clifton James MD, Cristal Deer PCP: Hunt Oris MD: Daleen Squibb MD, Maisie Fus Indication 1: Atrial Fibrillation Lab Used: [facility] [place] Site: [address] INR POC 1.7 INR RANGE 2.0-3.0  Dietary changes: no    Health status changes: no    Bleeding/hemorrhagic complications: no    Recent/future hospitalizations: no    Any changes in medication regimen? yes       Details: Pt switched from Pradaxa to Coumadin last week due to stomach upset. Started Coumadin at normal dose on 7/20 and overlapped with Pradaxa x 3 days.   Recent/future dental: no  Any missed doses?: no       Is patient compliant with meds? yes       Allergies: 1)  ! Sulfa  Anticoagulation Management History:      The patient is taking warfarin and comes in today for a routine follow up visit.  Positive risk factors for bleeding include an age of 75 years or older.  The bleeding index is 'intermediate risk'.  Positive CHADS2 values include History of HTN and Age > 63 years old.  Her last INR was 5.1 ratio.  Anticoagulation responsible provider: Daleen Squibb MD, Maisie Fus.  INR POC: 1.7.  Cuvette Lot#: [id number].  Exp: 04/2011.    Anticoagulation Management Assessment/Plan:      The patient's current anticoagulation dose is Warfarin sodium 2.5 mg tabs: Use as directed by Anticoagualtion Clinic.  The target INR is 2.0-3.0.  The next INR is due 03/07/2010.  Anticoagulation instructions were given to patient.  Results were reviewed/authorized by Weston Brass, PharmD.  She was notified by Weston Brass PharmD.         Prior Anticoagulation Instructions: INR 3.2  Start taking 1 tablet daily except 1/2 tablet on Mondays and Fridays. Recheck in 2 weeks.    Current Anticoagulation Instructions: INR 1.7  Take 1 1/2 tablets today then resume same dose of 1 tablet every day except 1/2 tablet on Monday and Friday.

## 2010-08-28 NOTE — Medication Information (Signed)
Summary: rov/mw  Anticoagulant Therapy  Managed by: Weston Brass, PharmD Referring MD: Clifton James MD, Cristal Deer PCP: Hunt Oris MD: Jens Som MD, Arlys John Indication 1: Atrial Fibrillation Lab Used: [facility] [place] Site: [address] INR POC 2.1 INR RANGE 2.0-3.0  Dietary changes: no    Health status changes: no    Bleeding/hemorrhagic complications: no    Recent/future hospitalizations: no    Any changes in medication regimen? yes       Details: started Tikosyn and spironolactone last month  Recent/future dental: no  Any missed doses?: no       Is patient compliant with meds? yes       Allergies: 1)  ! Sulfa 2)  ! Pradaxa (Dabigatran Etexilate Mesylate)  Anticoagulation Management History:      The patient is taking warfarin and comes in today for a routine follow up visit.  Positive risk factors for bleeding include an age of 75 years or older.  The bleeding index is 'intermediate risk'.  Positive CHADS2 values include History of HTN and Age > 3 years old.  Her last INR was 2.7 ratio.  Anticoagulation responsible Laquasia Pincus: Jens Som MD, Arlys John.  INR POC: 2.1.  Cuvette Lot#: [id number].  Exp: 04/2011.    Anticoagulation Management Assessment/Plan:      The patient's current anticoagulation dose is Warfarin sodium 2.5 mg tabs: Use as directed by Anticoagualtion Clinic.  The target INR is 2.0-3.0.  The next INR is due 05/29/2010.  Anticoagulation instructions were given to patient.  Results were reviewed/authorized by Weston Brass, PharmD.  She was notified by Weston Brass PharmD.         Prior Anticoagulation Instructions: INR 2.7  Continue taking 1 tablet on all days except monday take 1/2 tablet. See you in 4 weeks.  Current Anticoagulation Instructions: INR 2.1  Continue same dose of 1 tablet every day except 1/2 tablet on Monday.  Recheck INR in 4 weeks.

## 2010-08-28 NOTE — Medication Information (Signed)
Summary: Coumadin Clinic  Anticoagulant Therapy  Managed by: Inactive Referring MD: Clifton James MD, Cristal Deer PCP: Hunt Oris MD: Graciela Husbands MD, Viviann Spare Indication 1: Atrial Fibrillation Lab Used: [facility] [place] Site: [address] INR RANGE 2.0-3.0          Comments: Pt switching to Pradaxa.  Allergies: 1)  ! Sulfa  Anticoagulation Management History:      Positive risk factors for bleeding include an age of 75 years or older.  The bleeding index is 'intermediate risk'.  Positive CHADS2 values include History of HTN and Age > 75 years old.  Her last INR was 5.1 ratio.  Anticoagulation responsible provider: Graciela Husbands MD, Viviann Spare.  Exp: 02/2011.    Anticoagulation Management Assessment/Plan:      The target INR is 2.0-3.0.  The next INR is due 02/13/2010.  Anticoagulation instructions were given to patient.  Results were reviewed/authorized by Inactive.         Prior Anticoagulation Instructions: INR 3.2  Start taking 1 tablet daily except 1/2 tablet on Mondays and Fridays. Recheck in 2 weeks.

## 2010-08-28 NOTE — Progress Notes (Signed)
Summary: pre auth needed  Phone Note From Pharmacy   Caller: [name] [phone] Summary of Call: pt's tikosyn needs a pre auth before can be refilled-pt only has enough thru friday-pls call  Initial call taken by: Glynda Jaeger,  March 29, 2010 11:32 AM  Follow-up for Phone Call        Called to initiate prior authorization.  Pt is not set up through [employer] so coverage review and plan set up initiated today.  Pt insurance will be checked per representative and shipment will be sent out overnight.  Follow-up by: Judithe Modest CMA,  March 30, 2010 9:56 AM  Additional Follow-up for Phone Call Additional follow up Details #1::        Rx faxed to [employer] and 2nd request made on prior authorization by Scherrie Bateman, RN.  Rx approved.  [employer] will overnight Rx to patient Additional Follow-up by: Judithe Modest CMA,  March 30, 2010 3:39 PM

## 2010-08-28 NOTE — Progress Notes (Signed)
Summary: PT HAS BEEN HAVING BACK PAIN AND THINKS IT IS DUE TO TIKOSYN  Phone Note Call from Patient Call back at Home Phone [phone]   Caller: PT Reason for Call: Talk to Nurse Summary of Call: PT JUST STARTED TAKE TIKOSYN FOR ABOUT THREE WEEKS NOW AND SHE FEELS THAT THIS IS CAUSING HER BACK PAIN OR KIDNEY PAIN, SHE HAS BEEN HAVING THIS PAIN FOR A COUPLE WEEKS. Initial call taken by: Faythe Ghee,  April 24, 2010 10:03 AM  Follow-up for Phone Call        spoke w/pt and after reviewing med w/pharmacy and feel that issue is probably urinary related. pt will f/u w/pcp and let us know if additional f/u is needed.  did chg her appt to 10/3.  Follow-up by: Claris Gladden RN,  April 24, 2010 10:37 AM

## 2010-08-28 NOTE — Progress Notes (Signed)
Summary: heart hurting  Phone Note Call from Patient Call back at Home Phone [phone]   Caller: Patient Reason for Call: Talk to Nurse Summary of Call: pt c/o of heart hurting. Wants to know if she needs to be seen sooner than 2 week [facility] appt. please call pt  Initial call taken by: Edman Circle,  October 09, 2009 8:41 AM  Follow-up for Phone Call        SPOKE WITH PT C/O FATIGUE  CHEST TIGHTNESS SAME AS PRIOR TO ADMISSION D-DIMER NORM  WAS JUST DISCHARGED YESTERDAY. PT INSTRUCTED TO MONITOR  HEART RATE AND B/P  AND TO GIVE NEW MED A COUPLE OF MORE DAYS TO TX  SYMPTOMS INSTRUCTED IF S/S WORSEN TO CALL OFFICE BACK AND WILL ALSO CALL IN COUPLE OF DAYS IF NO IMPROVEMENT.VERBALIZED UNDERSTANDING .  Follow-up by: Scherrie Bateman, LPN,  October 09, 2009 8:57 AM

## 2010-08-28 NOTE — Progress Notes (Signed)
Summary: c/o sob. problems with meds  Phone Note Call from Patient Call back at [facility] Phone [phone]   Caller: Patient Reason for Call: Talk to Nurse Complaint: Breathing Problems Summary of Call: per pt calling, having problems with some of her meds. SPIRONOLACTONE 25 MG . c/o breathing problems.  Initial call taken by: Lorne Skeens,  May 28, 2010 9:31 AM  Follow-up for Phone Call        pt states  that since on sprionolactone has had a cough and throat dry and bladder infection. chest feels tight and that cannot breathe well even at rest. pain in center of chest comes and goes and denies that it radiates. h/r at 74 at bp last night was 108/84 and she stated that diastolic has been as low as 68.  she will come in to see Dr. Graciela Husbands at 2pm. Follow-up by: Claris Gladden RN,  May 28, 2010 9:48 AM

## 2010-08-28 NOTE — Assessment & Plan Note (Signed)
Summary: rov-shob,chest tight-2:00 appt   Visit Type:  Follow-up Primary Provider:  Ricki Miller  CC:  shortness of breath and chest pain.  History of Present Illness: Mrs [name] is seen in followup for atrial fibrillation for which she is currently taking Tikosyn.  She says she is finally feeling better. She's had very infrequent palpitations. Her energy level is improved. She does have mild headaches temporally related to her Tikosyn.  She has some complaints of shortness of breath and chest pain which she attributes to allergies. This is been a seasonal issue for her.her blood pressure is much improved  She had a stress echo and a normal echo July 2000 10 per cardiology; these were both normal.  Problems Prior to Update: 1)  Sick Sinus/ Tachy-brady Syndrome  (ICD-427.81) 2)  Hypertension, Benign  (ICD-401.1) 3)  Atrial Fibrillation  (ICD-427.31) 4)  Svt/ Psvt/ Pat  (ICD-427.0) 5)  Chest Pain Unspecified  (ICD-786.50)  Current Medications (verified): 1)  Metoprolol Succinate 50 Mg Xr24h-Tab (Metoprolol Succinate) .... Take One Tablet By Mouth Daily 2)  Zocor 20 Mg Tabs (Simvastatin) .[name].. 1 Tab At Bedtime 3)  Levoxyl 50 Mcg Tabs (Levothyroxine Sodium) .[name].. 1 Tab Once Daily 4)  Allegra 180 Mg Tabs (Fexofenadine Hcl) .[name].. 1 Tab Once Daily 5)  Vitamin D 2000 Unit Tabs (Cholecalciferol) .[name].. 1 Tab Once Daily 6)  Fish Oil 1000 Mg Caps (Omega-3 Fatty Acids) .... Once Daily 7)  Multivitamins  Tabs (Multiple Vitamin) .... Once Daily 8)  Icaps  Caps (Multiple Vitamins-Minerals) .... Take 1 Daily 9)  Mucinex Dm 30-600 Mg Xr12h-Tab (Dextromethorphan-Guaifenesin) .... Prn 10)  Alprazolam 0.25 Mg Tabs (Alprazolam) .... Take 1 Tablet By Mouth At Bedtime As Needed For Sleep 11)  Cardizem 120 Mg Tabs (Diltiazem Hcl) .... Take 1 Capsule By Mouth Once A Day 12)  Micardis 80 Mg Tabs (Telmisartan) .... Take One Tablet in The Evening 13)  Warfarin Sodium 2.5 Mg Tabs (Warfarin Sodium) .... Use As Directed By  Anticoagualtion Clinic 14)  Tikosyn 125 Mcg Caps (Dofetilide) .[name].. 1 Two Times A Day 15)  Spironolactone 25 Mg Tabs (Spironolactone) .... Take One Half  Tablet By Mouth Daily  Allergies (verified): 1)  ! Sulfa 2)  ! Pradaxa (Dabigatran Etexilate Mesylate)  Past History:  Past Medical History: Last updated: 03/15/2010 atrial fibrillation failure to tolerate Pradaxa secondary to GI symptoms 1st degree AV block aggravated by flecainide 1.  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     The patient reports having 1 Holter monitor in the past as        recently as this past summer with [facility].   2. History of chest pain in the setting of palpitations.       a.     The patient reports normal catheterization about 10 years        ago, performed by Dr. Aleen Campi.       b.     Normal stress test in 2010, performed by Dr. Reyes Ivan.   3. Hypertension.   4. Hypothyroidism.   5. Hyperlipidemia.   6. Gastroesophageal reflux disease.   7. Seasonal allergies.   Past Surgical History: Last updated: 10-04-2009  1. Status post hysterectomy.   2. Status post laparoscopic bilateral salpingo-oophorectomy.   3. Knee surgery-right, arthroscopic  Family History: Last updated: 2009/10/04 Mother-101 when she died Father-died age 61 from cardiac issues 2 children both alive and healthy  Social History: Last updated: 10/04/09 Married  and lives here in [place] with her husband.  She has two children, two grandchilren and three great grandchildren. She does not smoke, use drugs or alcohol.  Does not work outside the home Enjoys reading and going to the beach  Vital Signs:  Patient profile:   77 year old female Height:      62 inches Weight:      133.50 pounds BMI:     24.51 Pulse rate:   60 / minute BP sitting:   116 / 60  (left arm) Cuff size:   regular  Vitals Entered By: Caralee Ates CMA (May 28, 2010 2:22 PM)  Physical  Exam  General:  The patient was alert and oriented in no acute distress. HEENT Normal.  Neck veins were flat, carotids were brisk.  Lungs were clear.  Heart sounds were regular without murmurs or gallops.  Abdomen was soft with active bowel sounds. There is no clubbing cyanosis or edema. Skin Warm and dry    Impression & Recommendations:  Problem # 1:  HYPERLIPIDEMIA (ICD-272.4) given her rather nonspecific diffuse complaints, we'll discontinue this and a statin. Further down titration would be appropriate not withstanding given her concomitant use of diltiazem The following medications were removed from the medication list:    Zocor 20 Mg Tabs (Simvastatin) .[name]... 1 tab at bedtime  Problem # 2:  HYPERTENSION, BENIGN (ICD-401.1) blood pressure today is 1:15; with the intercurrent addition of metoprolol Cardizem and Aldactone the latter for maintaining potassium levels, I think we can stop her Benicar. The following medications were removed from the medication list:    Micardis 80 Mg Tabs (Telmisartan) .[name]... Take one tablet in the evening Her updated medication list for this problem includes:    Metoprolol Succinate 50 Mg Xr24h-tab (Metoprolol succinate) .[name]... Take one tablet by mouth daily    Cardizem 120 Mg Tabs (Diltiazem hcl) .[name]... Take 1 capsule by mouth once a day    Spironolactone 25 Mg Tabs (Spironolactone) .[name]... Take one half  tablet by mouth daily  Problem # 3:  ATRIAL FIBRILLATION (ICD-427.31)  saline sinus rhythm on Tikosyn; we'll check her potassium and magnesium levels today  Her updated medication list for this problem includes:    Metoprolol Succinate 50 Mg Xr24h-tab (Metoprolol succinate) .[name]... Take one tablet by mouth daily    Warfarin Sodium 2.5 Mg Tabs (Warfarin sodium) ..... Use as directed by anticoagualtion clinic    Tikosyn 125 Mcg Caps (Dofetilide) .[name]... 1 two times a day  Problem # 4:  CHEST PAIN UNSPECIFIED (ICD-786.50)  I will certainly noncardiac  given her relatively recent negative stress test  Her updated medication list for this problem includes:    Metoprolol Succinate 50 Mg Xr24h-tab (Metoprolol succinate) .[name]... Take one tablet by mouth daily    Cardizem 120 Mg Tabs (Diltiazem hcl) .[name]... Take 1 capsule by mouth once a day    Warfarin Sodium 2.5 Mg Tabs (Warfarin sodium) ..... Use as directed by anticoagualtion clinic  Other Orders: TLB-BMP (Basic Metabolic Panel-BMET) (80048-METABOL) TLB-Magnesium (Mg) (83735-MG)  Patient Instructions: 1)  Your physician has recommended you make the following change in your medication: Stop Zocor and Micardis.  2)  Your physician recommends that you have these labs today:  BMET and Mag

## 2010-08-30 NOTE — Medication Information (Signed)
Summary: rov/sp  Anticoagulant Therapy  Managed by: Bethena Midget, RN, BSN Referring MD: Clifton James MD, Cristal Deer PCP: Hunt Oris MD: Graciela Husbands MD, Viviann Spare Indication 1: Atrial Fibrillation Lab Used: [facility] [place] Site: [address] INR POC 1.5 INR RANGE 2.0-3.0  Dietary changes: no    Health status changes: no    Bleeding/hemorrhagic complications: no    Recent/future hospitalizations: no    Any changes in medication regimen? no    Recent/future dental: no  Any missed doses?: no       Is patient compliant with meds? yes       Allergies: 1)  ! Sulfa 2)  ! Pradaxa (Dabigatran Etexilate Mesylate)  Anticoagulation Management History:      The patient is taking warfarin and comes in today for a routine follow up visit.  Positive risk factors for bleeding include an age of 36 years or older.  The bleeding index is 'intermediate risk'.  Positive CHADS2 values include History of HTN and Age > 45 years old.  Her last INR was 2.7 ratio.  Anticoagulation responsible provider: Graciela Husbands MD, Viviann Spare.  INR POC: 1.5.  Cuvette Lot#: [id number].  Exp: 08/2011.    Anticoagulation Management Assessment/Plan:      The patient's current anticoagulation dose is Warfarin sodium 2.5 mg tabs: Use as directed by Anticoagualtion Clinic.  The target INR is 2.0-3.0.  The next INR is due 07/27/2010.  Anticoagulation instructions were given to patient.  Results were reviewed/authorized by Bethena Midget, RN, BSN.  She was notified by Bethena Midget, RN, BSN.         Prior Anticoagulation Instructions: INR 1.5  Take 1 1/2 tablets today and tomorrow then resume same dose of 1 tablet every day except 1/2 tablet on Monday.  Recheck INR in 2 weeks.   Current Anticoagulation Instructions: INR 1.5 Today take 2 pills then change dose to 1 pill everyday. REcheck in 2 weeks.

## 2010-08-30 NOTE — Medication Information (Signed)
Summary: rov/ewj  Anticoagulant Therapy  Managed by: Bethena Midget, RN, BSN Referring MD: Clifton James MD, Cristal Deer PCP: Hunt Oris MD: Daleen Squibb MD, Maisie Fus Indication 1: Atrial Fibrillation Lab Used: [facility] [place] Site: [address] INR POC 2.6 INR RANGE 2.0-3.0  Dietary changes: no    Health status changes: no    Bleeding/hemorrhagic complications: no    Recent/future hospitalizations: no    Any changes in medication regimen? no    Recent/future dental: no  Any missed doses?: no       Is patient compliant with meds? yes       Allergies: 1)  ! Sulfa 2)  ! Pradaxa (Dabigatran Etexilate Mesylate)  Anticoagulation Management History:      The patient is taking warfarin and comes in today for a routine follow up visit.  Positive risk factors for bleeding include an age of 75 years or older.  The bleeding index is 'intermediate risk'.  Positive CHADS2 values include History of HTN and Age > 76 years old.  Her last INR was 2.7 ratio.  Anticoagulation responsible provider: Daleen Squibb MD, Maisie Fus.  INR POC: 2.6.  Cuvette Lot#: [id number].  Exp: 08/2011.    Anticoagulation Management Assessment/Plan:      The patient's current anticoagulation dose is Warfarin sodium 2.5 mg tabs: Use as directed by Anticoagualtion Clinic.  The target INR is 2.0-3.0.  The next INR is due 08/28/2010.  Anticoagulation instructions were given to patient.  Results were reviewed/authorized by Bethena Midget, RN, BSN.  She was notified by Bethena Midget, RN, BSN.         Prior Anticoagulation Instructions: INR 1.5  Take 2 tablets today, then start taking 1 tablet daily except 2 tablets on Mondays.  Recheck in 2 weeks.    Current Anticoagulation Instructions: INR 2.6 Continue 1 pill everyday except 2 pills on Mondays. Recheck in 2 weeks.

## 2010-08-30 NOTE — Assessment & Plan Note (Signed)
Summary: rov   Visit Type:  rov Primary Provider:  Ricki Miller  CC:  headache and CP.  History of Present Illness: [name] is seen in followup for atrial fibrillation for which she is currently taking Tikosyn.  She says she is finally feeling better. She's had very infrequent palpitations. Her energy level is improved. She does have mild headaches temporally related to her Tikosyn.  She has some complaints of shortness of breath and chest pain which she attributes to allergies. This is been a seasonal issue for her.her blood pressure is much improved  She had a stress echo and a normal echo July 2000 10 per cardiology; these were both normal.  Her BP have been modestly elevated in the 150 range  Problems Prior to Update: 1)  Hyperlipidemia  (ICD-272.4) 2)  Sick Sinus/ Tachy-brady Syndrome  (ICD-427.81) 3)  Hypertension, Benign  (ICD-401.1) 4)  Atrial Fibrillation  (ICD-427.31) 5)  Svt/ Psvt/ Pat  (ICD-427.0) 6)  Chest Pain Unspecified  (ICD-786.50)  Current Medications (verified): 1)  Metoprolol Succinate 50 Mg Xr24h-Tab (Metoprolol Succinate) .... Take One Tablet By Mouth Daily 2)  Levoxyl 50 Mcg Tabs (Levothyroxine Sodium) .[name].. 1 Tab Once Daily 3)  Allegra 180 Mg Tabs (Fexofenadine Hcl) .[name].. 1 Tab Once Daily 4)  Vitamin D 2000 Unit Tabs (Cholecalciferol) .[name].. 1 Tab Once Daily 5)  Fish Oil 1000 Mg Caps (Omega-3 Fatty Acids) .... Once Daily 6)  Multivitamins  Tabs (Multiple Vitamin) .... Once Daily 7)  Icaps  Caps (Multiple Vitamins-Minerals) .... Take 1 Daily 8)  Mucinex Dm 30-600 Mg Xr12h-Tab (Dextromethorphan-Guaifenesin) .... Prn 9)  Alprazolam 0.25 Mg Tabs (Alprazolam) .... Take 1 Tablet By Mouth At Bedtime As Needed For Sleep 10)  Cardizem 120 Mg Tabs (Diltiazem Hcl) .... Take 1 Capsule By Mouth Once A Day 11)  Warfarin Sodium 2.5 Mg Tabs (Warfarin Sodium) .... Use As Directed By Anticoagualtion Clinic 12)  Tikosyn 125 Mcg Caps (Dofetilide) .[name].. 1 Two Times A Day 13)   Spironolactone 25 Mg Tabs (Spironolactone) .... Take One Half  Tablet By Mouth Daily  Allergies (verified): 1)  ! Sulfa 2)  ! Pradaxa (Dabigatran Etexilate Mesylate)  Past History:  Past Medical History: Last updated: 03/15/2010 atrial fibrillation failure to tolerate Pradaxa secondary to GI symptoms 1st degree AV block aggravated by flecainide 1.  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     The patient reports having 1 Holter monitor in the past as        recently as this past summer with [facility].   2. History of chest pain in the setting of palpitations.       a.     The patient reports normal catheterization about 10 years        ago, performed by Dr. Aleen Campi.       b.     Normal stress test in 2010, performed by Dr. Reyes Ivan.   3. Hypertension.   4. Hypothyroidism.   5. Hyperlipidemia.   6. Gastroesophageal reflux disease.   7. Seasonal allergies.   Past Surgical History: Last updated: 09/13/09  1. Status post hysterectomy.   2. Status post laparoscopic bilateral salpingo-oophorectomy.   3. Knee surgery-right, arthroscopic  Family History: Last updated: 09-13-09 Mother-101 when she died Father-died age 34 from cardiac issues 2 children both alive and healthy  Social History: Last updated: 2009-09-13 Married and lives here in [place] with her husband.  She has two children, two grandchilren  and three great grandchildren. She does not smoke, use drugs or alcohol.  Does not work outside the home Enjoys reading and going to the beach  Vital Signs:  Patient profile:   77 year old female Height:      62 inches Weight:      133.50 pounds BMI:     24.51 Pulse rate:   76 / minute BP sitting:   155 / 75  (left arm) Cuff size:   regular  Vitals Entered By: Caralee Ates CMA (July 31, 2010 4:29 PM)  Physical Exam  General:  The patient was alert and oriented in no acute distress. HEENT Normal.  Neck veins  were flat, carotids were brisk.  Lungs were clear.  Heart sounds were regular without murmurs or gallops.  Abdomen was soft with active bowel sounds. There is no clubbing cyanosis or edema. Skin Warm and dry    Impression & Recommendations:  Problem # 1:  ATRIAL FIBRILLATION (ICD-427.31) well controlled by Joice Lofts,  will check Mag and K for surveillance  QT is ok Her updated medication list for this problem includes:    Metoprolol Succinate 50 Mg Xr24h-tab (Metoprolol succinate) .[name]... Take one tablet by mouth daily    Warfarin Sodium 2.5 Mg Tabs (Warfarin sodium) ..... Use as directed by anticoagualtion clinic    Tikosyn 125 Mcg Caps (Dofetilide) .[name]... 1 two times a day  Problem # 2:  HYPERTENSION, BENIGN (ICD-401.1) elevated systolic will resume micardis at half strength and re check BMET in two week Her updated medication list for this problem includes:    Metoprolol Succinate 50 Mg Xr24h-tab (Metoprolol succinate) .[name]... Take one tablet by mouth daily    Cardizem 120 Mg Tabs (Diltiazem hcl) .[name]... Take 1 capsule by mouth once a day    Spironolactone 25 Mg Tabs (Spironolactone) .[name]... Take one half  tablet by mouth daily  Problem # 3:  HEADACHE (ICD-784.0) presumed 2/2 tiikosyn; no other neurological symptoms and quite chronic Her updated medication list for this problem includes:    Metoprolol Succinate 50 Mg Xr24h-tab (Metoprolol succinate) .[name]... Take one tablet by mouth daily  Problem # 4:  CHEST PAIN UNSPECIFIED (ICD-786.50) non cardiac wth neg workup over recent years Her updated medication list for this problem includes:    Metoprolol Succinate 50 Mg Xr24h-tab (Metoprolol succinate) .[name]... Take one tablet by mouth daily    Cardizem 120 Mg Tabs (Diltiazem hcl) .[name]... Take 1 capsule by mouth once a day    Warfarin Sodium 2.5 Mg Tabs (Warfarin sodium) ..... Use as directed by anticoagualtion clinic  Patient Instructions: 1)  Your physician recommends that you return for lab work  in: Aug 13, 2010-BMET, MAG 2)  Your physician has recommended you make the following change in your medication: Restart Micardis. 3)  Your physician wants you to follow-up in: 6 months  You will receive a reminder letter in the mail two months in advance. If you don't receive a letter, please call our office to schedule the follow-up appointment.

## 2010-08-30 NOTE — Progress Notes (Signed)
Summary: pt may need sooner appt  Phone Note Call from Patient Call back at Home Phone [phone]   Caller: Patient Reason for Call: Talk to Nurse, Talk to Doctor Summary of Call: pt has chest pain off and on last week and some SOB and wants to talk about it and maybe coming in for sooner appt Initial call taken by: Omer Jack,  July 16, 2010 11:39 AM  Follow-up for Phone Call        spoke w/pt and last time she had cp was a week ago and rated pain 2/10 and described as pressure-"feels like fluid in my chest."her ekg at pcp showed nsr per pt. pt states she feels like she can't breath when she is in a-fib. adv pt if it becomes more freq to call or see emergency care. we will see her 1/3 3:45 pm. she is wondering if she needs a micardis type drug and if ok for b12 shot. adv do not see prob w/b12 shot and md to be back on Thursday. Follow-up by: Claris Gladden RN,  July 16, 2010 4:09 PM  Additional Follow-up for Phone Call Additional follow up Details #1::        not sure of issue wiht micardis drug  will see in jan as scheduled Additional Follow-up by: Nathen May, MD, [facility],  July 19, 2010 5:52 PM     Appended Document: pt may need sooner appt adv pt ok to take b12 and will discuss med changes on 1/3 appointment. pt requst refill of Metropolol 50mg  and I will get that done. Claris Gladden, RN, BSN     Clinical Lists Changes  Medications: Rx of METOPROLOL SUCCINATE 50 MG XR24H-TAB (METOPROLOL SUCCINATE) Take one tablet by mouth daily;  #90 x 3;  Signed;  Entered by: Claris Gladden RN;  Authorized by: Nathen May, MD, [facility];  Method used: [facility]  [phone]*, [address], [place], [place]  [address], Ph: [phone] or [phone], Fax: [phone]    Prescriptions: METOPROLOL SUCCINATE 50 MG XR24H-TAB (METOPROLOL SUCCINATE) Take one tablet by mouth daily  #90 x 3   Entered by:   Claris Gladden RN   Authorized by:    Nathen May, MD, [facility]   Signed by:   Claris Gladden RN on 07/19/2010   Method used:   [facility]ronically to        [facility]  [phone]* (retail)       [address] [place], [place]  [address]       Ph: [phone] or [phone]       Fax: [phone]   RxID:   [id number]

## 2010-08-30 NOTE — Progress Notes (Signed)
Summary: questions re med  Phone Note Call from Patient   Caller: Patient [phone] Reason for Call: Talk to Nurse Summary of Call: pt in yesterday and was told to cut med in half, however med is in a foil packet and she wonders if it will be ok to leave the other half open Initial call taken by: Glynda Jaeger,  August 02, 2010 9:53 AM  Follow-up for Phone Call        adv pt not sure about micardis being open w/o taking it. offered to order 40mg  and see that it does not come in generic. pt concerned about the cost. first off will see if ok to split pill and then go from there. pt expressed understanding.  Follow-up by: Claris Gladden RN,  August 02, 2010 6:53 PM     Appended Document: Orders Update adv pt that per pharmacy ok to leave half pill in foil pack for day. will call in 40mg  micardis for pt. pt express understanding. Claris Gladden, RN, BSN     Clinical Lists Changes  Medications: Added new medication of MICARDIS 40 MG TABS (TELMISARTAN) once daily - Signed Rx of MICARDIS 40 MG TABS (TELMISARTAN) once daily;  #30 x 11;  Signed;  Entered by: Claris Gladden RN;  Authorized by: Nathen May, MD, [facility];  Method used: [facility]  [phone]*, [address] [place], [place]  [address], Ph: [phone] or [phone], Fax: [phone]    Prescriptions: MICARDIS 40 MG TABS (TELMISARTAN) once daily  #30 x 11   Entered by:   Claris Gladden RN   Authorized by:   Nathen May, MD, [facility]   Signed by:   Claris Gladden RN on 08/03/2010   Method used:   Electronically to        [facility]  [phone]* (retail)       [address] [place], [place]  [address]       Ph: [phone] or [phone]       Fax: [phone]   RxID:   [id number]

## 2010-08-30 NOTE — Medication Information (Signed)
Summary: rov/tm  Anticoagulant Therapy  Managed by: Cloyde Reams, RN, BSN Referring MD: Clifton James MD, Cristal Deer PCP: Hunt Oris MD: Clifton James MD, Cristal Deer Indication 1: Atrial Fibrillation Lab Used: [facility] [place] Site: [address] INR POC 1.5 INR RANGE 2.0-3.0  Dietary changes: no    Health status changes: no    Bleeding/hemorrhagic complications: no    Recent/future hospitalizations: yes       Details: Upcoming cateract surgery in March?  Any changes in medication regimen? no    Recent/future dental: no  Any missed doses?: no       Is patient compliant with meds? yes       Allergies: 1)  ! Sulfa 2)  ! Pradaxa (Dabigatran Etexilate Mesylate)  Anticoagulation Management History:      The patient is taking warfarin and comes in today for a routine follow up visit.  Positive risk factors for bleeding include an age of 58 years or older.  The bleeding index is 'intermediate risk'.  Positive CHADS2 values include History of HTN and Age > 67 years old.  Her last INR was 2.7 ratio.  Anticoagulation responsible provider: Clifton James MD, Cristal Deer.  INR POC: 1.5.  Cuvette Lot#: [id number].  Exp: 04/2011.    Anticoagulation Management Assessment/Plan:      The patient's current anticoagulation dose is Warfarin sodium 2.5 mg tabs: Use as directed by Anticoagualtion Clinic.  The target INR is 2.0-3.0.  The next INR is due 08/10/2010.  Anticoagulation instructions were given to patient.  Results were reviewed/authorized by Cloyde Reams, RN, BSN.  She was notified by Cloyde Reams RN.         Prior Anticoagulation Instructions: INR 1.5 Today take 2 pills then change dose to 1 pill everyday. REcheck in 2 weeks.   Current Anticoagulation Instructions: INR 1.5  Take 2 tablets today, then start taking 1 tablet daily except 2 tablets on Mondays.  Recheck in 2 weeks.

## 2010-09-05 NOTE — Medication Information (Signed)
Summary: rov/tm  Anticoagulant Therapy  Managed by: Bethena Midget, RN, BSN Referring MD: Clifton James MD, Cristal Deer PCP: Hunt Oris MD: Myrtis Ser MD, Tinnie Gens Indication 1: Atrial Fibrillation Lab Used: [facility] [place] Site: [address] INR POC 3.1 INR RANGE 2.0-3.0  Dietary changes: no    Health status changes: no    Bleeding/hemorrhagic complications: no    Recent/future hospitalizations: no    Any changes in medication regimen? no    Recent/future dental: no  Any missed doses?: no       Is patient compliant with meds? yes       Allergies: 1)  ! Sulfa 2)  ! Pradaxa (Dabigatran Etexilate Mesylate)  Anticoagulation Management History:      The patient is taking warfarin and comes in today for a routine follow up visit.  Positive risk factors for bleeding include an age of 51 years or older.  The bleeding index is 'intermediate risk'.  Positive CHADS2 values include History of HTN and Age > 89 years old.  Her last INR was 2.7 ratio.  Anticoagulation responsible provider: Myrtis Ser MD, Tinnie Gens.  INR POC: 3.1.  Cuvette Lot#: [id number].  Exp: 07/2011.    Anticoagulation Management Assessment/Plan:      The patient's current anticoagulation dose is Warfarin sodium 2.5 mg tabs: Use as directed by Anticoagualtion Clinic.  The target INR is 2.0-3.0.  The next INR is due 09/14/2010.  Anticoagulation instructions were given to patient.  Results were reviewed/authorized by Bethena Midget, RN, BSN.  She was notified by Bethena Midget, RN, BSN.         Prior Anticoagulation Instructions: INR 2.6 Continue 1 pill everyday except 2 pills on Mondays. Recheck in 2 weeks.   Current Anticoagulation Instructions: INR 3.1 Skip today's dose then change dose to 1 pill everyday. Recheck in 2-3 weeks.

## 2010-09-14 ENCOUNTER — Encounter (INDEPENDENT_AMBULATORY_CARE_PROVIDER_SITE_OTHER): Payer: Medicare Other

## 2010-09-14 ENCOUNTER — Encounter: Payer: Self-pay | Admitting: Cardiology

## 2010-09-14 DIAGNOSIS — Z7901 Long term (current) use of anticoagulants: Secondary | ICD-10-CM

## 2010-09-14 DIAGNOSIS — I4891 Unspecified atrial fibrillation: Secondary | ICD-10-CM

## 2010-09-14 LAB — CONVERTED CEMR LAB: POC INR: 3.2

## 2010-09-19 ENCOUNTER — Telehealth: Payer: Self-pay | Admitting: Internal Medicine

## 2010-09-19 NOTE — Medication Information (Signed)
Summary: rov/tm  Anticoagulant Therapy  Managed by: Windell Hummingbird, RN Referring MD: Clifton James MD, Cristal Deer PCP: Hunt Oris MD: Daleen Squibb MD, Maisie Fus Indication 1: Atrial Fibrillation Lab Used: [facility] [place] Site: [address] INR POC 3.2 INR RANGE 2.0-3.0  Dietary changes: no    Health status changes: yes       Details: Had stomach virus for 5 days, ending this past Monday.  Bleeding/hemorrhagic complications: no    Recent/future hospitalizations: no    Any changes in medication regimen? no    Recent/future dental: no  Any missed doses?: no       Is patient compliant with meds? yes       Allergies: 1)  ! Sulfa 2)  ! Pradaxa (Dabigatran Etexilate Mesylate)  Anticoagulation Management History:      The patient is taking warfarin and comes in today for a routine follow up visit.  Positive risk factors for bleeding include an age of 75 years or older.  The bleeding index is 'intermediate risk'.  Positive CHADS2 values include History of HTN and Age > 85 years old.  Her last INR was 2.7 ratio.  Anticoagulation responsible provider: Daleen Squibb MD, Maisie Fus.  INR POC: 3.2.  Cuvette Lot#: [id number].  Exp: 08/2011.    Anticoagulation Management Assessment/Plan:      The patient's current anticoagulation dose is Warfarin sodium 2.5 mg tabs: Use as directed by Anticoagualtion Clinic.  The target INR is 2.0-3.0.  The next INR is due 10/08/2010.  Anticoagulation instructions were given to patient.  Results were reviewed/authorized by Windell Hummingbird, RN.  She was notified by Windell Hummingbird, RN.         Prior Anticoagulation Instructions: INR 3.1 Skip today's dose then change dose to 1 pill everyday. Recheck in 2-3 weeks.   Current Anticoagulation Instructions: INR 3.2 Take 1/2 tablet today.  Then resume taking 1 tablet every day. Recheck in 3 weeks.

## 2010-09-25 NOTE — Progress Notes (Signed)
Summary: pt having chest discomfort  Phone Note Call from Patient Call back at Home Phone [phone]   Caller: Patient Reason for Call: Talk to Nurse, Talk to Doctor Summary of Call: pt is presantly having chest pain and has had it for the last couple of weeks and her chest gets tight sometimes worse than others. Pt is SOB and a little off balance but no other symptoms  Initial call taken by: Omer Jack,  September 19, 2010 4:25 PM  Follow-up for Phone Call        spoke with pt the pain has been going on for weeks.  It comes and goes.  She is under a lot of stress in taking care of her husband and has had a virus and a UTI.  She is going to take the antibiotice that was given to her for that.  If pain persist she will call us back.  She states her HR has been normal.  Discussed with Dr Graciela Husbands.  He agrees with plan and said she shpuld follow up with Dr Ricki Miller  in regard to her symptoms Dennis Bast, RN, BSN  September 19, 2010 5:32 PM

## 2010-10-08 ENCOUNTER — Encounter: Payer: Self-pay | Admitting: Cardiology

## 2010-10-08 ENCOUNTER — Encounter: Payer: Self-pay | Admitting: Internal Medicine

## 2010-10-08 ENCOUNTER — Encounter (INDEPENDENT_AMBULATORY_CARE_PROVIDER_SITE_OTHER): Payer: Medicare Other

## 2010-10-08 DIAGNOSIS — I4891 Unspecified atrial fibrillation: Secondary | ICD-10-CM

## 2010-10-08 DIAGNOSIS — Z7901 Long term (current) use of anticoagulants: Secondary | ICD-10-CM

## 2010-10-12 LAB — COMPREHENSIVE METABOLIC PANEL
ALT: 20 U/L (ref 0–35)
Albumin: 3.4 g/dL — ABNORMAL LOW (ref 3.5–5.2)
Alkaline Phosphatase: 84 U/L (ref 39–117)
Chloride: 99 mEq/L (ref 96–112)
Glucose, Bld: 87 mg/dL (ref 70–99)
Potassium: 4.1 mEq/L (ref 3.5–5.1)
Sodium: 135 mEq/L (ref 135–145)
Total Bilirubin: 0.5 mg/dL (ref 0.3–1.2)
Total Protein: 5.7 g/dL — ABNORMAL LOW (ref 6.0–8.3)

## 2010-10-12 LAB — BASIC METABOLIC PANEL
BUN: 21 mg/dL (ref 6–23)
BUN: 21 mg/dL (ref 6–23)
CO2: 27 mEq/L (ref 19–32)
CO2: 29 mEq/L (ref 19–32)
CO2: 29 mEq/L (ref 19–32)
CO2: 30 mEq/L (ref 19–32)
Calcium: 9.3 mg/dL (ref 8.4–10.5)
Calcium: 9.4 mg/dL (ref 8.4–10.5)
Chloride: 102 mEq/L (ref 96–112)
Chloride: 104 mEq/L (ref 96–112)
Chloride: 99 mEq/L (ref 96–112)
Chloride: 99 mEq/L (ref 96–112)
Creatinine, Ser: 1.07 mg/dL (ref 0.4–1.2)
GFR calc Af Amer: >60 mL/min (ref >60)
GFR calc Af Amer: >60 mL/min (ref >60)
Glucose, Bld: 123 mg/dL — ABNORMAL HIGH (ref 70–99)
Glucose, Bld: 129 mg/dL — ABNORMAL HIGH (ref 70–99)
Glucose, Bld: 82 mg/dL (ref 70–99)
Glucose, Bld: 85 mg/dL (ref 70–99)
Potassium: 3.7 mEq/L (ref 3.5–5.1)
Potassium: 3.7 mEq/L (ref 3.5–5.1)
Potassium: 3.9 mEq/L (ref 3.5–5.1)
Potassium: 5 mEq/L (ref 3.5–5.1)
Sodium: 135 mEq/L (ref 135–145)
Sodium: 135 mEq/L (ref 135–145)
Sodium: 136 mEq/L (ref 135–145)
Sodium: 137 mEq/L (ref 135–145)

## 2010-10-12 LAB — CBC
HCT: 34.7 % — ABNORMAL LOW (ref 36.0–46.0)
Platelets: 186 10*3/uL (ref 150–400)
RBC: 4.34 MIL/uL (ref 3.87–5.11)
RDW: 15.4 % (ref 11.5–15.5)
WBC: 6.7 10*3/uL (ref 4.0–10.5)

## 2010-10-12 LAB — PROTIME-INR
INR: 2.49 — ABNORMAL HIGH (ref 0.00–1.49)
INR: 2.59 — ABNORMAL HIGH (ref 0.00–1.49)

## 2010-10-15 LAB — BASIC METABOLIC PANEL
BUN: 16 mg/dL (ref 6–23)
BUN: 19 mg/dL (ref 6–23)
BUN: 17 mg/dL (ref 6–23)
CO2: 28 mEq/L (ref 19–32)
CO2: 25 mEq/L (ref 19–32)
Calcium: 8.9 mg/dL (ref 8.4–10.5)
Calcium: 9.2 mg/dL (ref 8.4–10.5)
Calcium: 8.9 mg/dL (ref 8.4–10.5)
Calcium: 9.7 mg/dL (ref 8.4–10.5)
Creatinine, Ser: 0.96 mg/dL (ref 0.4–1.2)
GFR calc Af Amer: >60 mL/min (ref >60)
GFR calc non Af Amer: 56 mL/min — ABNORMAL LOW (ref >60)
GFR calc non Af Amer: 51 mL/min — ABNORMAL LOW (ref >60)
GFR calc non Af Amer: 56 mL/min — ABNORMAL LOW (ref >60)
Glucose, Bld: 135 mg/dL — ABNORMAL HIGH (ref 70–99)
Glucose, Bld: 89 mg/dL (ref 70–99)
Glucose, Bld: 118 mg/dL — ABNORMAL HIGH (ref 70–99)
Potassium: 3.7 mEq/L (ref 3.5–5.1)
Sodium: 138 mEq/L (ref 135–145)

## 2010-10-15 LAB — CBC
HCT: 37.6 % (ref 36.0–46.0)
HCT: 33.6 % — ABNORMAL LOW (ref 36.0–46.0)
HCT: 37.5 % (ref 36.0–46.0)
Hemoglobin: 11.3 g/dL — ABNORMAL LOW (ref 12.0–15.0)
MCHC: 32.7 g/dL (ref 30.0–36.0)
MCHC: 33.5 g/dL (ref 30.0–36.0)
MCV: 79.6 fL (ref 78.0–100.0)
Platelets: 198 10*3/uL (ref 150–400)
Platelets: 241 10*3/uL (ref 150–400)
RBC: 4.26 MIL/uL (ref 3.87–5.11)
RDW: 14.3 % (ref 11.5–15.5)
RDW: 14.5 % (ref 11.5–15.5)
RDW: 14.1 % (ref 11.5–15.5)
WBC: 9.3 10*3/uL (ref 4.0–10.5)

## 2010-10-15 LAB — LIPID PANEL
Cholesterol: 176 mg/dL (ref 0–200)
HDL: 79 mg/dL (ref >39)

## 2010-10-15 LAB — CARDIAC PANEL(CRET KIN+CKTOT+MB+TROPI)
Relative Index: INVALID (ref 0.0–2.5)
Total CK: 52 U/L (ref 7–177)
Troponin I: 0.02 ng/mL (ref 0.00–0.06)
Troponin I: 0.02 ng/mL (ref 0.00–0.06)

## 2010-10-15 LAB — POCT CARDIAC MARKERS
CKMB, poc: <1 ng/mL — ABNORMAL LOW (ref 1.0–8.0)
CKMB, poc: <1 ng/mL — ABNORMAL LOW (ref 1.0–8.0)
Myoglobin, poc: 58.9 ng/mL (ref 12–200)
Myoglobin, poc: 115 ng/mL (ref 12–200)
Troponin i, poc: <0.05 ng/mL (ref 0.00–0.09)
Troponin i, poc: <0.05 ng/mL (ref 0.00–0.09)

## 2010-10-15 LAB — POCT I-STAT, CHEM 8
BUN: 21 mg/dL (ref 6–23)
Chloride: 105 mEq/L (ref 96–112)
Creatinine, Ser: 1.9 mg/dL — ABNORMAL HIGH (ref 0.4–1.2)
Potassium: 4 mEq/L (ref 3.5–5.1)
Sodium: 133 mEq/L — ABNORMAL LOW (ref 135–145)
TCO2: 13 mmol/L (ref 0–100)

## 2010-10-15 LAB — CK TOTAL AND CKMB (NOT AT ARMC)
CK, MB: 1.2 ng/mL (ref 0.3–4.0)
CK, MB: 1.9 ng/mL (ref 0.3–4.0)
Relative Index: INVALID (ref 0.0–2.5)
Total CK: 71 U/L (ref 7–177)

## 2010-10-15 LAB — DIFFERENTIAL
Basophils Absolute: 0 10*3/uL (ref 0.0–0.1)
Eosinophils Relative: 2 % (ref 0–5)
Lymphocytes Relative: 19 % (ref 12–46)

## 2010-10-15 LAB — D-DIMER, QUANTITATIVE: D-Dimer, Quant: 0.31 ug/mL-FEU (ref 0.00–0.48)

## 2010-10-15 LAB — GLUCOSE, CAPILLARY

## 2010-10-15 LAB — T4: T4, Total: 9.4 ug/dL (ref 5.0–12.5)

## 2010-10-15 LAB — TROPONIN I: Troponin I: 0.01 ng/mL (ref 0.00–0.06)

## 2010-10-15 LAB — BRAIN NATRIURETIC PEPTIDE: Pro B Natriuretic peptide (BNP): 212 pg/mL — ABNORMAL HIGH (ref 0.0–100.0)

## 2010-10-16 LAB — DIFFERENTIAL
Basophils Absolute: 0 10*3/uL (ref 0.0–0.1)
Basophils Relative: 1 % (ref 0–1)
Eosinophils Absolute: 0.1 10*3/uL (ref 0.0–0.7)
Neutro Abs: 6.2 10*3/uL (ref 1.7–7.7)
Neutrophils Relative %: 79 % — ABNORMAL HIGH (ref 43–77)

## 2010-10-16 LAB — POCT CARDIAC MARKERS
CKMB, poc: 1.3 ng/mL (ref 1.0–8.0)
Myoglobin, poc: 95.4 ng/mL (ref 12–200)
Troponin i, poc: <0.05 ng/mL (ref 0.00–0.09)

## 2010-10-16 LAB — COMPREHENSIVE METABOLIC PANEL
ALT: 42 U/L — ABNORMAL HIGH (ref 0–35)
Alkaline Phosphatase: 112 U/L (ref 39–117)
BUN: 20 mg/dL (ref 6–23)
CO2: 26 mEq/L (ref 19–32)
Chloride: 102 mEq/L (ref 96–112)
GFR calc non Af Amer: 45 mL/min — ABNORMAL LOW (ref >60)
Glucose, Bld: 138 mg/dL — ABNORMAL HIGH (ref 70–99)
Potassium: 3.4 mEq/L — ABNORMAL LOW (ref 3.5–5.1)
Sodium: 137 mEq/L (ref 135–145)
Total Bilirubin: 0.8 mg/dL (ref 0.3–1.2)
Total Protein: 7.4 g/dL (ref 6.0–8.3)

## 2010-10-16 LAB — URINALYSIS, ROUTINE W REFLEX MICROSCOPIC
Hgb urine dipstick: NEGATIVE
Nitrite: NEGATIVE
Protein, ur: NEGATIVE mg/dL
Specific Gravity, Urine: 1.007 (ref 1.005–1.030)
Urobilinogen, UA: 0.2 mg/dL (ref 0.0–1.0)

## 2010-10-16 LAB — CBC
HCT: 38.2 % (ref 36.0–46.0)
Hemoglobin: 12.8 g/dL (ref 12.0–15.0)
RBC: 4.83 MIL/uL (ref 3.87–5.11)
RDW: 14.2 % (ref 11.5–15.5)

## 2010-10-16 LAB — POCT I-STAT, CHEM 8
Chloride: 103 mEq/L (ref 96–112)
Glucose, Bld: 92 mg/dL (ref 70–99)
HCT: 34 % — ABNORMAL LOW (ref 36.0–46.0)
Potassium: 3.9 mEq/L (ref 3.5–5.1)
Sodium: 138 mEq/L (ref 135–145)

## 2010-10-16 LAB — PROTIME-INR
INR: 2.24 — ABNORMAL HIGH (ref 0.00–1.49)
INR: 2.37 — ABNORMAL HIGH (ref 0.00–1.49)
Prothrombin Time: 24.6 seconds — ABNORMAL HIGH (ref 11.6–15.2)
Prothrombin Time: 25.7 seconds — ABNORMAL HIGH (ref 11.6–15.2)

## 2010-10-16 LAB — URINE CULTURE

## 2010-10-16 LAB — TSH: TSH: 2.117 u[IU]/mL (ref 0.350–4.500)

## 2010-10-16 LAB — CK TOTAL AND CKMB (NOT AT ARMC): CK, MB: 1.5 ng/mL (ref 0.3–4.0)

## 2010-10-16 LAB — CARDIAC PANEL(CRET KIN+CKTOT+MB+TROPI)
CK, MB: 1.5 ng/mL (ref 0.3–4.0)
Total CK: 91 U/L (ref 7–177)
Troponin I: 0.02 ng/mL (ref 0.00–0.06)
Troponin I: <0.01 ng/mL (ref 0.00–0.06)

## 2010-10-16 LAB — TROPONIN I: Troponin I: 0.02 ng/mL (ref 0.00–0.06)

## 2010-10-16 NOTE — Medication Information (Signed)
Summary: rov/pc  Anticoagulant Therapy  Managed by: Cloyde Reams, RN, BSN Referring MD: Clifton Miryah Ralls MD, Cristal Deer PCP: Hunt Oris MD: Antoine Poche MD, Fayrene Fearing Indication 1: Atrial Fibrillation Lab Used: [facility] [place] Site: [address] INR POC 2.4 INR RANGE 2.0-3.0  Dietary changes: no    Health status changes: no    Bleeding/hemorrhagic complications: no    Recent/future hospitalizations: no    Any changes in medication regimen? no    Recent/future dental: no  Any missed doses?: no       Is patient compliant with meds? yes       Allergies: 1)  ! Sulfa 2)  ! Pradaxa (Dabigatran Etexilate Mesylate)  Anticoagulation Management History:      The patient is taking warfarin and comes in today for a routine follow up visit.  Positive risk factors for bleeding include an age of 75 years or older.  The bleeding index is 'intermediate risk'.  Positive CHADS2 values include History of HTN and Age > 56 years old.  Her last INR was 2.7 ratio.  Anticoagulation responsible provider: Antoine Poche MD, Fayrene Fearing.  INR POC: 2.4.  Cuvette Lot#: [id number].  Exp: 09/2011.    Anticoagulation Management Assessment/Plan:      The patient's current anticoagulation dose is Warfarin sodium 2.5 mg tabs: Use as directed by Anticoagualtion Clinic.  The target INR is 2.0-3.0.  The next INR is due 11/05/2010.  Anticoagulation instructions were given to patient.  Results were reviewed/authorized by Cloyde Reams, RN, BSN.  She was notified by Cloyde Reams RN.         Prior Anticoagulation Instructions: INR 3.2 Take 1/2 tablet today.  Then resume taking 1 tablet every day. Recheck in 3 weeks.  Current Anticoagulation Instructions: INR 2.4  Continue on same dosage 1 tablet daily.  Recheck in 4 weeks.

## 2010-10-22 LAB — CBC
Hemoglobin: 14.1 g/dL (ref 12.0–15.0)
MCV: 80.3 fL (ref 78.0–100.0)
MCV: 80.5 fL (ref 78.0–100.0)
Platelets: 207 10*3/uL (ref 150–400)
Platelets: 240 10*3/uL (ref 150–400)
RBC: 4.6 MIL/uL (ref 3.87–5.11)
RBC: 4.64 MIL/uL (ref 3.87–5.11)
RBC: 5.28 MIL/uL — ABNORMAL HIGH (ref 3.87–5.11)
WBC: 6.8 10*3/uL (ref 4.0–10.5)
WBC: 9.4 10*3/uL (ref 4.0–10.5)
WBC: 9.9 10*3/uL (ref 4.0–10.5)

## 2010-10-22 LAB — PROTIME-INR
INR: 1.09 (ref 0.00–1.49)
INR: 1.52 — ABNORMAL HIGH (ref 0.00–1.49)
Prothrombin Time: 13.8 seconds (ref 11.6–15.2)
Prothrombin Time: 14 seconds (ref 11.6–15.2)
Prothrombin Time: 14.5 seconds (ref 11.6–15.2)
Prothrombin Time: 18.2 seconds — ABNORMAL HIGH (ref 11.6–15.2)

## 2010-10-22 LAB — CARDIAC PANEL(CRET KIN+CKTOT+MB+TROPI)
CK, MB: 2.6 ng/mL (ref 0.3–4.0)
Relative Index: 2 (ref 0.0–2.5)
Total CK: 129 U/L (ref 7–177)
Total CK: 136 U/L (ref 7–177)
Troponin I: 0.01 ng/mL (ref 0.00–0.06)
Troponin I: 0.01 ng/mL (ref 0.00–0.06)

## 2010-10-22 LAB — POCT CARDIAC MARKERS
CKMB, poc: 1.6 ng/mL (ref 1.0–8.0)
CKMB, poc: 1.7 ng/mL (ref 1.0–8.0)
Troponin i, poc: <0.05 ng/mL (ref 0.00–0.09)
Troponin i, poc: <0.05 ng/mL (ref 0.00–0.09)

## 2010-10-22 LAB — FOLATE RBC: RBC Folate: 1295 ng/mL — ABNORMAL HIGH (ref 180–600)

## 2010-10-22 LAB — POCT I-STAT, CHEM 8
BUN: 28 mg/dL — ABNORMAL HIGH (ref 6–23)
Calcium, Ion: 1.09 mmol/L — ABNORMAL LOW (ref 1.12–1.32)
Chloride: 103 mEq/L (ref 96–112)
Glucose, Bld: 147 mg/dL — ABNORMAL HIGH (ref 70–99)
Potassium: 3.1 mEq/L — ABNORMAL LOW (ref 3.5–5.1)

## 2010-10-22 LAB — URINALYSIS, ROUTINE W REFLEX MICROSCOPIC
Glucose, UA: NEGATIVE mg/dL
Ketones, ur: NEGATIVE mg/dL
Protein, ur: NEGATIVE mg/dL
Urobilinogen, UA: 0.2 mg/dL (ref 0.0–1.0)

## 2010-10-22 LAB — BASIC METABOLIC PANEL
CO2: 26 mEq/L (ref 19–32)
Chloride: 107 mEq/L (ref 96–112)
Creatinine, Ser: 1.01 mg/dL (ref 0.4–1.2)
GFR calc Af Amer: >60 mL/min (ref >60)
Potassium: 4.5 mEq/L (ref 3.5–5.1)

## 2010-10-22 LAB — TSH: TSH: 0.669 u[IU]/mL (ref 0.350–4.500)

## 2010-10-22 LAB — URINE MICROSCOPIC-ADD ON

## 2010-10-22 LAB — COMPREHENSIVE METABOLIC PANEL
Albumin: 3.9 g/dL (ref 3.5–5.2)
Alkaline Phosphatase: 88 U/L (ref 39–117)
BUN: 7 mg/dL (ref 6–23)
CO2: 23 mEq/L (ref 19–32)
Chloride: 104 mEq/L (ref 96–112)
Creatinine, Ser: 0.87 mg/dL (ref 0.4–1.2)
GFR calc non Af Amer: >60 mL/min (ref >60)
Glucose, Bld: 123 mg/dL — ABNORMAL HIGH (ref 70–99)
Potassium: 3.3 mEq/L — ABNORMAL LOW (ref 3.5–5.1)
Total Bilirubin: 0.4 mg/dL (ref 0.3–1.2)

## 2010-10-22 LAB — DIFFERENTIAL
Lymphocytes Relative: 14 % (ref 12–46)
Lymphs Abs: 1.3 10*3/uL (ref 0.7–4.0)
Monocytes Absolute: 0.7 10*3/uL (ref 0.1–1.0)
Monocytes Relative: 7 % (ref 3–12)
Neutro Abs: 7.4 10*3/uL (ref 1.7–7.7)

## 2010-10-22 LAB — HEPARIN LEVEL (UNFRACTIONATED)
Heparin Unfractionated: 0.54 IU/mL (ref 0.30–0.70)
Heparin Unfractionated: 0.59 IU/mL (ref 0.30–0.70)

## 2010-10-22 LAB — MAGNESIUM
Magnesium: 1.6 mg/dL (ref 1.5–2.5)
Magnesium: 2 mg/dL (ref 1.5–2.5)

## 2010-10-22 LAB — TROPONIN I: Troponin I: 0.03 ng/mL (ref 0.00–0.06)

## 2010-10-26 ENCOUNTER — Other Ambulatory Visit: Payer: Self-pay | Admitting: Internal Medicine

## 2010-11-05 ENCOUNTER — Ambulatory Visit (INDEPENDENT_AMBULATORY_CARE_PROVIDER_SITE_OTHER): Payer: Medicare Other | Admitting: *Deleted

## 2010-11-05 DIAGNOSIS — Z7901 Long term (current) use of anticoagulants: Secondary | ICD-10-CM

## 2010-11-05 DIAGNOSIS — I4891 Unspecified atrial fibrillation: Secondary | ICD-10-CM

## 2010-11-05 LAB — POCT INR: INR: 3.2

## 2010-11-06 LAB — COMPREHENSIVE METABOLIC PANEL
AST: 19 U/L (ref 0–37)
Albumin: 3.5 g/dL (ref 3.5–5.2)
Albumin: 4.1 g/dL (ref 3.5–5.2)
Alkaline Phosphatase: 68 U/L (ref 39–117)
BUN: 20 mg/dL (ref 6–23)
BUN: 22 mg/dL (ref 6–23)
Calcium: 9 mg/dL (ref 8.4–10.5)
Creatinine, Ser: 1.02 mg/dL (ref 0.4–1.2)
Creatinine, Ser: 1.12 mg/dL (ref 0.4–1.2)
GFR calc Af Amer: >60 mL/min (ref >60)
Glucose, Bld: 130 mg/dL — ABNORMAL HIGH (ref 70–99)
Potassium: 3.9 mEq/L (ref 3.5–5.1)
Total Protein: 5.6 g/dL — ABNORMAL LOW (ref 6.0–8.3)
Total Protein: 6.5 g/dL (ref 6.0–8.3)

## 2010-11-06 LAB — DIFFERENTIAL
Basophils Absolute: 0 10*3/uL (ref 0.0–0.1)
Basophils Relative: 1 % (ref 0–1)
Lymphocytes Relative: 24 % (ref 12–46)
Monocytes Absolute: 0.5 10*3/uL (ref 0.1–1.0)
Monocytes Relative: 11 % (ref 3–12)
Neutro Abs: 2.9 10*3/uL (ref 1.7–7.7)
Neutrophils Relative %: 62 % (ref 43–77)

## 2010-11-06 LAB — CARDIAC PANEL(CRET KIN+CKTOT+MB+TROPI)
CK, MB: 1.1 ng/mL (ref 0.3–4.0)
CK, MB: 1.1 ng/mL (ref 0.3–4.0)
CK, MB: 1.2 ng/mL (ref 0.3–4.0)
Total CK: 69 U/L (ref 7–177)
Troponin I: 0.02 ng/mL (ref 0.00–0.06)

## 2010-11-06 LAB — LIPID PANEL
HDL: 65 mg/dL (ref >39)
LDL Cholesterol: 89 mg/dL (ref 0–99)
Total CHOL/HDL Ratio: 2.7 RATIO
Triglycerides: 115 mg/dL (ref <150)
VLDL: 23 mg/dL (ref 0–40)

## 2010-11-06 LAB — CALCIUM: Calcium: 9 mg/dL (ref 8.4–10.5)

## 2010-11-06 LAB — CBC
MCHC: 32.9 g/dL (ref 30.0–36.0)
Platelets: 194 10*3/uL (ref 150–400)
RDW: 13.9 % (ref 11.5–15.5)

## 2010-11-06 LAB — CK TOTAL AND CKMB (NOT AT ARMC)
CK, MB: 1.6 ng/mL (ref 0.3–4.0)
Relative Index: INVALID (ref 0.0–2.5)
Total CK: 96 U/L (ref 7–177)

## 2010-11-06 LAB — TROPONIN I: Troponin I: <0.01 ng/mL (ref 0.00–0.06)

## 2010-11-06 LAB — TSH: TSH: 0.871 u[IU]/mL (ref 0.350–4.500)

## 2010-11-06 LAB — MAGNESIUM: Magnesium: 2.1 mg/dL (ref 1.5–2.5)

## 2010-11-13 ENCOUNTER — Encounter: Payer: Self-pay | Admitting: Internal Medicine

## 2010-11-13 ENCOUNTER — Ambulatory Visit (INDEPENDENT_AMBULATORY_CARE_PROVIDER_SITE_OTHER): Payer: Medicare Other | Admitting: Internal Medicine

## 2010-11-13 DIAGNOSIS — R079 Chest pain, unspecified: Secondary | ICD-10-CM

## 2010-11-13 DIAGNOSIS — I4891 Unspecified atrial fibrillation: Secondary | ICD-10-CM

## 2010-11-13 NOTE — Patient Instructions (Signed)
Your physician recommends that you continue on your current medications as directed. Please refer to the Current Medication list given to you today. Your physician recommends that you schedule a follow-up appointment in: September  2012 WITH DR Graciela Husbands

## 2010-11-13 NOTE — Progress Notes (Signed)
  HPI  [name] is a 77 y.o. female Seen because of chest pain. She describes this as a tightness. It is persistent for hours at a time. It is not aggravated by exertion or by 18.  She is concerned though that she was in atrial fibrillation. She also has a great deal of stress as her husband is now in addition to his dementia on oxygen 24 7.  She has a history of atrial fibrillation for which he takes Tikosyn.  No past medical history on file.  No past surgical history on file.  Current Outpatient Prescriptions  Medication Sig Dispense Refill  . ALPRAZolam (XANAX) 0.25 MG tablet Take 0.25 mg by mouth at bedtime as needed.        . diltiazem (CARDIZEM) 120 MG tablet Take 120 mg by mouth. 1 EVERY DAY       . dofetilide (TIKOSYN) 125 MCG capsule Take 125 mcg by mouth 2 (two) times daily.        . fexofenadine (ALLEGRA) 180 MG tablet Take 180 mg by mouth daily.        [name] levothyroxine (SYNTHROID, LEVOTHROID) 50 MCG tablet Take 50 mcg by mouth daily.        . metoprolol (TOPROL-XL) 50 MG 24 hr tablet Take 50 mg by mouth daily.        . Multiple Vitamins-Minerals (ICAPS) CAPS Take by mouth.        . multivitamin (THERAGRAN) per tablet Take 1 tablet by mouth daily.        . Omega-3 Fatty Acids (FISH OIL) 1000 MG CAPS Take by mouth.        . spironolactone (ALDACTONE) 25 MG tablet Take 25 mg by mouth daily.        [name] telmisartan (MICARDIS) 40 MG tablet Take 40 mg by mouth daily.        [name] warfarin (COUMADIN) 2.5 MG tablet Take 1 tablet (2.5 mg total) by mouth as directed.  35 tablet  3  . Krill Oil 300 MG CAPS Take 1 each by mouth daily.          Allergies  Allergen Reactions  . Dabigatran   . Sulfonamide Derivatives     REACTION: hives    Review of Systems negative except from HPI and PMH  Physical Exam Well developed and well nourished in no acute distress HENT normal E scleral and icterus clear Neck Supple JVP flat; carotids brisk and full Clear to ausculation Regular rate  and rhythm, no murmurs gallops or rub Soft with active bowel sounds No clubbing cyanosis and edema Alert and oriented, grossly normal motor and sensory function Skin Warm and Dry  ECG Sinus at 68 Intervals 0.21/2007/24 2 Poor R wave progression but otherwise normal  Assessment and  Plan

## 2010-11-13 NOTE — Assessment & Plan Note (Signed)
Holding sinus rhythm on Tikosyn. Her potassium and magnesium were normal in January.

## 2010-11-13 NOTE — Assessment & Plan Note (Signed)
The patient has atypical chest pain. Cardiac evaluation in the past has included a stress echo about 2 years ago that was okay a catheterization about 10 years ago that was normal. I suspect that this may be related to stress. She will followup with her PCP

## 2010-11-27 ENCOUNTER — Ambulatory Visit (INDEPENDENT_AMBULATORY_CARE_PROVIDER_SITE_OTHER): Payer: Medicare Other | Admitting: *Deleted

## 2010-11-27 DIAGNOSIS — I4891 Unspecified atrial fibrillation: Secondary | ICD-10-CM

## 2010-11-27 MED ORDER — PANTOPRAZOLE SODIUM 20 MG PO TBEC: 20.0000 mg | DELAYED_RELEASE_TABLET | Freq: Every day | ORAL | Status: DC

## 2010-12-04 ENCOUNTER — Other Ambulatory Visit: Payer: Self-pay | Admitting: *Deleted

## 2010-12-04 MED ORDER — SPIRONOLACTONE 25 MG PO TABS: 25.0000 mg | ORAL_TABLET | Freq: Every day | ORAL | Status: DC

## 2010-12-11 ENCOUNTER — Telehealth: Payer: Self-pay | Admitting: Internal Medicine

## 2010-12-11 DIAGNOSIS — I10 Essential (primary) hypertension: Secondary | ICD-10-CM

## 2010-12-11 DIAGNOSIS — I4891 Unspecified atrial fibrillation: Secondary | ICD-10-CM

## 2010-12-11 NOTE — Discharge Summary (Signed)
NAME:  [name], [name]                ACCOUNT NO.:  [id number]   MEDICAL RECORD NO.:  [id number]          PATIENT TYPE:  INP   LOCATION:  1405                         FACILITY:  [facility]   PHYSICIAN:  Eduard Clos, MDDATE OF BIRTH:  [birth year]   DATE OF ADMISSION:  12/24/2008  DATE OF DISCHARGE:  12/25/2008                               DISCHARGE SUMMARY   COURSE IN THE HOSPITAL:  An 77 year old female with known history of  hypertension, hypothyroidism presented complaining of chest tightness.  The patient admitted to medical floor, was placed on telemetry monitor.  The patient also noted she had complaint of some palpitations and had  passed out 2 weeks ago, for which Dr. Ricki Miller ordered a Holter monitor  through Dr. Aleen Campi.  Presently, the patient's EKG only showed sinus  tachycardia at 106 beats per minute and during her stay in the hospital,  she did not have any arrhythmia, heartbeat around 80 beats per minute.  Cardiac enzymes are all negative.  At this time, the patient completely  asymptomatic and very eager to go home.  The patient was advised that  she will need close follow up with Dr. Ricki Miller and perhaps will need event  monitor along with stress test through Dr. Aleen Campi, for which she has  agreed.  There is no change in her medication.  At the time of  dictation, the patient is hemodynamically stable.   PROCEDURES DURING THE STAY:  Chest x-ray on Dec 24, 2008 showed nothing  acute.   FINAL DIAGNOSES:  1. Chest pain.  Rule out acute coronary syndrome.  2. Palpitations.  Presently rate controlled.  3. Hypertension.  4. Hypothyroidism.   PERTINENT LABS:  All cardiac enzymes were negative.   MEDICATIONS AT DISCHARGE:  1. Allegra 180 mg p.o. daily.  2. Aspirin 81 mg p.o. daily.  3. Benicar 20 mg p.o. daily.  4. Norvasc 5 mg p.o. daily.  5. Aphthoid eye drops.  6. Zocor 20 mg p.o. at bedtime.  7. Levoxyl 50 mg p.o. daily.   PLAN:  The patient advised to follow up  with Dr. Ricki Miller within a week's  time, and also to follow up with Dr. Aleen Campi.  The patient advised that  she will probably need an event monitor through cardiologist, to which  agreed.  To be on cardiac-healthy diet.  Activity as tolerated.      Eduard Clos, MD  Electronically Signed     ANK/MEDQ  D:  12/25/2008  T:  12/25/2008  Job:  [id number]   cc:   Juline Patch, M.D.  Fax: [phone]

## 2010-12-11 NOTE — Procedures (Signed)
CAROTID DUPLEX EXAM   INDICATION:  Prominent right carotid.   HISTORY:  Diabetes:  No.  Cardiac:  Irregular heartbeat.  Hypertension:  Yes.  Smoking:  No.  Previous Surgery:  No.  CV History:  No.  Amaurosis Fugax No, Paresthesias No, Hemiparesis No                                       RIGHT             LEFT  Brachial systolic pressure:         150               142  Brachial Doppler waveforms:         WNL               WNL  Vertebral direction of flow:        Antegrade         Antegrade  DUPLEX VELOCITIES (cm/sec)  CCA peak systolic                   77                73  ECA peak systolic                   82                62  ICA peak systolic                   68                99  ICA end diastolic                   16                28  PLAQUE MORPHOLOGY:                  None              None  PLAQUE AMOUNT:                      None              None  PLAQUE LOCATION:                    None              None   IMPRESSION:  1. No evidence of carotid stenosis bilaterally.  2. No evidence of carotid dilatation bilaterally or significant      diameter variance between sides.  3. Evidence of left ICA tortuosity.   ___________________________________________  Di Kindle. Edilia Bo, M.D.   AS/MEDQ  D:  01/19/2009  T:  01/19/2009  Job:  [id number]

## 2010-12-11 NOTE — H&P (Signed)
NAME:  [name], [name]                ACCOUNT NO.:  [id number]   MEDICAL RECORD NO.:  [id number]          PATIENT TYPE:  INP   LOCATION:  1405                         [facility]ACILITY:  [facility]   PHYSICIAN:  Zannie Cove, MD     DATE OF BIRTH:  25-Dec-1924   DATE OF ADMISSION:  12/24/2008  DATE OF DISCHARGE:                              HISTORY & PHYSICAL   PRIMARY CARE PHYSICIAN:  Dr. Ricki Miller.   CARDIOLOGIST:  Antionette Char, MD with [facility].   CHIEF COMPLAINT:  Chest tightness.   HISTORY OF PRESENTING ILLNESS:  [name] is an 77 year old white female  who presents to the ED after noticing a funny feeling in her chest  associated with tightness, which she describes as tightness this morning  while working in her yard which was associated with nausea and vomiting  and shortness of breath.  She at the time felt her heart rate to be  about 128.  This sensation lasted about 1-2 hours and improved only with  sublingual nitroglycerin x3.  She has felt her heart racing occasionally  for about 2 years off and on during which the event self-terminates.  About 2 years ago saw Dr. Aleen Campi who did a stress test which was  apparently normal and some other workup as well which was unrevealing.  In addition, also 2 weeks back she passed out and hence her primary care  recommended a Holter monitor which she had on for a day during which  they noticed a drop in her blood pressure and stopped one of her blood  pressure medicines.  She denies any excessive caffeine use or alcohol  use.  She denies any chest pain, any distinct sensation of chest pain.   PAST MEDICAL HISTORY:  Hypertension, dyslipidemia, hypothyroidism.   PAST SURGICAL HISTORY:  Significant for hysterectomy and exploratory  laparotomy with bilateral salpingo-oophorectomy.   MEDICATIONS:  1. Allegra 180 mg once a day.  2. Aspirin 81 mg once a day.  3. Benicar 20 mg once a day.  4. Norvasc 5 mg once a day.  5. Ocuvite eye drops.  6. Zocor 20 mg at bedtime.  7. Levoxyl 50 mg once a day.   ALLERGIES:  No known drug allergies.   SOCIAL HISTORY:  Lives at home with her husband.  Denies any smoking or  alcohol.   FAMILY HISTORY:  History of coronary artery disease in father in the 66s  and brothers in their 34s and 109s.   REVIEW OF SYSTEMS:  Review of systems is negative except for HPI.   FAMILY HISTORY:  Coronary disease.   REVIEW OF SYSTEMS:  No chest pain, shortness of breath currently.  No  blood in stools or black stools, no weight loss.  The rest of 12-system  review negative except per HPI.   PHYSICAL EXAMINATION:  VITAL SIGNS:  She is afebrile, pulse is 98, blood  pressure 131/86, respirations 16, satting 100% on 2 liters.  GENERAL:  She is alert and oriented x3.  HEENT:  Pupils equal, reactive to light.  Extraocular movements intact .  NECK:  No JVD.  No carotid bruits or visible carotid pulsations.  CARDIOVASCULAR:  S1, S2, regular rate and rhythm.  No murmurs, rubs or  gallops.  LUNGS:  Clear to auscultation bilaterally.  ABDOMEN:  Soft, nontender, positive bowel sounds.  EXTREMITIES:  No edema, clubbing, cyanosis.  NEUROLOGICAL:  Nonfocal.   LABS:  White count is 4.6, hemoglobin 12.4, platelets of ____.  Sodium  137, potassium 3.9, chloride 104, bicarb 25, BUN 22, creatinine 1.1,  glucose 130, D-dimer of 1.25, CK 96, MB 1.6, troponin less than 0.1.  EKG sinus tachycardia, rate of 105, left axis deviation, no ST-T changes  and unchanged from prior.   ASSESSMENT AND PLAN:  An 77 year old female with chest  tightness/palpitations suspect arrhythmias.  Will monitor in tele  overnight. first set cardiac enzymes and EKG were normal.  Will get two  more sets of enzymes to rule out for MI (myocardial infarction) since  there is risk factor of coronary artery disease, check an echo, get  magnesium level as well.  I expect she will need an outpatient stress  test when she is ruled out for an MI.   Continue aspirin and Benicar for  now.  We also need an event monitor .  1. Hypertension.  Stable.  Continue Benicar and Norvasc.  2. DVT (deep venous thrombosis) prophylaxis with Lovenox 30 mg subcu      daily.  3. The patient is a full code.      Zannie Cove, MD  Electronically Signed     PJ/MEDQ  D:  12/24/2008  T:  12/25/2008  Job:  [id number]

## 2010-12-11 NOTE — Telephone Encounter (Signed)
I called and spoke with the patient. She states that she has been taking Spironalactone 25mg  1/2 tablet once daily. Her printout states that she is on a whole tablet once daily. I do not see anywhere at her last visit where this was changed. I have advised her to continue on a 1/2 tablet once daily. I will correct this on her chart. She also states that  [facility] called her this morning and stated that her insurance will not fill her Micardis without special permission from the doctor. I will call [facility] on [address] to find out what is needed.

## 2010-12-11 NOTE — Telephone Encounter (Signed)
Pt has a question concerning her medications that Dr Graciela Husbands changed when she was here.

## 2010-12-11 NOTE — Telephone Encounter (Signed)
I called and spoke with the pharmacy. They state that insurance is requiring a generic ACE-I or to call for PA at [phone]. I have reviewed the patient's chart and do not see where she has been on any ACE-I's or had any reaction to them. I will review with Dr. Graciela Husbands prior to calling for PA.

## 2010-12-12 NOTE — Telephone Encounter (Signed)
I made the pt aware of Dr. Odessa Fleming recommendations. She is agreeable with stopping Micardis. She will track her bp's at home and call us should they start to run sbp's of 140 or above.

## 2010-12-12 NOTE — Telephone Encounter (Signed)
I can't find why the patient is not on an ACE inhibitor; her blood pressures have not been so severely high. It is a reasonable time to try and stop ARB thanks

## 2010-12-14 NOTE — Op Note (Signed)
   NAME:  [name], [name]                          ACCOUNT NO.:  [id number]   MEDICAL RECORD NO.:  [id number]                   PATIENT TYPE:  AMB   LOCATION:  ENDO                                 FACILITY:  [facility]   PHYSICIAN:  John C. Madilyn Fireman, M.D.                 DATE OF BIRTH:  02-25-1925   DATE OF PROCEDURE:  02/21/2003  DATE OF DISCHARGE:                                 OPERATIVE REPORT   PROCEDURE:  Esophagogastroduodenoscopy.   INDICATIONS FOR PROCEDURE:  A patient with an eight year history of  gastroesophageal reflux requiring chronic use of proton pump inhibitor who  is also undergoing screening colonoscopy today.   DESCRIPTION OF PROCEDURE:  The patient was placed in the left lateral  decubitus position then placed on the pulse monitor with continuous low flow  oxygen delivered by nasal cannula. She was sedated with 50 mcg IV fentanyl  and 5 mg IV Versed. The Olympus video endoscope was advanced under direct  vision into the oropharynx and esophagus. The esophagus was straight and of  normal caliber with the squamocolumnar line at 36 cm. It was somewhat  irregular in contour with 2 or 3 tongues of columnar epithelium extending  about 1-2 cm from the distal most margin at the GE  junction. There was on  exudate associated with these erosions. There appeared to be about 1 1/2 to  2 cm hiatal hernia cm with a patulous GE junction distal to the  squamocolumnar line. There was no visible ring or stricture and overall the  appearance did not suggest Barrett's esophagus. The stomach was entered and  a small amount of liquid secretions were suctioned from the fundus.  Retroflexed view of the cardia confirmed a small hiatal hernia and it was  otherwise unremarkable. The fundus, body, antrum and pylorus all appeared  normal. The duodenum was entered and both the bulb and second portion were  well inspected and appeared to be within normal limits. The scope was then  withdrawn and the  patient returned to the recovery room in stable condition.  She tolerated the procedure well and there were no immediate complications.   IMPRESSION:  1. Mild esophagitis with a small hiatal hernia.   PLAN:  1. Continue medical and dietary treatment for reflux.  2. Proceed with colonoscopy.                                               John C. Madilyn Fireman, M.D.    JCH/MEDQ  D:  02/21/2003  T:  02/21/2003  Job:  [id number]   cc:   Laqueta Linden, M.D.  [address]  [place]  [place] [address]  Fax: [phone]

## 2010-12-14 NOTE — Consult Note (Signed)
[facility]  Patient:    [name], [name] Visit Number: [id number] MRN: [id number]          Service Type: GON Location: GYN Attending Physician:  Jeannette Corpus Dictated by:   Rande Brunt. Clarke-Pearson, M.D. Proc. Date: 04/07/01 Admit Date:  04/07/2001   CC:         Telford Nab, R.N.  Laqueta Linden, M.D.   Consultation Report  HISTORY OF PRESENT ILLNESS:  A 77 year old white female referred by Dr. Laqueta Linden for evaluation of pelvic pain and multiple cystic masses in the ovaries.  The patient initially presented complaining of right lower quadrant pain and underwent evaluation with a CAT scan, revealing pelvic cysts.  She subsequently has had an ultrasound performed which shows the cysts to be bilateral.  On the right side, the cyst measures 3.9 x 3.6 x 3.2 cm and is multicystic.  The right side is smooth and marginated.  On the left side, the multicystic mass is slightly larger, measuring 4.4 x 5.0 x 6.1 cm, and there are two wall excrescences within one of the cysts measuring 6 x 6 and 5 x 5 mm.  All other cysts are smooth.  The patient has previously had a hysterectomy.  There is no evidence of free fluid.  The patient notes that her right lower quadrant pain has persisted.  She also has pressure on her bladder.  PAST MEDICAL HISTORY/MEDICAL ILLNESSES:  Hypertension and coronary artery disease.  She apparently had an angiogram in 2000 and had some vascular disease noted.  Recently, she has noted chest pressure and arm pain.  She is scheduled to see her cardiologist next week.  PAST SURGICAL HISTORY:  Hysterectomy 25 years ago for bleeding and endometriosis.  DRUG ALLERGIES:  SULFA.  CURRENT MEDICATIONS: 1. Hydrochlorothiazide. 2. Zebeta. 3. Dicyclomine 10 mg daily. 4. Allegra 60 mg a day. 5. Vitamin E 400 mg daily. 6. Protonix 40 mg daily. 7. The patient recently discontinued Premarin. 8. Os-Cal Plus vitamin D. 9.  Diovan.  SOCIAL HISTORY:  The patient is married and she has two living children.  She is a Futures trader.  She does not smoke or drink.  REVIEW OF SYSTEMS:  Otherwise negative.  She denies any GI, GU, neurologic, or pulmonary symptoms.  PHYSICAL EXAMINATION:  VITAL SIGNS:  Height 5 feet 3 inches, weight 148 pounds, blood pressure 162/90.  GENERAL:  The patient is a healthy white female appearing younger than stated age.  ABDOMEN:  Soft and nontender.  No masses, organomegaly, ascites, or hernias are noted.  There is no CVA tenderness.  EXTREMITIES:  Lower extremities without edema or varicosities.  PELVIC:  EGBUS normal.  The vagina is clean and well supported.  Bimanual and rectovaginal exam reveal palpable masses at the apex of the vagina.  They are minimally tender and feel somewhat adherent.  Rectovaginal exam confirms.  IMPRESSION:  Bilateral complex cysts with associated right lower quadrant pain.  While I believe these cysts are most likely benign, I do think they are symptomatic and should be removed surgically.  Prior to surgery, we would expect the patient to have a cardiac evaluation to be certain that surgery is within reason and safe.  It is noted the patients CA 125 value is 13 units/mL, which is somewhat reassuring.  We will contact Dr. Lonn Georgia office and coordinate surgery. Dictated by:   Rande Brunt. Clarke-Pearson, M.D. Attending Physician:  Jeannette Corpus DD:  04/07/01 TD:  04/07/01 Job: [id number]  [id number]

## 2010-12-14 NOTE — Op Note (Signed)
[name]  Patient:    [name], [name] Visit Number: [id number] MRN: [id number]          Service Type: GYN Location: 1S X006 01 Attending Physician:  Jenean Lindau Proc. Date: 04/21/01 Admit Date:  04/21/2001   CC:         [name], M.D.  John R. Aleen Campi, M.D.  Telford Nab, R.N.   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 Guilford Shi.  PROCEDURE PERFORMED:  Bilateral salpingo-oophorectomy.  SURGEON:  Laqueta Linden, M.D. Mammie LorenzoRande Brunt. Clarke-Pearson, M.D.  ANESTHESIA:  General endotracheal.  ESTIMATED BLOOD LOSS:  100 cc.  URINE OUTPUT:  100 cc.  FLUIDS:  1700 cc crystalloid.  COUNTS:  Correct x 2.  COMPLICATIONS:  None.  INDICATIONS:  [name] is a 77 year old menopausal female who underwent a total abdominal hysterectomy in the distant past for a benign process.  She presented complaining of some pain and had a CAT scan followed by an ultrasound, which revealed bilateral adnexal masses.  Ultrasound revealed a 3.9 x 3.6 x 3.2 cm multicystic mass.  The left multicystic mass measured 4.4 x 5.0 x 6.1 cm with two small excrescences within one of the cyst measuring 6 x 6 x 5.  CEA 125 was within normal limits at 13.  The patient was seen in consultation by Dr. De Blanch who will participate in the surgery and perform staging procedures if malignancy is encountered.  She has been cleared from the cardiology standpoint per Dr. Aleen Campi.  She has been counseled extensively by myself and Dr. Stanford Breed regarding risks, benefits, alternatives, and complications of the procedure as well as recovery expectations and agrees to proceed.  Full consent has been given.  She has received Ancef 1 g IV antibiotic prophylaxis  preoperatively.  DESCRIPTION OF PROCEDURE:  The patient was taken to the operating room and after proper identification and consent ascertain, she was placed on the operating table in the supine position.  After the induction of general endotracheal anesthesia, she was placed in the [place] stirrups and the abdomen, perineum, and vagina were prepped and draped in the routine sterile fashion. A transurethral Foley was placed.  A midline vertical incision was made from above the symphysis to below the umbilicus.  This incision was carried down to the level of the anterior rectus fascia.  The fascia was incised and the incision was extended superiorly and inferiorly.  The parietal peritoneum was elevated and incised after separation of the rectus muscles.  This incision was extended superiorly and inferiorly to the level of the bladder.  Palpation of the abdomen revealed smooth peritoneal surfaces with no evidence of tumor nodularity.  The diaphragm was smooth.  There were no obvious gallstones.  The appendix was surgically absent.  Both contours appeared normal.  Attention was then turned to the pelvis.  The patient is status post hysterectomy in the past.  Both tubes and ovaries were visualized and noted to be freely mobile.  The left ovary was 5-6 cm and distorted by a cystic appearing mass with a slightly smaller cystic appearing mass on the right ovary.  Both of these were smooth without external excrescences or adhesions and were felt to be benign first clinical impression.  Attention was turned to the left adnexa.  The round ligament was transected with the unipolar cautery with dissection carried  up along the pelvic sidewall with opening of the sidewall.  There was noted to be a significant amount of vascularity with large veins feeding into the adnexal mass on this side.  The ureter was noted and was reflected medially along the peritoneum in an effort to isolate the infundibulopelvic  ligament.  This was then clamped, cut, and doubly ligated with 2-0 Vicryl suture.  Sharp dissection was then continued to free up peritoneal attachments of the adnexal pedicle so that this could be removed. An Anderson clamp was then placed along the base of the pedicle with removal of the entire left fallopian tube and ovary.  This pedicle was then doubly ligated as well.  The specimen was sent for frozen sections.  Hemostasis was noted to be excellent.  The ureter was well out of the operative field and was peristalsing normally at the conclusion of the procedure.  An identical procedure was carried out on the right with removal of the right tube and ovary.  The round ligament was incised with dissection carried forward in the peritoneum along the pelvic sidewall.  Again, the ureter was isolated and was deflected inferiorly and medially out of the operative field.  The infundibulopelvic ligament was again isolated, clamped, cut, and doubly ligated.  Sharp dissection was then performed to free up peritoneal attachments of this adnexa and again a clamp was placed across the base of this with removal of the entire tube and ovary.  This pedicle was doubly ligated as well.  At this point, it was felt that both adnexa were benign. While awaiting frozen section, the peritoneal cavity was closed.  Of note, cytologic washings were taken prior to removal of the adnexa immediately upon opening of the abdomen.  These were discarded when the frozen section returned with benign findings.  Copious lavage was accomplished.  Hemostasis was excellent.  There were no additional significant findings.  The incision was closed in a running Smead-Jones fashion using PDS suture from top to middle and bottom in the middle with the sutures then tied at the middle.  The skin was closed with staples and a pressure dressing was applied.  After Dr. Guilford Shi  phoned in with a benign frozen section report, the patient was  then awakened, extubated, and transferred to the recovery room.  Estimated blood loss was 100 cc.  Urine output 100 cc and fluids 1700 cc crystalloid.  Counts correct x 2. Complications none. Attending Physician:  Jenean Lindau DD:  04/21/01 TD:  04/21/01 Job: [id number]

## 2010-12-14 NOTE — Op Note (Signed)
   NAME:  [name], COSS                          ACCOUNT NO.:  [id number]   MEDICAL RECORD NO.:  [id number]                   PATIENT TYPE:  AMB   LOCATION:  ENDO                                 FACILITY:  [facility]   PHYSICIAN:  John C. Madilyn Fireman, M.D.                 DATE OF BIRTH:  05-02-1925   DATE OF PROCEDURE:  02/21/2003  DATE OF DISCHARGE:                                 OPERATIVE REPORT   PROCEDURE:  Colonoscopy.   INDICATIONS FOR PROCEDURE:  Colon cancer screening in a 77 year old patient  with no previous colon screening.   DESCRIPTION OF PROCEDURE:  The patient was placed in the left lateral  decubitus position then placed on the pulse monitor with continuous low flow  oxygen delivered by nasal cannula. She was sedated with 25 mcg IV fentanyl  and 3 mg IV Versed in addition to the medicines given for the previous EGD.  The Olympus video colonoscope was inserted into the rectum and advanced to  the cecum, confirmed by transillumination at McBurney's point and  visualization of the ileocecal valve and appendiceal orifice. The prep was  excellent. The cecum, ascending, transverse, descending and sigmoid colon  all appeared normal with no masses, polyps, diverticula or other mucosal  abnormalities. The rectum likewise appeared normal and retroflexed view of  the anus revealed no obvious internal hemorrhoids. The colonoscope was then  withdrawn and the patient returned to the recovery room in stable condition.  She tolerated the procedure well and there were no immediate complications.   IMPRESSION:  Normal colonoscopy.   PLAN:  Next colon screening by sigmoidoscopy in five years.                                               John C. Madilyn Fireman, M.D.    JCH/MEDQ  D:  02/21/2003  T:  02/21/2003  Job:  [id number]   cc:   Laqueta Linden, M.D.  [address]  [place]  [place] [address]  Fax: [phone]

## 2010-12-14 NOTE — Discharge Summary (Signed)
[facility]  Patient:    [name], [name] Visit Number: [id number] MRN: [id number]          Service Type: GYN Location: 4W 0448 01 Attending Physician:  Jenean Lindau Dictated by:   Laqueta Linden, M.D. Admit Date:  04/21/2001 Discharge Date: 04/24/2001   CC:         [name], M.D.  John R. Aleen Campi, M.D.  Telford Nab, R.N.   Discharge Summary  PRINCIPAL DIAGNOSIS:  Bilateral ovarian serous cyst adenofibromas.  SECONDARY DIAGNOSES: 1. Status post total abdominal hysterectomy. 2. Hypertension. 3. Coronary artery disease with preoperative clearance per Dr. Aleen Campi.  PROCEDURES:  Exploratory laparotomy with bilateral salpingo-oophorectomy.  COMPLICATIONS:  None.  TRANSFUSIONS:  None.  CONSULTATIONS:  None.  HISTORY OF PRESENT ILLNESS:  [name] is a 77 year old menopausal female, status post total abdominal hysterectomy in the distant past who presented with bilateral adnexal masses, complex in nature.  She had a normal CA125. She was seen in consultation with Dr. De Blanch of the [facility], and it was determined appropriate to proceed to exploratory laparotomy with bilateral salpingo-oophorectomy and staging procedures as indicated.  The patient had been seen and counseled by both physicians prior to surgery.  She was cleared for surgery from a cardiology standpoint by Dr. Aleen Campi.  Please see dictated admission note for full details of the history of present illness, past history, family history, and social history, and examination on admission.  HOSPITAL COURSE:  The patient was admitted for same day surgery, having undergone a mechanical bowel prep at home.  She underwent a bilateral salpingo-oophorectomy under general endotracheal anesthesia without complications.  Intraoperative blood loss was 100 cc.  Frozen section at the time of surgery was that of benign serous cysts  bilaterally.  Postoperatively, she initially had some nausea which was improved with antiemetics.  She maintained good urinary output and stable vital signs.  On postoperative day #1, she was tolerating liquids with resolution of her nausea, had good bowel sounds, and a nondistended abdomen.  Her hemoglobin was 9.9, down from a preoperative value of 12.5, but this was felt to be somewhat dilutional.  Her potassium was slightly low at 3.0.  She was given potassium replacement through her IV, switched to oral medications, and ambulated. However, on postoperative day #2, she complained of not feeling well, having malodorous loose stools, and a lot of flatus and back and abdominal pain.  She gave a new history of irritable bowel syndrome exacerbated by dairy products which she had been consuming since they were presented to her on her tray. She was told to stop her dairy products, and had prompt decrease in the stools and the gas and the discomfort.  She had an isolated temperature elevation to 101.8 on the morning of postoperative day #2, and subsequently remained afebrile.  Her lungs were clear.  Her incision was healing well, and she had no focal explanation other than atelectasis.  Her followup potassium was normal at 3.4.  Her abdomen remained soft and nondistended with normal bowel sounds for the duration.  On postoperative day #2, she was aggressively ambulated and by postoperative day #3, was feeling much better, having passed flatus, was tolerating a general diet, reported pain had significantly diminished.  Her staples were removed, and Steri-Strips were placed.  Final pathology had returned revealing bilateral serous cyst adenofibromas of the ovaries measuring 6.5 cm on the left and 5.0 cm on the right with no other  pathologic diagnosis.  The patient was discharged home on postoperative day #3, in improved condition.  Verbal and written discharge instructions were reviewed with  the patient, as well as with her husband prior to discharge.  She was told to continue taking her routine medications, to use Advil or Aleve q.8h. for the next 5 to 7 days, to decrease her need for narcotics.  She was given a prescription for Percocet dispense 20 one or two q.4-6h. p.r.n. pain, and told to use stool softeners or milk of magnesia as needed.  FOLLOWUP:  She is to follow up in the office in two weeks for an incision check.  She is to follow up sooner for excessive pain, fever, bleeding, symptoms of deep venous thrombosis, pulmonary embolism, urinary tract infection, incisional infection, or other concerns.  CONDITION ON DISCHARGE:  She was discharged home in improved condition on 04/24/01. Dictated by:   Laqueta Linden, M.D. Attending Physician:  Jenean Lindau DD:  05/15/01 TD:  05/17/01 Job: [id number]

## 2010-12-26 ENCOUNTER — Ambulatory Visit (INDEPENDENT_AMBULATORY_CARE_PROVIDER_SITE_OTHER): Payer: Medicare Other | Admitting: *Deleted

## 2010-12-26 DIAGNOSIS — I4891 Unspecified atrial fibrillation: Secondary | ICD-10-CM

## 2011-01-23 ENCOUNTER — Ambulatory Visit (INDEPENDENT_AMBULATORY_CARE_PROVIDER_SITE_OTHER): Payer: Medicare Other | Admitting: *Deleted

## 2011-01-23 DIAGNOSIS — I4891 Unspecified atrial fibrillation: Secondary | ICD-10-CM

## 2011-02-01 ENCOUNTER — Other Ambulatory Visit: Payer: Self-pay | Admitting: Internal Medicine

## 2011-02-19 ENCOUNTER — Ambulatory Visit (INDEPENDENT_AMBULATORY_CARE_PROVIDER_SITE_OTHER): Payer: Medicare Other | Admitting: *Deleted

## 2011-02-19 DIAGNOSIS — I4891 Unspecified atrial fibrillation: Secondary | ICD-10-CM

## 2011-02-27 MED ORDER — DOFETILIDE 125 MCG PO CAPS: 125.0000 ug | ORAL_CAPSULE | Freq: Two times a day (BID) | ORAL | Status: DC

## 2011-03-05 ENCOUNTER — Telehealth: Payer: Self-pay | Admitting: Internal Medicine

## 2011-03-05 NOTE — Telephone Encounter (Signed)
I spoke with [name] at [facility]. PA form reviewed with her over the phone and PA given for 1 year.

## 2011-03-05 NOTE — Telephone Encounter (Signed)
Phar needs prior authorization for Tikosyn.  They have faxed this 3 times with no response.  They are unable to hold this open for much longer. Please call them as soon as possible with this authorization.

## 2011-03-15 ENCOUNTER — Other Ambulatory Visit: Payer: Self-pay | Admitting: Internal Medicine

## 2011-03-19 ENCOUNTER — Ambulatory Visit (INDEPENDENT_AMBULATORY_CARE_PROVIDER_SITE_OTHER): Payer: Medicare Other | Admitting: *Deleted

## 2011-03-19 DIAGNOSIS — I4891 Unspecified atrial fibrillation: Secondary | ICD-10-CM

## 2011-03-19 LAB — POCT INR: INR: 1.8

## 2011-04-09 ENCOUNTER — Ambulatory Visit (INDEPENDENT_AMBULATORY_CARE_PROVIDER_SITE_OTHER): Payer: Medicare Other | Admitting: *Deleted

## 2011-04-09 DIAGNOSIS — I4891 Unspecified atrial fibrillation: Secondary | ICD-10-CM

## 2011-04-22 ENCOUNTER — Encounter: Payer: Self-pay | Admitting: Internal Medicine

## 2011-04-23 ENCOUNTER — Ambulatory Visit (INDEPENDENT_AMBULATORY_CARE_PROVIDER_SITE_OTHER): Payer: Medicare Other | Admitting: Internal Medicine

## 2011-04-23 ENCOUNTER — Encounter: Payer: Self-pay | Admitting: Internal Medicine

## 2011-04-23 DIAGNOSIS — I10 Essential (primary) hypertension: Secondary | ICD-10-CM

## 2011-04-23 DIAGNOSIS — E785 Hyperlipidemia, unspecified: Secondary | ICD-10-CM

## 2011-04-23 DIAGNOSIS — I4891 Unspecified atrial fibrillation: Secondary | ICD-10-CM

## 2011-04-23 NOTE — Patient Instructions (Signed)
Your physician wants you to follow-up in: 6 months  You will receive a reminder letter in the mail two months in advance. If you don't receive a letter, please call our office to schedule the follow-up appointment.  Your physician recommends that you continue on your current medications as directed. Please refer to the Current Medication list given to you today.  

## 2011-04-23 NOTE — Assessment & Plan Note (Signed)
Blood work was notable for an elevated BUN/creatinine ratio. She "feels dehydrated". With potassium level being 5.1, we will discontinue her Spiriva lactone. Will have to keep track of her potassium level given the fact that she is on Tikosyn. This will be rechecked in 2 weeks time.

## 2011-04-23 NOTE — Progress Notes (Signed)
  HPI  [name] is a 77 y.o. female  Seen followup of atrial fibrillation for which she takes Tikosyn.  At her last visit she had concerns about chest pain which I thought were related to stress. She had a negative Myoview 2-3 years ago and a negative catheterization 10 years ago. Things are more stable at home  She was recently started on statin terapy again.  We had storpped them previously    Past Medical History  Diagnosis Date  . Atrial fibrillation     failure to tolerate Pradaxa secondary to GI symptoms  . First degree AV block   . SVT (supraventricular tachycardia)     possible atrial tachycardia, possible junctional tacycardia  . Hypertension   . Hypothyroidism   . Hyperlipidemia   . Gastroesophageal reflux disease   . Seasonal allergies   . H/O: hysterectomy     Past Surgical History  Procedure Date  . Laparoscopic bilateral salpingo-oophorectomy   . Arthroscopic knee surgery right     Current Outpatient Prescriptions  Medication Sig Dispense Refill  . ALPRAZolam (XANAX) 0.25 MG tablet Take 0.25 mg by mouth at bedtime as needed.        . diltiazem (CARDIZEM CD) 120 MG 24 hr capsule TAKE ONE CAPSULE BY MOUTH EVERY DAY  90 capsule  3  . dofetilide (TIKOSYN) 125 MCG capsule Take 1 capsule (125 mcg total) by mouth 2 (two) times daily.  60 capsule  8  . fexofenadine (ALLEGRA) 180 MG tablet Take 180 mg by mouth daily.        [name] levothyroxine (SYNTHROID, LEVOTHROID) 50 MCG tablet Take 50 mcg by mouth daily.        . meloxicam (MOBIC) 15 MG tablet Take 15 mg by mouth daily.        . metoprolol (TOPROL-XL) 50 MG 24 hr tablet Take 50 mg by mouth daily.        . Multiple Vitamins-Minerals (ICAPS) CAPS Take by mouth.        . multivitamin (THERAGRAN) per tablet Take 1 tablet by mouth daily.        . Omega-3 Fatty Acids (FISH OIL) 1000 MG CAPS Take by mouth.        . pantoprazole (PROTONIX) 20 MG tablet Take 1 tablet (20 mg total) by mouth daily.  30 tablet  11  .  pravastatin (PRAVACHOL) 80 MG tablet Take 1 tablet by mouth Daily.      [name] spironolactone (ALDACTONE) 25 MG tablet Take 1/2 tablet by mouth once daily      . warfarin (COUMADIN) 2.5 MG tablet Take as directed by Anticoagulation clinic  35 tablet  3    Allergies  Allergen Reactions  . Dabigatran   . Sulfonamide Derivatives     REACTION: hives    Review of Systems negative except from HPI and PMH  Physical Exam Well developed and well nourished in no acute distress HENT normal E scleral and icterus clear Neck Supple JVP flat; carotids brisk and full Clear to ausculation Regular rate and rhythm, no murmurs gallops or rub Soft with active bowel sounds No clubbing cyanosis and edema Alert and oriented, grossly normal motor and sensory function Skin Warm and Dry  Assesment

## 2011-04-23 NOTE — Assessment & Plan Note (Signed)
Discussed the resumption of her statin therapy. Would look to the Framingham risk score for women. Her score was about 18 which gave her a 4% 10 year risk. I am not sure that I would undertake statin therapy for that risk. I will do for that ultimately to the patient and Dr. Pain. She will review this with him.

## 2011-04-23 NOTE — Assessment & Plan Note (Signed)
Holding sinus rhythm on Tikosyn. QT Interval was normal. Potassium level from Dr. Wayna Chalet office last week was 5.1. Magnesium was not checked. They'll be checked next week.

## 2011-04-24 ENCOUNTER — Telehealth: Payer: Self-pay | Admitting: Internal Medicine

## 2011-04-24 NOTE — Telephone Encounter (Signed)
tikosyn wants refill

## 2011-04-25 MED ORDER — DOFETILIDE 125 MCG PO CAPS: 125.0000 ug | ORAL_CAPSULE | Freq: Two times a day (BID) | ORAL | Status: DC

## 2011-05-07 ENCOUNTER — Ambulatory Visit (INDEPENDENT_AMBULATORY_CARE_PROVIDER_SITE_OTHER): Payer: Medicare Other | Admitting: *Deleted

## 2011-05-07 DIAGNOSIS — I4891 Unspecified atrial fibrillation: Secondary | ICD-10-CM

## 2011-05-07 LAB — POCT INR: INR: 2.2

## 2011-05-10 ENCOUNTER — Encounter: Payer: Self-pay | Admitting: Internal Medicine

## 2011-06-04 ENCOUNTER — Ambulatory Visit (INDEPENDENT_AMBULATORY_CARE_PROVIDER_SITE_OTHER): Payer: Medicare Other | Admitting: *Deleted

## 2011-06-04 DIAGNOSIS — I4891 Unspecified atrial fibrillation: Secondary | ICD-10-CM

## 2011-06-04 LAB — POCT INR: INR: 1.9

## 2011-06-18 ENCOUNTER — Ambulatory Visit (INDEPENDENT_AMBULATORY_CARE_PROVIDER_SITE_OTHER): Payer: Medicare Other | Admitting: *Deleted

## 2011-06-18 DIAGNOSIS — I4891 Unspecified atrial fibrillation: Secondary | ICD-10-CM

## 2011-06-18 LAB — POCT INR: INR: 2.1

## 2011-07-15 ENCOUNTER — Ambulatory Visit (INDEPENDENT_AMBULATORY_CARE_PROVIDER_SITE_OTHER): Payer: Medicare Other | Admitting: *Deleted

## 2011-07-15 DIAGNOSIS — I4891 Unspecified atrial fibrillation: Secondary | ICD-10-CM

## 2011-07-25 ENCOUNTER — Other Ambulatory Visit: Payer: Self-pay | Admitting: Internal Medicine

## 2011-08-12 ENCOUNTER — Encounter: Payer: Medicare Other | Admitting: *Deleted

## 2011-08-13 ENCOUNTER — Ambulatory Visit (INDEPENDENT_AMBULATORY_CARE_PROVIDER_SITE_OTHER): Payer: Medicare Other | Admitting: *Deleted

## 2011-08-13 DIAGNOSIS — I4891 Unspecified atrial fibrillation: Secondary | ICD-10-CM

## 2011-08-13 LAB — POCT INR: INR: 2.3

## 2011-08-15 DIAGNOSIS — E7800 Pure hypercholesterolemia, unspecified: Secondary | ICD-10-CM | POA: Diagnosis not present

## 2011-09-18 ENCOUNTER — Other Ambulatory Visit: Payer: Self-pay | Admitting: Internal Medicine

## 2011-09-23 DIAGNOSIS — K5901 Slow transit constipation: Secondary | ICD-10-CM | POA: Diagnosis not present

## 2011-09-25 ENCOUNTER — Ambulatory Visit (INDEPENDENT_AMBULATORY_CARE_PROVIDER_SITE_OTHER): Payer: Medicare Other | Admitting: Pharmacist

## 2011-09-25 DIAGNOSIS — I4891 Unspecified atrial fibrillation: Secondary | ICD-10-CM

## 2011-09-25 LAB — POCT INR: INR: 1.5

## 2011-09-26 DIAGNOSIS — H1130 Conjunctival hemorrhage, unspecified eye: Secondary | ICD-10-CM | POA: Diagnosis not present

## 2011-09-26 DIAGNOSIS — H5710 Ocular pain, unspecified eye: Secondary | ICD-10-CM | POA: Diagnosis not present

## 2011-09-30 ENCOUNTER — Telehealth: Payer: Self-pay | Admitting: Internal Medicine

## 2011-09-30 DIAGNOSIS — I10 Essential (primary) hypertension: Secondary | ICD-10-CM

## 2011-09-30 NOTE — Telephone Encounter (Signed)
Patient states had palpitations last night it lasted more then  5 hours. HR 100 to 125 beats/ minute, her B/P last night  was 220/107. Today her pulse is about 61 beats per minute she does not think she is out of rhythm, B/P is 180/89.  Patient is taken Tikosyn 125 MCG twice a day and Metoprolol 50 mg once a day.

## 2011-09-30 NOTE — Telephone Encounter (Signed)
Pt had palpitations starting last night for about 5 1/2 hours, BP high as well, feels better today but afraid to do too much, pls advise

## 2011-09-30 NOTE — Telephone Encounter (Signed)
Dr. Patty Sermons DOD recommends for pt to take a Toprol XL 25 mg (1/2 tablet) this afternoon for today's B/p 180/89. Patient aware, she verbalized understanding.

## 2011-10-01 NOTE — Telephone Encounter (Signed)
Would have her increase her toprol to 100 mg daily  And let us know in the next week or two how her BP is doing tjhx

## 2011-10-01 NOTE — Telephone Encounter (Signed)
Forwarding to Dr. Klein. 

## 2011-10-01 NOTE — Telephone Encounter (Signed)
Pt took 1/2 tab more of toprol  just yesterday so far per dr Patty Sermons, bp is coming down but still at  151/103 reading as of 1015am this am, pt wants to know if needs to take anymore med? pls advise pt [phone]

## 2011-10-02 NOTE — Telephone Encounter (Signed)
Pt called and relayed message from [facility], pt will increase med, call us in a week and let us know how she's doing

## 2011-10-15 ENCOUNTER — Ambulatory Visit (INDEPENDENT_AMBULATORY_CARE_PROVIDER_SITE_OTHER): Payer: Medicare Other

## 2011-10-15 DIAGNOSIS — I4891 Unspecified atrial fibrillation: Secondary | ICD-10-CM

## 2011-10-15 LAB — POCT INR: INR: 2.5

## 2011-10-21 ENCOUNTER — Encounter: Payer: Self-pay | Admitting: Internal Medicine

## 2011-10-21 ENCOUNTER — Ambulatory Visit (INDEPENDENT_AMBULATORY_CARE_PROVIDER_SITE_OTHER): Payer: Medicare Other | Admitting: Internal Medicine

## 2011-10-21 VITALS — BP 129/77 | HR 63 | Ht 62.0 in | Wt 132.0 lb

## 2011-10-21 DIAGNOSIS — I10 Essential (primary) hypertension: Secondary | ICD-10-CM | POA: Diagnosis not present

## 2011-10-21 DIAGNOSIS — I4891 Unspecified atrial fibrillation: Secondary | ICD-10-CM | POA: Diagnosis not present

## 2011-10-21 NOTE — Assessment & Plan Note (Signed)
She has had an intercurrent episode of atrial fibrillation. I have told her that she could expect anotherr Hopefully however, she will not need long-term up titration of her beta blocker. I suggested that he has a recurrent event that she relax and take an extra Toprol at that time.  Dofetilide blood work has been done at [facility] in October and at that time the potassium and magnesium were normal. 6 months later, it is appropriate to readdress this

## 2011-10-21 NOTE — Patient Instructions (Signed)
Your physician wants you to follow-up in: 6 months with Dr. Graciela Husbands. You will receive a reminder letter in the mail two months in advance. If you don't receive a letter, please call our office to schedule the follow-up appointment.  Your physician has recommended you make the following change in your medication:  1) Decrease metoprolol to 50 mg once daily.

## 2011-10-21 NOTE — Progress Notes (Signed)
  HPI  [name] is a 78 y.o. female  Seen followup of atrial fibrillation for which she takes Tikosyn.  At her last visit she had concerns about chest pain which I thought were related to stress. She had a negative Myoview 2-3 years ago and a negative catheterization 10 years ago. Things are more stable at home  She had an episode of atrial fibrillation earlier this month. It was the first one in a year and a half. It lasted 5 hours. She took an extra metoprolol and then her dose was uptitrated.   Past Medical History  Diagnosis Date  . Atrial fibrillation     failure to tolerate Pradaxa secondary to GI symptoms  . First degree AV block   . SVT (supraventricular tachycardia)     possible atrial tachycardia, possible junctional tacycardia  . Hypertension   . Hypothyroidism   . Hyperlipidemia   . Gastroesophageal reflux disease   . Seasonal allergies   . H/O: hysterectomy     Past Surgical History  Procedure Date  . Laparoscopic bilateral salpingo-oophorectomy   . Arthroscopic knee surgery right     Current Outpatient Prescriptions  Medication Sig Dispense Refill  . ALPRAZolam (XANAX) 0.25 MG tablet Take 0.25 mg by mouth at bedtime as needed.        . diltiazem (CARDIZEM CD) 120 MG 24 hr capsule TAKE ONE CAPSULE BY MOUTH EVERY DAY  90 capsule  3  . dofetilide (TIKOSYN) 125 MCG capsule Take 1 capsule (125 mcg total) by mouth 2 (two) times daily.  60 capsule  8  . fexofenadine (ALLEGRA) 180 MG tablet Take 180 mg by mouth daily.        [name] levothyroxine (SYNTHROID, LEVOTHROID) 50 MCG tablet Take 50 mcg by mouth daily.        . meloxicam (MOBIC) 15 MG tablet Take 15 mg by mouth daily.        . metoprolol succinate (TOPROL-XL) 50 MG 24 hr tablet Take two tablets by mouth daily      . Multiple Vitamins-Minerals (ICAPS) CAPS Take by mouth.        . multivitamin (THERAGRAN) per tablet Take 1 tablet by mouth daily.        . Omega-3 Fatty Acids (FISH OIL) 1000 MG CAPS Take by  mouth.        [name] omeprazole (PRILOSEC OTC) 20 MG tablet Take 20 mg by mouth daily.      [name] warfarin (COUMADIN) 2.5 MG tablet TAKE AS DIRECTED BY ANTICOAGULATION CLINIC  35 tablet  3    Allergies  Allergen Reactions  . Dabigatran   . Sulfonamide Derivatives     REACTION: hives    Review of Systems negative except from HPI and PMH  Physical Exam BP 129/77  Pulse 63  Ht 5\' 2"  (1.575 m)  Wt 132 lb (59.875 kg)  BMI 24.14 kg/m2 Well developed and well nourished in no acute distress HENT normal E scleral and icterus clear Neck Supple JVP flat; carotids brisk and full Clear to ausculation Regular rate and rhythm, no murmurs gallops or rub Soft with active bowel sounds No clubbing cyanosis none Edema Alert and oriented, grossly normal motor and sensory function Skin Warm and Dry  Sinus at 63  Intervals 0.21/0.07/43 Axis -24   Assessment and  Plan

## 2011-10-21 NOTE — Assessment & Plan Note (Signed)
Blood pressure well controlled

## 2011-10-24 ENCOUNTER — Telehealth: Payer: Self-pay | Admitting: *Deleted

## 2011-10-24 NOTE — Telephone Encounter (Signed)
Per Dr. Lynne Logan office. No magnesium/ K+ levels since 04/2011. I called and explained to the patient that we would need to repeat her bmp/ magnesium levels. She states she is supposed to go on 4/12 for lab work with Dr. Ricki Miller. I explained I will call next week and find out where I can fax a lab order too. The patient is agreeable.

## 2011-10-24 NOTE — Telephone Encounter (Signed)
I spoke with the patient to have her come in for a repeat bmp/magnesium level for Tikosyn per Dr. Graciela Husbands. The patient tells me that she had labs done at Dr. Lynne Logan office within the last 2-3 months. The last thing we have on file for her is from 04/2011 for a magnesium level. I explained I would contact Dr. Lynne Logan office to see what she had done last and when. I will call her back after I hear back from Dr. Lynne Logan office. She is agreeable. Sherri Rad, RN, BSN   I left a message for the medical records staff at Dr. Lynne Logan office to call me and let me know what lab work she had done last. I will await a call back from them and then contact the patient. Sherri Rad, RN, BSN

## 2011-10-29 NOTE — Telephone Encounter (Signed)
Lab order faxed to Dr. Lynne Logan nurse, Shawna Orleans, at [phone]. Confirmation received.

## 2011-11-08 DIAGNOSIS — R5383 Other fatigue: Secondary | ICD-10-CM | POA: Diagnosis not present

## 2011-11-08 DIAGNOSIS — R5381 Other malaise: Secondary | ICD-10-CM | POA: Diagnosis not present

## 2011-11-12 ENCOUNTER — Ambulatory Visit (INDEPENDENT_AMBULATORY_CARE_PROVIDER_SITE_OTHER): Payer: Medicare Other

## 2011-11-12 DIAGNOSIS — I4891 Unspecified atrial fibrillation: Secondary | ICD-10-CM | POA: Diagnosis not present

## 2011-11-13 ENCOUNTER — Encounter: Payer: Self-pay | Admitting: Internal Medicine

## 2011-11-13 DIAGNOSIS — R5383 Other fatigue: Secondary | ICD-10-CM | POA: Diagnosis not present

## 2011-11-13 DIAGNOSIS — I10 Essential (primary) hypertension: Secondary | ICD-10-CM | POA: Diagnosis not present

## 2011-11-13 DIAGNOSIS — E7800 Pure hypercholesterolemia, unspecified: Secondary | ICD-10-CM | POA: Diagnosis not present

## 2011-11-13 DIAGNOSIS — R5381 Other malaise: Secondary | ICD-10-CM | POA: Diagnosis not present

## 2011-12-10 ENCOUNTER — Ambulatory Visit (INDEPENDENT_AMBULATORY_CARE_PROVIDER_SITE_OTHER): Payer: Medicare Other | Admitting: *Deleted

## 2011-12-10 DIAGNOSIS — I4891 Unspecified atrial fibrillation: Secondary | ICD-10-CM | POA: Diagnosis not present

## 2011-12-12 ENCOUNTER — Other Ambulatory Visit: Payer: Self-pay | Admitting: Internal Medicine

## 2011-12-19 ENCOUNTER — Telehealth: Payer: Self-pay | Admitting: Internal Medicine

## 2011-12-19 ENCOUNTER — Telehealth: Payer: Self-pay

## 2011-12-19 DIAGNOSIS — I4891 Unspecified atrial fibrillation: Secondary | ICD-10-CM

## 2011-12-19 MED ORDER — DOFETILIDE 125 MCG PO CAPS: 125.0000 ug | ORAL_CAPSULE | Freq: Two times a day (BID) | ORAL | Status: DC

## 2011-12-19 NOTE — Telephone Encounter (Signed)
Per Rose, this is for a Tikosyn refill to be sent to [facility].

## 2011-12-19 NOTE — Telephone Encounter (Signed)
Pt need refill at [facility] [employer] [phone]

## 2012-01-01 DIAGNOSIS — R42 Dizziness and giddiness: Secondary | ICD-10-CM | POA: Diagnosis not present

## 2012-01-01 DIAGNOSIS — R300 Dysuria: Secondary | ICD-10-CM | POA: Diagnosis not present

## 2012-01-03 ENCOUNTER — Ambulatory Visit
Admission: RE | Admit: 2012-01-03 | Discharge: 2012-01-03 | Disposition: A | Discharge status: Home / Self Care | Payer: Medicare Other | Source: Ambulatory Visit | Attending: Internal Medicine | Admitting: Internal Medicine

## 2012-01-03 ENCOUNTER — Other Ambulatory Visit: Payer: Self-pay | Admitting: Internal Medicine

## 2012-01-03 DIAGNOSIS — R51 Headache: Secondary | ICD-10-CM | POA: Diagnosis not present

## 2012-01-03 DIAGNOSIS — R42 Dizziness and giddiness: Secondary | ICD-10-CM

## 2012-01-03 DIAGNOSIS — G319 Degenerative disease of nervous system, unspecified: Secondary | ICD-10-CM | POA: Diagnosis not present

## 2012-01-20 DIAGNOSIS — D0460 Carcinoma in situ of skin of unspecified upper limb, including shoulder: Secondary | ICD-10-CM | POA: Diagnosis not present

## 2012-01-22 ENCOUNTER — Ambulatory Visit (INDEPENDENT_AMBULATORY_CARE_PROVIDER_SITE_OTHER): Payer: Medicare Other | Admitting: *Deleted

## 2012-01-22 DIAGNOSIS — I4891 Unspecified atrial fibrillation: Secondary | ICD-10-CM

## 2012-01-22 LAB — POCT INR: INR: 2.2

## 2012-02-03 ENCOUNTER — Other Ambulatory Visit: Payer: Self-pay | Admitting: Internal Medicine

## 2012-02-10 ENCOUNTER — Encounter: Payer: Self-pay | Admitting: Internal Medicine

## 2012-02-10 ENCOUNTER — Ambulatory Visit (INDEPENDENT_AMBULATORY_CARE_PROVIDER_SITE_OTHER): Payer: Medicare Other | Admitting: Internal Medicine

## 2012-02-10 VITALS — BP 118/74 | HR 66 | Ht 62.0 in | Wt 130.0 lb

## 2012-02-10 DIAGNOSIS — R079 Chest pain, unspecified: Secondary | ICD-10-CM | POA: Diagnosis not present

## 2012-02-10 DIAGNOSIS — R0602 Shortness of breath: Secondary | ICD-10-CM

## 2012-02-10 DIAGNOSIS — I4891 Unspecified atrial fibrillation: Secondary | ICD-10-CM | POA: Diagnosis not present

## 2012-02-10 DIAGNOSIS — R5381 Other malaise: Secondary | ICD-10-CM | POA: Diagnosis not present

## 2012-02-10 DIAGNOSIS — R5383 Other fatigue: Secondary | ICD-10-CM | POA: Insufficient documentation

## 2012-02-10 NOTE — Patient Instructions (Signed)
Your physician has recommended you make the following change in your medication:  1) hold diltiazem (cardizem) for 4 weeks to see how you feel- please call and let us know how you are doing at that time. 2) please check on the cost of xarelto and eliquis and let us know what would be affordable for you.  Your physician recommends that you have lab work today: bmp/bnp  Your physician wants you to follow-up in: 6 months with Dr. Graciela Husbands. You will receive a reminder letter in the mail two months in advance. If you don't receive a letter, please call our office to schedule the follow-up appointment.

## 2012-02-10 NOTE — Progress Notes (Signed)
HPI  [name] is a 78 y.o. female Seen followup of atrial fibrillation for which she takes Tikosyn.  At her last visit she had concerns about chest pain which I thought were related to stress. She had a negative Myoview 2-3 years ago and a negative catheterization 10 years ago.  Things are more stable at home   She had an episode of atrial fibrillation earlier this month. It was the first one in a year and a half. It lasted 5 hours. She took an extra metoprolol and then her dose was uptitrated.  She has vague chest pressure, lasting hours a day.  Also a difficulty with taking a deep breath     Past Medical History  Diagnosis Date  . Atrial fibrillation     failure to tolerate Pradaxa secondary to GI symptoms  . First degree AV block   . SVT (supraventricular tachycardia)     possible atrial tachycardia, possible junctional tacycardia  . Hypertension   . Hypothyroidism   . Hyperlipidemia   . Gastroesophageal reflux disease   . Seasonal allergies   . H/O: hysterectomy     Past Surgical History  Procedure Date  . Laparoscopic bilateral salpingo-oophorectomy   . Arthroscopic knee surgery right     Current Outpatient Prescriptions  Medication Sig Dispense Refill  . ALPRAZolam (XANAX) 0.25 MG tablet Take 0.25 mg by mouth at bedtime as needed.        . diltiazem (CARDIZEM CD) 120 MG 24 hr capsule TAKE ONE CAPSULE BY MOUTH EVERY DAY  90 capsule  0  . dofetilide (TIKOSYN) 125 MCG capsule Take 1 capsule (125 mcg total) by mouth 2 (two) times daily.  180 capsule  3  . fexofenadine (ALLEGRA) 180 MG tablet Take 180 mg by mouth daily.        [name] levothyroxine (SYNTHROID, LEVOTHROID) 50 MCG tablet Take 50 mcg by mouth daily.        . metoprolol succinate (TOPROL-XL) 50 MG 24 hr tablet Take 1 tablet (50 mg total) by mouth daily. Take with or immediately following a meal.      . Multiple Vitamins-Minerals (ICAPS) CAPS Take by mouth.        . multivitamin (THERAGRAN) per tablet Take 1  tablet by mouth daily.        [name] omeprazole (PRILOSEC OTC) 20 MG tablet Take 20 mg by mouth daily. As needed      . warfarin (COUMADIN) 2.5 MG tablet TAKE AS DIRECTED BY ANTICOAGULATION CLINIC  35 tablet  3  . DISCONTD: pantoprazole (PROTONIX) 20 MG tablet Take 1 tablet (20 mg total) by mouth daily.  30 tablet  11  . DISCONTD: pravastatin (PRAVACHOL) 80 MG tablet Take 40 mg by mouth Daily.       [name] DISCONTD: spironolactone (ALDACTONE) 25 MG tablet Take 1/2 tablet by mouth once daily        Allergies  Allergen Reactions  . Dabigatran   . Sulfonamide Derivatives     REACTION: hives    Review of Systems negative except from HPI and PMH  Physical Exam BP 118/74  Pulse 66  Ht 5\' 2"  (1.575 m)  Wt 130 lb (58.968 kg)  BMI 23.78 kg/m2 Well developed and well nourished in no acute distress HENT normal E scleral and icterus clear Neck Supple JVP flat; carotids brisk and full Clear to ausculation Regular rate and rhythm, no murmurs gallops or rub Soft with active bowel sounds No clubbing cyanosis none Edema Alert and  oriented, grossly normal motor and sensory function Skin Warm and Dry  ECG NSR  21/07/43 prwp no significnat change Assessment and  Plan

## 2012-02-10 NOTE — Assessment & Plan Note (Signed)
She has not had known coronary artery disease. Her symptoms are not strongly suggestive of ischemia with the prolonged duration. Her ECG does show poor R-wave progression but this is been seen previously. She is advised to let us know if his symptoms worsen.

## 2012-02-10 NOTE — Assessment & Plan Note (Signed)
I am not sure what this is. We've discussed stopping the diltiazem and seeing if there is any improvement.

## 2012-02-10 NOTE — Assessment & Plan Note (Addendum)
On tikosyn  Will check BMET;BNP   We also discussed alternative to Coumadin. She previously tried dabigitran which he did not tolerate; she will look into the cost of apixoban  rvaroxaban  she will look into the cost of these and see which she would like to try

## 2012-02-11 LAB — BASIC METABOLIC PANEL
Calcium: 9.3 mg/dL (ref 8.4–10.5)
GFR: 41.36 mL/min — ABNORMAL LOW (ref >60.00)
Potassium: 4.4 mEq/L (ref 3.5–5.1)
Sodium: 140 mEq/L (ref 135–145)

## 2012-02-11 LAB — BRAIN NATRIURETIC PEPTIDE: Pro B Natriuretic peptide (BNP): 140 pg/mL — ABNORMAL HIGH (ref 0.0–100.0)

## 2012-03-05 ENCOUNTER — Ambulatory Visit (INDEPENDENT_AMBULATORY_CARE_PROVIDER_SITE_OTHER): Payer: Medicare Other | Admitting: *Deleted

## 2012-03-05 DIAGNOSIS — I4891 Unspecified atrial fibrillation: Secondary | ICD-10-CM | POA: Diagnosis not present

## 2012-03-05 LAB — POCT INR: INR: 3.9

## 2012-03-26 ENCOUNTER — Ambulatory Visit (INDEPENDENT_AMBULATORY_CARE_PROVIDER_SITE_OTHER): Payer: Medicare Other | Admitting: *Deleted

## 2012-03-26 DIAGNOSIS — I4891 Unspecified atrial fibrillation: Secondary | ICD-10-CM

## 2012-03-26 LAB — POCT INR: INR: 3

## 2012-04-08 DIAGNOSIS — H6120 Impacted cerumen, unspecified ear: Secondary | ICD-10-CM | POA: Diagnosis not present

## 2012-04-08 DIAGNOSIS — M25539 Pain in unspecified wrist: Secondary | ICD-10-CM | POA: Diagnosis not present

## 2012-04-08 DIAGNOSIS — E7800 Pure hypercholesterolemia, unspecified: Secondary | ICD-10-CM | POA: Diagnosis not present

## 2012-04-08 DIAGNOSIS — I10 Essential (primary) hypertension: Secondary | ICD-10-CM | POA: Diagnosis not present

## 2012-04-17 ENCOUNTER — Telehealth: Payer: Self-pay | Admitting: Internal Medicine

## 2012-04-17 MED ORDER — DILTIAZEM HCL ER COATED BEADS 120 MG PO CP24: 120.0000 mg | ORAL_CAPSULE | Freq: Every day | ORAL | Status: DC

## 2012-04-17 NOTE — Telephone Encounter (Signed)
Pt to continue on current meds and not take extra metoprolol over the weekend.  She will call back if she has increased problems.

## 2012-04-17 NOTE — Telephone Encounter (Signed)
New Problem:    Patient had an episode of Afib last night and resolved it by taking an extra dose of metoprolol succinate (TOPROL-XL) 50 MG 24 hr tablet.  Patient is still not feeling well but is not currently in Afib.  Would like to know if she should continue taking two doses of her metoprolol succinate (TOPROL-XL) 50 MG 24 hr tablet for the rest of the weekend.  Please call back.

## 2012-04-17 NOTE — Telephone Encounter (Signed)
[name] is reporting an episode of atrial fib that started around 4:00pm and lasted for 7 hours or more.  She took an extra dose of Metoprolol which converted her.   She states she has been under a lot of stress (husband has dementia) and she missed a dose of Tikosyn yesterday am.  She states her bp was 144/122 this am and 128/112 a couple hours ago.  She states her pulse is running 80 this afternoon.  She states she still feels weak and tired and her chest is still hurting a little from the irregular rapid rate last night.  She is concerned about afib starting in again and wants to know if she should continue to take an extra dose of metoprolol this weekend, and if so, for how long?

## 2012-04-21 ENCOUNTER — Other Ambulatory Visit: Payer: Self-pay | Admitting: Internal Medicine

## 2012-04-22 NOTE — Telephone Encounter (Signed)
New problem:  C/O feels like she in Afib now. Can an  EKG be preformed.

## 2012-04-22 NOTE — Telephone Encounter (Signed)
I left a message for the patient to call. 

## 2012-04-23 ENCOUNTER — Ambulatory Visit (INDEPENDENT_AMBULATORY_CARE_PROVIDER_SITE_OTHER): Payer: Medicare Other | Admitting: *Deleted

## 2012-04-23 ENCOUNTER — Ambulatory Visit (INDEPENDENT_AMBULATORY_CARE_PROVIDER_SITE_OTHER): Payer: Medicare Other | Admitting: Physician Assistant

## 2012-04-23 ENCOUNTER — Encounter: Payer: Self-pay | Admitting: Physician Assistant

## 2012-04-23 VITALS — BP 150/76 | HR 91 | Ht 62.0 in | Wt 130.1 lb

## 2012-04-23 DIAGNOSIS — I10 Essential (primary) hypertension: Secondary | ICD-10-CM | POA: Diagnosis not present

## 2012-04-23 DIAGNOSIS — I4891 Unspecified atrial fibrillation: Secondary | ICD-10-CM | POA: Diagnosis not present

## 2012-04-23 DIAGNOSIS — I4892 Unspecified atrial flutter: Secondary | ICD-10-CM

## 2012-04-23 DIAGNOSIS — Z5181 Encounter for therapeutic drug level monitoring: Secondary | ICD-10-CM

## 2012-04-23 DIAGNOSIS — R079 Chest pain, unspecified: Secondary | ICD-10-CM

## 2012-04-23 LAB — BASIC METABOLIC PANEL
BUN: 25 mg/dL — ABNORMAL HIGH (ref 6–23)
Calcium: 9.1 mg/dL (ref 8.4–10.5)
Chloride: 99 mEq/L (ref 96–112)
Creatinine, Ser: 1.2 mg/dL (ref 0.4–1.2)
GFR: 45.34 mL/min — ABNORMAL LOW (ref >60.00)

## 2012-04-23 LAB — POCT INR: INR: 5

## 2012-04-23 LAB — MAGNESIUM: Magnesium: 2.1 mg/dL (ref 1.5–2.5)

## 2012-04-23 NOTE — Telephone Encounter (Signed)
F/u   Patient called backed concerned about elevated BP, she can be reached at [phone] this morning. I also made pt aware of HM attempt to reached her yesterday morning.

## 2012-04-23 NOTE — Progress Notes (Signed)
[address] [place], [place]  [address] Phone: [phone] Fax:  [phone]  Date:  04/23/2012   Name:  [name]   DOB:  1925-06-24   MRN:  [id number]  PCP:  Kaitlyn Patch, MD  Primary Cardiologist/Primary Electrophysiologist:  Dr. Sherryl Manges    History of Present Illness: [name] is a 78 y.o. female who returns today for evaluation of AFib.   She has a history of atrial fibrillation for which she takes dofetilide, HTN, HL, GERD, hypothyroidism. She is on chronic Coumadin therapy. She has a reportedly normal cardiac catheterization around 2001 and a normal stress test performed at [facility] in 2010. She was last seen by Dr. Graciela Husbands in 7/13.  She was put on prednisone recently for wrist pain.  She has felt like she was back in AFib since.  She notes elevated HRs last week in the 120s.  She is now off prednisone.  She is feeling better.  Overall, still does "not feel right."  She gets chest pressure when her HR is fast.  No exertional CP.  She feels somewhat SOB.  Notes Class II symptoms.  No orthopnea, PND, edema.  No syncope or near syncope.     Lab Results  Component Value Date   INR 5.0 04/23/2012   INR 3.0 03/26/2012   INR 3.9 03/05/2012     Wt Readings from Last 3 Encounters:  02/10/12 130 lb (58.968 kg)  10/21/11 132 lb (59.875 kg)  04/23/11 135 lb (61.236 kg)     Past Medical History  Diagnosis Date  . Atrial fibrillation     failure to tolerate Pradaxa secondary to GI symptoms  . First degree AV block   . SVT (supraventricular tachycardia)     possible atrial tachycardia, possible junctional tacycardia  . Hypertension   . Hypothyroidism   . Hyperlipidemia   . Gastroesophageal reflux disease   . Seasonal allergies   . H/O: hysterectomy   . Hx of echocardiogram     echo 07/2009:  mild focal basal septal hypertrophy, EF 55-60%, mild MR, mod LAE, mild RAE, PASP 45    Current Outpatient Prescriptions  Medication Sig  Dispense Refill  . ALPRAZolam (XANAX) 0.25 MG tablet Take 0.25 mg by mouth at bedtime as needed.        . diltiazem (CARDIZEM CD) 120 MG 24 hr capsule Take 1 capsule (120 mg total) by mouth daily.  90 capsule  1  . dofetilide (TIKOSYN) 125 MCG capsule Take 1 capsule (125 mcg total) by mouth 2 (two) times daily.  180 capsule  3  . fexofenadine (ALLEGRA) 180 MG tablet Take 180 mg by mouth daily.        [name] levothyroxine (SYNTHROID, LEVOTHROID) 50 MCG tablet Take 50 mcg by mouth daily.        . metoprolol succinate (TOPROL-XL) 50 MG 24 hr tablet Take 1 tablet (50 mg total) by mouth daily. Take with or immediately following a meal.      . Multiple Vitamins-Minerals (ICAPS) CAPS Take by mouth.        . multivitamin (THERAGRAN) per tablet Take 1 tablet by mouth daily.        [name] omeprazole (PRILOSEC OTC) 20 MG tablet Take 20 mg by mouth daily. As needed      . warfarin (COUMADIN) 2.5 MG tablet TAKE AS DIRECTED BY ANTICOAGULATION CLINIC  35 tablet  3  . DISCONTD: pantoprazole (PROTONIX) 20 MG tablet Take 1 tablet (20  mg total) by mouth daily.  30 tablet  11  . DISCONTD: pravastatin (PRAVACHOL) 80 MG tablet Take 40 mg by mouth Daily.       [name] DISCONTD: spironolactone (ALDACTONE) 25 MG tablet Take 1/2 tablet by mouth once daily        Allergies: Allergies  Allergen Reactions  . Dabigatran   . Sulfonamide Derivatives     REACTION: hives    History  Substance Use Topics  . Smoking status: Never Smoker   . Smokeless tobacco: Not on file  . Alcohol Use: No     ROS:  Please see the history of present illness.     All other systems reviewed and negative.   PHYSICAL EXAM: VS:  BP 150/76  Pulse 91  Ht 5\' 2"  (1.575 m)  Wt 130 lb 1.9 oz (59.022 kg)  BMI 23.80 kg/m2  SpO2 98% Well nourished, well developed, in no acute distress HEENT: normal Neck: no JVD Endocrine:  No thyromegaly  Cardiac:  normal S1, S2; RRR; no murmur Lungs:  clear to auscultation bilaterally, no wheezing, rhonchi or  rales Abd: soft, nontender, no hepatomegaly Ext: no edema Skin: warm and dry Neuro:  CNs 2-12 intact, no focal abnormalities noted  EKG:  Probable AFlutter, HR 89, no significant ST changes      ASSESSMENT AND PLAN:  1. Atrial Fibrillation/Flutter:  The patient's ECG was reviewed with both Dr. Daleen Squibb and Dr. Ladona Ridgel today.  We had a hard time deciding if she was in NSR vs AV dissociation.  But, after review with Dr. Ladona Ridgel, he felt this was AFlutter.  Her atrial rate is slowed by the Tikosyn.  She has had a therapeutic INR for several mos.  We discussed proceeding with DCCV vs watchful waiting.  The patient's symptoms are improved.  Her rate is controlled.  She feels better the longer she is off prednisone.  She prefers to hold off on DCCV for now.  So, I will check a BMET and Mg2+ today. She will return to see Dr. Sherryl Manges in 2 weeks.  If she remains in AFlutter at that time, we may need to consider DCCV.  She knows to take an extra Toprol prn higher HRs.   2. Chest Pain:  This seems to be related to her arrhythmia.  Continue to monitor for now.    3. Hypertension:  Borderline elevated.  Continue to monitor for now.  Signed, Tereso Newcomer, PA-C  2:24 PM 04/23/2012

## 2012-04-23 NOTE — Telephone Encounter (Signed)
Appt made today with Tereso Newcomer.

## 2012-04-23 NOTE — Patient Instructions (Addendum)
Your physician recommends that you return for lab work in: TODAY BMET, MAGNESIUM LEVEL   Your physician recommends that you schedule a follow-up appointment in: 2 WEEKS WITH DR. Graciela Husbands PER DR. Graciela Husbands

## 2012-04-24 ENCOUNTER — Telehealth: Payer: Self-pay | Admitting: *Deleted

## 2012-04-24 NOTE — Telephone Encounter (Signed)
pt notified about lab results w/verbal understanding today 

## 2012-04-24 NOTE — Telephone Encounter (Signed)
Message copied by Tarri Fuller on Fri Apr 24, 2012 10:48 AM ------      Message from: [place], [place] T      Created: Thu Apr 23, 2012  5:39 PM       Please notify patient that the lab results are ok.      Tereso Newcomer, PA-C  5:39 PM 04/23/2012

## 2012-04-30 ENCOUNTER — Ambulatory Visit: Payer: Medicare Other | Admitting: Physician Assistant

## 2012-05-01 ENCOUNTER — Ambulatory Visit (INDEPENDENT_AMBULATORY_CARE_PROVIDER_SITE_OTHER): Payer: Medicare Other | Admitting: Pharmacist

## 2012-05-01 DIAGNOSIS — I4891 Unspecified atrial fibrillation: Secondary | ICD-10-CM

## 2012-05-01 LAB — POCT INR: INR: 2.5

## 2012-05-06 ENCOUNTER — Ambulatory Visit (INDEPENDENT_AMBULATORY_CARE_PROVIDER_SITE_OTHER): Payer: Medicare Other | Admitting: Internal Medicine

## 2012-05-06 ENCOUNTER — Encounter: Payer: Self-pay | Admitting: Internal Medicine

## 2012-05-06 VITALS — BP 150/70 | HR 77 | Ht 62.0 in | Wt 128.8 lb

## 2012-05-06 DIAGNOSIS — I10 Essential (primary) hypertension: Secondary | ICD-10-CM

## 2012-05-06 DIAGNOSIS — I4891 Unspecified atrial fibrillation: Secondary | ICD-10-CM

## 2012-05-06 MED ORDER — METOPROLOL SUCCINATE ER 100 MG PO TB24: 100.0000 mg | ORAL_TABLET | Freq: Every day | ORAL | Status: DC

## 2012-05-06 MED ORDER — APIXABAN 2.5 MG PO TABS: 2.5000 mg | ORAL_TABLET | Freq: Two times a day (BID) | ORAL | Status: DC

## 2012-05-06 NOTE — Progress Notes (Signed)
Patient Care Team: Juline Patch, MD as PCP - General   HPI  [name] is a 78 y.o. female  Seen followup of atrial fibrillation for which she takes Tikosyn.  At her last visit she had concerns about chest pain which I thought were related to stress. She had a negative Myoview 2-3 years ago and a negative catheterization 10 years ago.  Things are more stable at home   She has had a couple of recent episodes of atrial fibrillation which are quite discombobulated. She also is exposed prednisone for wrist injury that disrupted her stable INR  Creatinine clearance checked in September was approximately 45 with a creatinine of 1.2;  potassium and magnesium levels were normal  She also would like to discuss alternative to Coumadin  Past Medical History  Diagnosis Date  . Atrial fibrillation     failure to tolerate Pradaxa secondary to GI symptoms  . First degree AV block   . SVT (supraventricular tachycardia)     possible atrial tachycardia, possible junctional tacycardia  . Hypertension   . Hypothyroidism   . Hyperlipidemia   . Gastroesophageal reflux disease   . Seasonal allergies   . H/O: hysterectomy   . Hx of echocardiogram     echo 07/2009:  mild focal basal septal hypertrophy, EF 55-60%, mild MR, mod LAE, mild RAE, PASP 45    Past Surgical History  Procedure Date  . Laparoscopic bilateral salpingo-oophorectomy   . Arthroscopic knee surgery right     Current Outpatient Prescriptions  Medication Sig Dispense Refill  . Acetaminophen (TYLENOL 8 HOUR PO) Take by mouth as needed.      . diltiazem (CARDIZEM CD) 120 MG 24 hr capsule Take 1 capsule (120 mg total) by mouth daily.  90 capsule  1  . dofetilide (TIKOSYN) 125 MCG capsule Take 1 capsule (125 mcg total) by mouth 2 (two) times daily.  180 capsule  3  . fexofenadine (ALLEGRA) 180 MG tablet Take 180 mg by mouth daily.        [name] levothyroxine (SYNTHROID, LEVOTHROID) 50 MCG tablet Take 50 mcg by mouth daily.        .  Meloxicam (MOBIC PO) Take by mouth as needed.      . metoprolol succinate (TOPROL-XL) 50 MG 24 hr tablet Take 1 tablet (50 mg total) by mouth daily. Take with or immediately following a meal.      . Multiple Vitamins-Minerals (ICAPS) CAPS Take by mouth.        . multivitamin (THERAGRAN) per tablet Take 1 tablet by mouth daily.        [name] omeprazole (PRILOSEC OTC) 20 MG tablet Take 20 mg by mouth daily. As needed      . warfarin (COUMADIN) 2.5 MG tablet TAKE AS DIRECTED BY ANTICOAGULATION CLINIC  35 tablet  3  . DISCONTD: pantoprazole (PROTONIX) 20 MG tablet Take 1 tablet (20 mg total) by mouth daily.  30 tablet  11  . DISCONTD: pravastatin (PRAVACHOL) 80 MG tablet Take 40 mg by mouth Daily.       [name] DISCONTD: spironolactone (ALDACTONE) 25 MG tablet Take 1/2 tablet by mouth once daily        Allergies  Allergen Reactions  . Dabigatran   . Sulfonamide Derivatives     REACTION: hives    Review of Systems negative except from HPI and PMH  Physical Exam BP 150/70  Pulse 77  Ht 5\' 2"  (1.575 m)  Wt 128 lb 12.8 oz (58.423  kg)  BMI 23.56 kg/m2 Well developed and well nourished in no acute distress HENT normal E scleral and icterus clear Neck Supple JVP flat; carotids brisk and full Clear to ausculation Regular rate and rhythm, no murmurs gallops or rub Soft with active bowel sounds No clubbing cyanosis none Edema Alert and oriented, grossly normal motor and sensory function Skin Warm and Dry  Electrocardiogram today demonstrates sinus rhythm at 78 beats per minute intervals 22/08/31  Assessment and  Plan

## 2012-05-06 NOTE — Assessment & Plan Note (Signed)
The patient continues to have episodes of atrial fibrillation. These are quite disruptive. They seem to be quieted by the use of metoprolol. Hence we will reprogram her dosing and have her take 50-100 mg in the morning, the increased dose being because of elevated blood pressure.  She also would like to undertake a NOAC instead of Coumadin. She previously took dabigitran. We have discussed   apixaban. We will dose it at 2.5 twice daily; risks and benefits compared to Coumadin

## 2012-05-06 NOTE — Assessment & Plan Note (Signed)
As above.

## 2012-05-06 NOTE — Patient Instructions (Addendum)
Change metoprolol to 100mg  daily with breakfast.  STOP Coumadin.  Start Eliquis 2.5mg  twice daily beginning on Sunday evening.  Your physician recommends that you schedule a follow-up appointment in: 4 months with Dr. Graciela Husbands.

## 2012-05-07 ENCOUNTER — Ambulatory Visit: Payer: Medicare Other | Admitting: Internal Medicine

## 2012-05-11 DIAGNOSIS — R5383 Other fatigue: Secondary | ICD-10-CM | POA: Diagnosis not present

## 2012-05-11 DIAGNOSIS — I10 Essential (primary) hypertension: Secondary | ICD-10-CM | POA: Diagnosis not present

## 2012-05-11 DIAGNOSIS — M25539 Pain in unspecified wrist: Secondary | ICD-10-CM | POA: Diagnosis not present

## 2012-05-11 DIAGNOSIS — R5381 Other malaise: Secondary | ICD-10-CM | POA: Diagnosis not present

## 2012-05-11 DIAGNOSIS — E7800 Pure hypercholesterolemia, unspecified: Secondary | ICD-10-CM | POA: Diagnosis not present

## 2012-05-13 DIAGNOSIS — M899 Disorder of bone, unspecified: Secondary | ICD-10-CM | POA: Diagnosis not present

## 2012-05-13 DIAGNOSIS — R5383 Other fatigue: Secondary | ICD-10-CM | POA: Diagnosis not present

## 2012-05-13 DIAGNOSIS — Z23 Encounter for immunization: Secondary | ICD-10-CM | POA: Diagnosis not present

## 2012-05-13 DIAGNOSIS — E7800 Pure hypercholesterolemia, unspecified: Secondary | ICD-10-CM | POA: Diagnosis not present

## 2012-05-13 DIAGNOSIS — I10 Essential (primary) hypertension: Secondary | ICD-10-CM | POA: Diagnosis not present

## 2012-05-13 DIAGNOSIS — R5381 Other malaise: Secondary | ICD-10-CM | POA: Diagnosis not present

## 2012-05-15 ENCOUNTER — Telehealth: Payer: Self-pay | Admitting: Internal Medicine

## 2012-05-15 NOTE — Telephone Encounter (Signed)
New Problem:    Patient called in confused about the dose of her apixaban (ELIQUIS) 2.5 MG TABS tablet.  Please call back.

## 2012-05-15 NOTE — Telephone Encounter (Signed)
Pt. is concerned about taking Eliquis 2.5 mg bid b/c she was only taking Warfarin 2.5 mg once daily. Pt. is advised that b/c Warfarin and Eliquis are different medications they are prescribed differently. She verbalized understanding.

## 2012-06-02 ENCOUNTER — Telehealth: Payer: Self-pay | Admitting: Internal Medicine

## 2012-06-02 NOTE — Telephone Encounter (Signed)
Pt wants to get off eliquis and go back to coumadin

## 2012-06-02 NOTE — Telephone Encounter (Signed)
Will review with Dr. Graciela Husbands and get back to pt.

## 2012-06-03 DIAGNOSIS — H43399 Other vitreous opacities, unspecified eye: Secondary | ICD-10-CM | POA: Diagnosis not present

## 2012-06-03 DIAGNOSIS — H04129 Dry eye syndrome of unspecified lacrimal gland: Secondary | ICD-10-CM | POA: Diagnosis not present

## 2012-06-03 DIAGNOSIS — H40019 Open angle with borderline findings, low risk, unspecified eye: Secondary | ICD-10-CM | POA: Diagnosis not present

## 2012-06-08 ENCOUNTER — Encounter: Payer: Self-pay | Admitting: Internal Medicine

## 2012-06-08 NOTE — Telephone Encounter (Signed)
Pt still has not heard anything and she only has two pills of eliquis left

## 2012-06-08 NOTE — Telephone Encounter (Signed)
erro

## 2012-06-09 DIAGNOSIS — I10 Essential (primary) hypertension: Secondary | ICD-10-CM | POA: Diagnosis not present

## 2012-06-09 DIAGNOSIS — M810 Age-related osteoporosis without current pathological fracture: Secondary | ICD-10-CM | POA: Diagnosis not present

## 2012-06-09 DIAGNOSIS — E785 Hyperlipidemia, unspecified: Secondary | ICD-10-CM | POA: Diagnosis not present

## 2012-06-10 NOTE — Telephone Encounter (Signed)
F/u   plz return call to pt [phone] who is following up on discontinuing Eloquis Med, pt notes she has called several times to date.

## 2012-06-10 NOTE — Telephone Encounter (Signed)
Per Weston Brass Rph, Pt is to stop Eliquis and restart coumadin 2.5mg  daily.

## 2012-06-16 ENCOUNTER — Encounter: Payer: Self-pay | Admitting: *Deleted

## 2012-06-19 ENCOUNTER — Ambulatory Visit (INDEPENDENT_AMBULATORY_CARE_PROVIDER_SITE_OTHER): Payer: Medicare Other | Admitting: *Deleted

## 2012-06-19 DIAGNOSIS — I4891 Unspecified atrial fibrillation: Secondary | ICD-10-CM | POA: Diagnosis not present

## 2012-06-19 LAB — POCT INR: INR: 2.1

## 2012-07-06 NOTE — Telephone Encounter (Signed)
New problem:   Need a statement saying Dr. Graciela Husbands took patient off her medication  Equilis . In order for her to receive a refund of $ 40.00 .  Patient asking the statement to be ready on Wednesday due to another appt in the office.

## 2012-07-06 NOTE — Telephone Encounter (Signed)
**Note De-Identified [name] Obfuscation** According to pn from 11/5 per pt. request Weston Brass advised pt. to stop Eliquis and to resume Coumadin. Pt. has [facility] appt. on 12/11 and wants a note at that time. Will forward to [facility].

## 2012-07-08 ENCOUNTER — Ambulatory Visit (INDEPENDENT_AMBULATORY_CARE_PROVIDER_SITE_OTHER): Payer: Medicare Other | Admitting: *Deleted

## 2012-07-08 DIAGNOSIS — I4891 Unspecified atrial fibrillation: Secondary | ICD-10-CM | POA: Diagnosis not present

## 2012-08-03 DIAGNOSIS — M5380 Other specified dorsopathies, site unspecified: Secondary | ICD-10-CM | POA: Diagnosis not present

## 2012-08-03 DIAGNOSIS — R300 Dysuria: Secondary | ICD-10-CM | POA: Diagnosis not present

## 2012-08-12 ENCOUNTER — Ambulatory Visit (INDEPENDENT_AMBULATORY_CARE_PROVIDER_SITE_OTHER): Payer: Medicare Other | Admitting: *Deleted

## 2012-08-12 DIAGNOSIS — I4891 Unspecified atrial fibrillation: Secondary | ICD-10-CM | POA: Diagnosis not present

## 2012-08-12 LAB — POCT INR: INR: 2.9

## 2012-09-16 ENCOUNTER — Ambulatory Visit (INDEPENDENT_AMBULATORY_CARE_PROVIDER_SITE_OTHER): Payer: Medicare Other | Admitting: *Deleted

## 2012-09-16 ENCOUNTER — Ambulatory Visit (INDEPENDENT_AMBULATORY_CARE_PROVIDER_SITE_OTHER): Payer: Medicare Other | Admitting: Internal Medicine

## 2012-09-16 DIAGNOSIS — I4891 Unspecified atrial fibrillation: Secondary | ICD-10-CM

## 2012-09-16 DIAGNOSIS — R5381 Other malaise: Secondary | ICD-10-CM | POA: Diagnosis not present

## 2012-09-16 DIAGNOSIS — R5383 Other fatigue: Secondary | ICD-10-CM

## 2012-09-16 LAB — BASIC METABOLIC PANEL
BUN: 16 mg/dL (ref 6–23)
CO2: 27 mEq/L (ref 19–32)
Chloride: 103 mEq/L (ref 96–112)
Glucose, Bld: 90 mg/dL (ref 70–99)
Potassium: 4 mEq/L (ref 3.5–5.1)

## 2012-09-16 LAB — MAGNESIUM: Magnesium: 2.1 mg/dL (ref 1.5–2.5)

## 2012-09-16 MED ORDER — APIXABAN 2.5 MG PO TABS: 2.5000 mg | ORAL_TABLET | Freq: Two times a day (BID) | ORAL | Status: DC

## 2012-09-16 NOTE — Progress Notes (Signed)
Patient Care Team: Juline Patch, MD as PCP - General   HPI  [name] is a 79 y.o. female  seen in followup for atrial fibrillation for which he takes Tikosyn. She also has hypertension which has been poorly controlled. At our last visit we switched her from Coumadin to apixaban. She has gone back on Coumadin but she would like to go back on apixaban now. She is under a great deal of stress as her husband has been hospitalized with pneumonia and his dementia. He is to come home on Friday and the patient already exhausted  Past Medical History  Diagnosis Date  . Atrial fibrillation     failure to tolerate Pradaxa secondary to GI symptoms  . First degree AV block   . SVT (supraventricular tachycardia)     possible atrial tachycardia, possible junctional tacycardia  . Hypertension   . Hypothyroidism   . Hyperlipidemia   . Gastroesophageal reflux disease   . Seasonal allergies   . H/O: hysterectomy   . Hx of echocardiogram     echo 07/2009:  mild focal basal septal hypertrophy, EF 55-60%, mild MR, mod LAE, mild RAE, PASP 45    Past Surgical History  Procedure Laterality Date  . Laparoscopic bilateral salpingo-oophorectomy    . Arthroscopic knee surgery right      Current Outpatient Prescriptions  Medication Sig Dispense Refill  . Acetaminophen (TYLENOL 8 HOUR PO) Take by mouth as needed.      [name] alendronate (FOSAMAX) 70 MG tablet Take 70 mg by mouth every 7 (seven) days. Take with a full glass of water on an empty stomach.      . diltiazem (CARDIZEM CD) 120 MG 24 hr capsule Take 1 capsule (120 mg total) by mouth daily.  90 capsule  1  . dofetilide (TIKOSYN) 125 MCG capsule Take 1 capsule (125 mcg total) by mouth 2 (two) times daily.  180 capsule  3  . fexofenadine (ALLEGRA) 180 MG tablet Take 180 mg by mouth daily.        [name] levothyroxine (SYNTHROID, LEVOTHROID) 50 MCG tablet Take 50 mcg by mouth daily.        . Meloxicam (MOBIC PO) Take by mouth as needed.      . metoprolol  succinate (TOPROL-XL) 100 MG 24 hr tablet Take 1 tablet (100 mg total) by mouth daily with breakfast. Take with or immediately following a meal.  90 tablet  3  . Multiple Vitamins-Minerals (ICAPS) CAPS Take by mouth.        . multivitamin (THERAGRAN) per tablet Take 1 tablet by mouth daily.        [name] omeprazole (PRILOSEC OTC) 20 MG tablet Take 20 mg by mouth daily. As needed      . warfarin (COUMADIN) 2.5 MG tablet Take 2.5 mg by mouth as directed. Stopped eliquis      . [DISCONTINUED] pantoprazole (PROTONIX) 20 MG tablet Take 1 tablet (20 mg total) by mouth daily.  30 tablet  11  . [DISCONTINUED] pravastatin (PRAVACHOL) 80 MG tablet Take 40 mg by mouth Daily.       . [DISCONTINUED] spironolactone (ALDACTONE) 25 MG tablet Take 1/2 tablet by mouth once daily       No current facility-administered medications for this visit.    Allergies  Allergen Reactions  . Dabigatran   . Sulfonamide Derivatives     REACTION: hives    Review of Systems negative except from HPI and PMH  PE There were no vitals  taken for this visit. Well developed and well nourished in no acute distress HENT normal E scleral and icterus clear Neck Supple JVP flat; carotids brisk and full Clear to ausculation  Regular rate and rhythm, no murmurs gallops or rub Soft with active bowel sounds No clubbing cyanosis none Edema Alert and oriented, grossly normal motor and sensory function Skin Warm and Dry    Assessment and  Plan

## 2012-09-16 NOTE — Patient Instructions (Signed)
Your physician has recommended you make the following change in your medication:  1) Stop warfarin today. 2) Start Eliquis 2.5 mg one tablet by mouth twice daily on Monday 2/24.  Your physician wants you to follow-up in: 6 months with Dr. Graciela Husbands. You will receive a reminder letter in the mail two months in advance. If you don't receive a letter, please call our office to schedule the follow-up appointment.  Your physician recommends that you have lab work today: bmp/magesium

## 2012-09-21 NOTE — Assessment & Plan Note (Signed)
Very stressful psychosocial situation

## 2012-09-21 NOTE — Assessment & Plan Note (Signed)
contnue current meds,  She would like to start apixoban

## 2012-10-01 DIAGNOSIS — N390 Urinary tract infection, site not specified: Secondary | ICD-10-CM | POA: Diagnosis not present

## 2012-10-01 DIAGNOSIS — R300 Dysuria: Secondary | ICD-10-CM | POA: Diagnosis not present

## 2012-10-01 DIAGNOSIS — R5381 Other malaise: Secondary | ICD-10-CM | POA: Diagnosis not present

## 2012-10-01 DIAGNOSIS — R6883 Chills (without fever): Secondary | ICD-10-CM | POA: Diagnosis not present

## 2012-10-01 DIAGNOSIS — R5383 Other fatigue: Secondary | ICD-10-CM | POA: Diagnosis not present

## 2012-10-08 ENCOUNTER — Other Ambulatory Visit: Payer: Self-pay | Admitting: Internal Medicine

## 2012-10-09 ENCOUNTER — Other Ambulatory Visit: Payer: Self-pay | Admitting: Internal Medicine

## 2012-10-16 ENCOUNTER — Other Ambulatory Visit: Payer: Self-pay | Admitting: Internal Medicine

## 2012-10-24 ENCOUNTER — Other Ambulatory Visit: Payer: Self-pay | Admitting: Internal Medicine

## 2012-10-26 ENCOUNTER — Ambulatory Visit: Payer: Self-pay | Admitting: Pharmacist

## 2012-10-26 DIAGNOSIS — I4891 Unspecified atrial fibrillation: Secondary | ICD-10-CM

## 2012-11-03 ENCOUNTER — Telehealth: Payer: Self-pay | Admitting: Internal Medicine

## 2012-11-03 DIAGNOSIS — R300 Dysuria: Secondary | ICD-10-CM | POA: Diagnosis not present

## 2012-11-03 DIAGNOSIS — Z0000 Encounter for general adult medical examination without abnormal findings: Secondary | ICD-10-CM | POA: Diagnosis not present

## 2012-11-03 DIAGNOSIS — I10 Essential (primary) hypertension: Secondary | ICD-10-CM | POA: Diagnosis not present

## 2012-11-03 NOTE — Telephone Encounter (Signed)
Spoke with pt, she reports being in and out of atrial fib since Friday night. She feels tired and weak in the legs. She had been taking metoprolol 100 mg but recently had cut back to the 50 mg once daily. This am she did take the metoprolol 100 mg. Her pulse is 79 to 85 at present, she is unable to get a bp. She will cont to monitor, she was given the okay to take an extra 1/2 of the metoprolol this afternoon if she cont to have atrial fib. She will make sure her pulse and bp are okay prior to taking. She is on the eliquis and confirmed she will cont to take as directed. She will call back if she cont to have problems. She is under a lot of stress taking care of her husband.

## 2012-11-03 NOTE — Telephone Encounter (Signed)
New Prob    Pt has atrial fib, been going on and off since 4/4. Concerned and would like to know what she can do to help. Would like to speak to nurse.

## 2012-11-12 ENCOUNTER — Telehealth: Payer: Self-pay | Admitting: Internal Medicine

## 2012-11-12 NOTE — Telephone Encounter (Signed)
Attempted to call the patient. Busy x 2 tries.

## 2012-11-12 NOTE — Telephone Encounter (Signed)
New problem   Pt is taking Eliquis and wants to know how can she come off the medication. Please call pt.

## 2012-11-13 MED ORDER — WARFARIN SODIUM 2.5 MG PO TABS: 2.5000 mg | ORAL_TABLET | Freq: Every day | ORAL | Status: DC

## 2012-11-13 NOTE — Telephone Encounter (Signed)
Spoke with pt, she would like to come off the eliquis and restart coumadin. She reports extreme fatigue and leg numbness from the eliquis and does not want to take it anymore. Will discuss with the [facility] clinic the procedure for changing aqnd call the pt back.

## 2012-11-13 NOTE — Telephone Encounter (Signed)
Discussed with dr Graciela Husbands, pt instructed to take eliquis and coumadin 2.5 mg together for three days, then the pt will stop the eliquis and cont with coumadin 2.5 mg daily. She will come to the office Friday 11-20-12 for [facility] appt. Pt voiced understanding.

## 2012-11-13 NOTE — Telephone Encounter (Signed)
New Prob    Pt would like to come off of ELIQUIS but is not sure of procedure to do so. Would like to speak to nurse.

## 2012-11-17 ENCOUNTER — Other Ambulatory Visit: Payer: Self-pay | Admitting: Internal Medicine

## 2012-11-20 ENCOUNTER — Ambulatory Visit (INDEPENDENT_AMBULATORY_CARE_PROVIDER_SITE_OTHER): Payer: Medicare Other

## 2012-11-20 DIAGNOSIS — I4891 Unspecified atrial fibrillation: Secondary | ICD-10-CM

## 2012-11-20 DIAGNOSIS — Z7901 Long term (current) use of anticoagulants: Secondary | ICD-10-CM

## 2012-11-20 LAB — POCT INR: INR: 2.2

## 2012-11-24 ENCOUNTER — Other Ambulatory Visit: Payer: Self-pay | Admitting: Internal Medicine

## 2012-11-24 ENCOUNTER — Other Ambulatory Visit: Payer: Self-pay | Admitting: *Deleted

## 2012-12-09 ENCOUNTER — Ambulatory Visit (INDEPENDENT_AMBULATORY_CARE_PROVIDER_SITE_OTHER): Payer: Medicare Other | Admitting: *Deleted

## 2012-12-09 DIAGNOSIS — F411 Generalized anxiety disorder: Secondary | ICD-10-CM | POA: Diagnosis not present

## 2012-12-09 DIAGNOSIS — L570 Actinic keratosis: Secondary | ICD-10-CM | POA: Diagnosis not present

## 2012-12-09 DIAGNOSIS — I4891 Unspecified atrial fibrillation: Secondary | ICD-10-CM | POA: Diagnosis not present

## 2012-12-09 DIAGNOSIS — R5381 Other malaise: Secondary | ICD-10-CM | POA: Diagnosis not present

## 2012-12-09 DIAGNOSIS — R5383 Other fatigue: Secondary | ICD-10-CM | POA: Diagnosis not present

## 2012-12-09 DIAGNOSIS — Z7901 Long term (current) use of anticoagulants: Secondary | ICD-10-CM | POA: Diagnosis not present

## 2012-12-30 DIAGNOSIS — H5710 Ocular pain, unspecified eye: Secondary | ICD-10-CM | POA: Diagnosis not present

## 2012-12-30 DIAGNOSIS — H40019 Open angle with borderline findings, low risk, unspecified eye: Secondary | ICD-10-CM | POA: Diagnosis not present

## 2012-12-30 DIAGNOSIS — H04129 Dry eye syndrome of unspecified lacrimal gland: Secondary | ICD-10-CM | POA: Diagnosis not present

## 2013-01-06 ENCOUNTER — Ambulatory Visit (INDEPENDENT_AMBULATORY_CARE_PROVIDER_SITE_OTHER): Payer: Medicare Other | Admitting: *Deleted

## 2013-01-06 DIAGNOSIS — Z7901 Long term (current) use of anticoagulants: Secondary | ICD-10-CM | POA: Diagnosis not present

## 2013-01-06 DIAGNOSIS — I4891 Unspecified atrial fibrillation: Secondary | ICD-10-CM | POA: Diagnosis not present

## 2013-01-20 ENCOUNTER — Other Ambulatory Visit: Payer: Self-pay | Admitting: Internal Medicine

## 2013-02-02 DIAGNOSIS — I10 Essential (primary) hypertension: Secondary | ICD-10-CM | POA: Diagnosis not present

## 2013-02-02 DIAGNOSIS — N390 Urinary tract infection, site not specified: Secondary | ICD-10-CM | POA: Diagnosis not present

## 2013-02-05 ENCOUNTER — Ambulatory Visit (INDEPENDENT_AMBULATORY_CARE_PROVIDER_SITE_OTHER): Payer: Medicare Other

## 2013-02-05 DIAGNOSIS — Z7901 Long term (current) use of anticoagulants: Secondary | ICD-10-CM | POA: Diagnosis not present

## 2013-02-05 DIAGNOSIS — I4891 Unspecified atrial fibrillation: Secondary | ICD-10-CM | POA: Diagnosis not present

## 2013-03-03 ENCOUNTER — Telehealth: Payer: Self-pay | Admitting: Physician Assistant

## 2013-03-04 NOTE — Telephone Encounter (Deleted)
Error

## 2013-03-05 ENCOUNTER — Ambulatory Visit (INDEPENDENT_AMBULATORY_CARE_PROVIDER_SITE_OTHER): Payer: Medicare Other

## 2013-03-05 DIAGNOSIS — I4891 Unspecified atrial fibrillation: Secondary | ICD-10-CM

## 2013-03-05 DIAGNOSIS — Z7901 Long term (current) use of anticoagulants: Secondary | ICD-10-CM

## 2013-03-05 LAB — POCT INR: INR: 2.8

## 2013-03-31 ENCOUNTER — Ambulatory Visit (INDEPENDENT_AMBULATORY_CARE_PROVIDER_SITE_OTHER): Payer: Medicare Other | Admitting: *Deleted

## 2013-03-31 DIAGNOSIS — I4891 Unspecified atrial fibrillation: Secondary | ICD-10-CM | POA: Diagnosis not present

## 2013-03-31 DIAGNOSIS — Z7901 Long term (current) use of anticoagulants: Secondary | ICD-10-CM | POA: Diagnosis not present

## 2013-04-06 ENCOUNTER — Ambulatory Visit (INDEPENDENT_AMBULATORY_CARE_PROVIDER_SITE_OTHER): Payer: Medicare Other | Admitting: Internal Medicine

## 2013-04-06 ENCOUNTER — Encounter: Payer: Self-pay | Admitting: Internal Medicine

## 2013-04-06 VITALS — BP 141/77 | HR 67 | Ht 62.0 in | Wt 122.0 lb

## 2013-04-06 DIAGNOSIS — I4891 Unspecified atrial fibrillation: Secondary | ICD-10-CM

## 2013-04-06 DIAGNOSIS — R51 Headache: Secondary | ICD-10-CM

## 2013-04-06 DIAGNOSIS — W19XXXA Unspecified fall, initial encounter: Secondary | ICD-10-CM | POA: Diagnosis not present

## 2013-04-06 DIAGNOSIS — Z79899 Other long term (current) drug therapy: Secondary | ICD-10-CM

## 2013-04-06 NOTE — Progress Notes (Signed)
Patient Care Team: Juline Patch, MD as PCP - General   HPI  [name] is a 79 y.o. female  seen in followup for atrial fibrillation for which he takes Tikosyn. She also has hypertension which has been poorly controlled. At our last visit we switched her from Coumadin to apixaban. She has gone back on Coumadin but she would like to go back on apixaban now.    She is doing really quite well. Her husband is home. His dementia worsens. He is on chronic oxygen. Somehow she is surviving with a big smile even.  She fell last week and hit her head. She was trying to chase his vitals with her shoes off.  Past Medical History  Diagnosis Date  . Atrial fibrillation     failure to tolerate Pradaxa secondary to GI symptoms  . First degree AV block   . SVT (supraventricular tachycardia)     possible atrial tachycardia, possible junctional tacycardia  . Hypertension   . Hypothyroidism   . Hyperlipidemia   . Gastroesophageal reflux disease   . Seasonal allergies   . H/O: hysterectomy   . Hx of echocardiogram     echo 07/2009:  mild focal basal septal hypertrophy, EF 55-60%, mild MR, mod LAE, mild RAE, PASP 45    Past Surgical History  Procedure Laterality Date  . Laparoscopic bilateral salpingo-oophorectomy    . Arthroscopic knee surgery right      Current Outpatient Prescriptions  Medication Sig Dispense Refill  . Acetaminophen (TYLENOL 8 HOUR PO) Take by mouth as needed.      . diltiazem (CARDIZEM CD) 120 MG 24 hr capsule TAKE 1 CAPSULE (120 MG TOTAL) BY MOUTH DAILY.  90 capsule  1  . fexofenadine (ALLEGRA) 180 MG tablet Take 180 mg by mouth daily.        [name] levothyroxine (SYNTHROID, LEVOTHROID) 50 MCG tablet Take 50 mcg by mouth daily.        . metoprolol succinate (TOPROL-XL) 100 MG 24 hr tablet Take 1 tablet (100 mg total) by mouth daily with breakfast. Take with or immediately following a meal.  90 tablet  3  . Multiple Vitamins-Minerals (VITEYES AREDS FORMULA/LUTEIN) CAPS Take by  mouth 2 (two) times daily.      . multivitamin (THERAGRAN) per tablet Take 1 tablet by mouth daily.        [name] omeprazole (PRILOSEC OTC) 20 MG tablet Take 20 mg by mouth daily. As needed      . TIKOSYN 125 MCG capsule TAKE 1 CAPSULE (125 MCG) BY MOUTH Two times daily AS DIRECTED. CONSULTMD BEFORE TAKING ANY NEW MEDICATIONS INCLUDING OTC/HERBAL PRODUCTS.  180 capsule  1  . warfarin (COUMADIN) 2.5 MG tablet TAKE AS DIRECTED BY ANTICOAGULATION CLINIC  35 tablet  3  . [DISCONTINUED] pantoprazole (PROTONIX) 20 MG tablet Take 1 tablet (20 mg total) by mouth daily.  30 tablet  11  . [DISCONTINUED] pravastatin (PRAVACHOL) 80 MG tablet Take 40 mg by mouth Daily.       . [DISCONTINUED] spironolactone (ALDACTONE) 25 MG tablet Take 1/2 tablet by mouth once daily       No current facility-administered medications for this visit.    Allergies  Allergen Reactions  . Dabigatran   . Sulfonamide Derivatives     REACTION: hives    Review of Systems negative except from HPI and PMH  PE BP 141/77  Pulse 67  Ht 5\' 2"  (1.575 m)  Wt 122 lb (55.339 kg)  BMI 22.31  kg/m2 Well developed and well nourished in no acute distress HENT normal E scleral and icterus clear Neck Supple JVP flat; carotids brisk and full Clear to ausculation  Regular rate and rhythm, no murmurs gallops or rub Soft with active bowel sounds No clubbing cyanosis none Edema Alert and oriented, grossly normal motor and sensory function Skin Warm and Dry  ECG sinus rhythm at 67 Intervals 22/08/43 with a QTC of 46  Assessment and  Plan   1. Atrial fibrillation: Continue dofetilide. We'll check surveillance laboratories. We have discussed again anticoagulation she  would like to stay on Coumadin and she feels better on it.  2. Fall. Last week she fell in her head. We'll undertake a CAT scan.

## 2013-04-06 NOTE — Patient Instructions (Addendum)
Your physician recommends that you return for lab work today: Magnesium, Potassium   Your physician recommends you have a head CT (to follow-up after you fell and hit your head last week)  Your physician wants you to follow-up in: one year with Dr. Graciela Husbands.  You will receive a reminder letter in the mail two months in advance. If you don't receive a letter, please call our office to schedule the follow-up appointment.

## 2013-04-07 LAB — MAGNESIUM: Magnesium: 2.1 mg/dL (ref 1.5–2.5)

## 2013-04-09 ENCOUNTER — Emergency Department (HOSPITAL_COMMUNITY): Payer: Medicare Other

## 2013-04-09 ENCOUNTER — Emergency Department (HOSPITAL_COMMUNITY)
Admission: EM | Admit: 2013-04-09 | Discharge: 2013-04-09 | Disposition: A | Discharge status: Home / Self Care | Payer: Medicare Other | Attending: Emergency Medicine | Admitting: Emergency Medicine

## 2013-04-09 ENCOUNTER — Encounter (HOSPITAL_COMMUNITY): Payer: Self-pay | Admitting: Emergency Medicine

## 2013-04-09 DIAGNOSIS — E039 Hypothyroidism, unspecified: Secondary | ICD-10-CM | POA: Insufficient documentation

## 2013-04-09 DIAGNOSIS — T07XXXA Unspecified multiple injuries, initial encounter: Secondary | ICD-10-CM | POA: Insufficient documentation

## 2013-04-09 DIAGNOSIS — Y92009 Unspecified place in unspecified non-institutional (private) residence as the place of occurrence of the external cause: Secondary | ICD-10-CM | POA: Insufficient documentation

## 2013-04-09 DIAGNOSIS — I4891 Unspecified atrial fibrillation: Secondary | ICD-10-CM | POA: Insufficient documentation

## 2013-04-09 DIAGNOSIS — IMO0002 Reserved for concepts with insufficient information to code with codable children: Secondary | ICD-10-CM | POA: Diagnosis not present

## 2013-04-09 DIAGNOSIS — I10 Essential (primary) hypertension: Secondary | ICD-10-CM | POA: Diagnosis not present

## 2013-04-09 DIAGNOSIS — Z79899 Other long term (current) drug therapy: Secondary | ICD-10-CM | POA: Insufficient documentation

## 2013-04-09 DIAGNOSIS — S0990XA Unspecified injury of head, initial encounter: Secondary | ICD-10-CM | POA: Diagnosis not present

## 2013-04-09 DIAGNOSIS — R040 Epistaxis: Secondary | ICD-10-CM | POA: Insufficient documentation

## 2013-04-09 DIAGNOSIS — K219 Gastro-esophageal reflux disease without esophagitis: Secondary | ICD-10-CM | POA: Diagnosis not present

## 2013-04-09 DIAGNOSIS — R51 Headache: Secondary | ICD-10-CM | POA: Diagnosis not present

## 2013-04-09 DIAGNOSIS — Z7901 Long term (current) use of anticoagulants: Secondary | ICD-10-CM | POA: Insufficient documentation

## 2013-04-09 DIAGNOSIS — Y93E9 Activity, other interior property and clothing maintenance: Secondary | ICD-10-CM | POA: Insufficient documentation

## 2013-04-09 DIAGNOSIS — W010XXA Fall on same level from slipping, tripping and stumbling without subsequent striking against object, initial encounter: Secondary | ICD-10-CM | POA: Insufficient documentation

## 2013-04-09 LAB — CBC WITH DIFFERENTIAL/PLATELET
Basophils Absolute: 0 10*3/uL (ref 0.0–0.1)
Eosinophils Relative: 1 % (ref 0–5)
Lymphocytes Relative: 9 % — ABNORMAL LOW (ref 12–46)
MCV: 79.9 fL (ref 78.0–100.0)
Neutro Abs: 11.1 10*3/uL — ABNORMAL HIGH (ref 1.7–7.7)
Platelets: 215 10*3/uL (ref 150–400)
RDW: 14.2 % (ref 11.5–15.5)
WBC: 13.4 10*3/uL — ABNORMAL HIGH (ref 4.0–10.5)

## 2013-04-09 LAB — URINE MICROSCOPIC-ADD ON

## 2013-04-09 LAB — POCT I-STAT TROPONIN I: Troponin i, poc: 0 ng/mL (ref 0.00–0.08)

## 2013-04-09 LAB — URINALYSIS, ROUTINE W REFLEX MICROSCOPIC
Bilirubin Urine: NEGATIVE
Hgb urine dipstick: NEGATIVE
Ketones, ur: NEGATIVE mg/dL
Nitrite: NEGATIVE
pH: 7 (ref 5.0–8.0)

## 2013-04-09 LAB — PRO B NATRIURETIC PEPTIDE: Pro B Natriuretic peptide (BNP): 669 pg/mL — ABNORMAL HIGH (ref 0–450)

## 2013-04-09 MED ORDER — HYDROCODONE-ACETAMINOPHEN 7.5-325 MG/15ML PO SOLN: 5.0000 mL | Freq: Four times a day (QID) | ORAL | Status: DC | PRN

## 2013-04-09 NOTE — ED Notes (Addendum)
Per pt, was cleaning lights on porch when she lost her balance and landed face first, front teeth pushed upward, nose swollen, abrasions to b/l elbow/arms-no LOC, on blood thinners

## 2013-04-09 NOTE — ED Provider Notes (Addendum)
CSN: [id number]     Arrival date & time 04/09/13  1817 History   First MD Initiated Contact with Patient 04/09/13 1837     Chief Complaint  Patient presents with  . Fall    HPI  Patient was on her front porch sustaining a single step leaning to the side cleaning her front porch light. Just her balance [employer]. She turned, and tried to get down the stairs quickly. She went down 2 steps and tripped over the third. She landed prone. Caught herself with her outstretched hands and wrists. No injury to her wrist. Struck her face against the ground. She has pain on her nose, and bleeding from her nose.  No loss of consciousness. No neck pain. No pain at the wrist. Abrasions on both elbows and the right volar palm and wrist.  History of paroxysmal A. fib. Is in atrial fibrillation upon arrival. Initial hypertensive, improves markedly untreated as she is in the emergency room. Past Medical History  Diagnosis Date  . Atrial fibrillation     failure to tolerate Pradaxa secondary to GI symptoms  . First degree AV block   . SVT (supraventricular tachycardia)     possible atrial tachycardia, possible junctional tacycardia  . Hypertension   . Hypothyroidism   . Hyperlipidemia   . Gastroesophageal reflux disease   . Seasonal allergies   . H/O: hysterectomy   . Hx of echocardiogram     echo 07/2009:  mild focal basal septal hypertrophy, EF 55-60%, mild MR, mod LAE, mild RAE, PASP 45   Past Surgical History  Procedure Laterality Date  . Laparoscopic bilateral salpingo-oophorectomy    . Arthroscopic knee surgery right     History reviewed. No pertinent family history. History  Substance Use Topics  . Smoking status: Never Smoker   . Smokeless tobacco: Not on file  . Alcohol Use: No   OB History   Grav Para Term Preterm Abortions TAB SAB Ect Mult Living                 Review of Systems  Constitutional: Negative for fatigue.  HENT: Positive for nosebleeds. Negative for neck pain.         Nasal pain and swelling  Eyes: Negative for pain and visual disturbance.  Respiratory: Negative for chest tightness.   Cardiovascular: Positive for chest pain.  Endocrine: Negative for polyuria.  Genitourinary: Negative for dysuria and frequency.  Musculoskeletal: Negative for back pain and joint swelling.  Skin: Positive for color change and wound.  Neurological: Positive for headaches. Negative for dizziness, seizures and syncope.  Hematological: Bruises/bleeds easily.    Allergies  Dabigatran and Sulfonamide derivatives  Home Medications   Current Outpatient Rx  Name  Route  Sig  Dispense  Refill  . diltiazem (CARDIZEM CD) 120 MG 24 hr capsule      TAKE 1 CAPSULE (120 MG TOTAL) BY MOUTH DAILY.   90 capsule   1     NEED NEW RX, IMAGE WAS SENT OVER AND DOES NOT MATC ...   . diltiazem (CARDIZEM SR) 120 MG 12 hr capsule   Oral   Take 120 mg by mouth at bedtime.         . fexofenadine (ALLEGRA) 180 MG tablet   Oral   Take 180 mg by mouth daily.           [name] levothyroxine (SYNTHROID, LEVOTHROID) 50 MCG tablet   Oral   Take 50 mcg by mouth daily.           [name]  metoprolol succinate (TOPROL-XL) 100 MG 24 hr tablet   Oral   Take 1 tablet (100 mg total) by mouth daily with breakfast. Take with or immediately following a meal.   90 tablet   3   . Multiple Vitamins-Minerals (VITEYES AREDS FORMULA/LUTEIN) CAPS   Oral   Take by mouth 2 (two) times daily.         . multivitamin (THERAGRAN) per tablet   Oral   Take 1 tablet by mouth daily.           [name] omeprazole (PRILOSEC OTC) 20 MG tablet   Oral   Take 20 mg by mouth daily. As needed         . TIKOSYN 125 MCG capsule      TAKE 1 CAPSULE (125 MCG) BY MOUTH Two times daily AS DIRECTED. CONSULTMD BEFORE TAKING ANY NEW MEDICATIONS INCLUDING OTC/HERBAL PRODUCTS.   180 capsule   1   . warfarin (COUMADIN) 2.5 MG tablet   Oral   Take 2.5 mg by mouth at bedtime.         [name] HYDROcodone-acetaminophen  (HYCET) 7.5-325 mg/15 ml solution   Oral   Take 5 mLs by mouth 4 (four) times daily as needed for pain.   120 mL   0    BP 185/99  Pulse 77  Temp(Src) 97.6 F (36.4 C) (Oral)  Resp 16  SpO2 98% Physical Exam  Constitutional:  Awake alert  HENT:  Swelling over the top of the naris and symmetrically towards the medial canthus of both eyes. No proptosis, no enophthalmos. Normal extraocular movements. Normal V1 through V3 sensation, intact and symmetric bilaterally. No malocclusion.  She has fractures of the ears off of her top, maxillary central incisors bilaterally.  No gingival laceration or obvious alveolar ridge fracture. Normal range of motion of the mandible.  No hemotympanum or blood over the mastoids. No septal hematoma  Neck:  Gen. the midline neck and spine  Cardiovascular:  A. fib on the monitor. Occasional episodes of sinus beats. No murmurs rubs gallops  Pulmonary/Chest:  Clear lungs without signs of failure  Abdominal:  Benign abdomen  Musculoskeletal:  Abrasions to bilateral elbows and right wrist full range of motion of these without pain  Neurological:  Awake alert moving all 4 extremity or neurological symptoms  Skin:  Abrasions to the elbows and upper extremities is about    ED Course  Procedures (including critical care time)  EKG: Indication irregular pulse history of A. Fib.  Interpretation atrial fibrillation with controlled ventricular response  Repeat EKG: Indication rhythm change or rotation sinus rhythm without acute or ischemic changes ventricular ectopy conversion from A. fib on earlier EKG Labs Review Labs Reviewed  CBC WITH DIFFERENTIAL - Abnormal; Notable for the following:    WBC 13.4 (*)    MCH 25.7 (*)    Neutrophils Relative % 83 (*)    Neutro Abs 11.1 (*)    Lymphocytes Relative 9 (*)    All other components within normal limits  PRO B NATRIURETIC PEPTIDE - Abnormal; Notable for the following:    Pro B Natriuretic peptide (BNP)  669.0 (*)    All other components within normal limits  PROTIME-INR - Abnormal; Notable for the following:    Prothrombin Time 24.4 (*)    INR 2.28 (*)    All other components within normal limits  URINALYSIS, ROUTINE W REFLEX MICROSCOPIC - Abnormal; Notable for the following:    Leukocytes, UA MODERATE (*)  All other components within normal limits  URINE MICROSCOPIC-ADD ON  MAGNESIUM  POCT I-STAT TROPONIN I  POCT I-STAT TROPONIN I   Imaging Review Ct Head Wo Contrast  04/09/2013   CLINICAL DATA:  Fall, nose swelling, abrasions, evaluate for injury  EXAM: CT HEAD WITHOUT CONTRAST  CT MAXILLOFACIAL WITHOUT CONTRAST  TECHNIQUE: Multidetector CT imaging of the head and maxillofacial structures were performed using the standard protocol without intravenous contrast. Multiplanar CT image reconstructions of the maxillofacial structures were also generated.  COMPARISON:  01/03/2012  FINDINGS: CT HEAD FINDINGS  No skull fracture is noted. No intracranial hemorrhage, mass effect or midline shift.  Stable cerebral atrophy. Again noted periventricular and patchy subcortical white matter decreased attenuation consistent with chronic small vessel ischemic changes. Paranasal sinuses and mastoid air cells are unremarkable.  CT MAXILLOFACIAL FINDINGS  Axial images shows no nasal bone fractures. No facial fractures are identified. There is perinasal soft tissue swelling. There are metallic dental artifact. Mild soft tissue swelling supra nasal region midline. Bilateral eye globe is unremarkable. No intraorbital hematoma.  Coronal images shows no orbital rim or orbital floor fracture. No mandibular fracture is noted. No TMJ dislocation.  Sagittal images shows degenerative changes cervical spine. No maxillary spine fracture is noted. The nasopharyngeal and oropharyngeal airway is patent. No prevertebral soft tissue swelling.  No zygomatic fracture is noted.  IMPRESSION: CT HEAD IMPRESSION  No acute intracranial  abnormality. Stable atrophy and chronic white matter disease.  CT MAXILLOFACIAL IMPRESSION  No facial fractures are noted. No fascial fluid collection. There is perinasal and supra nasal soft tissue swelling. No intraorbital hematoma. No mandibular fracture.   Electronically Signed   By: Natasha Mead   On: 04/09/2013 20:04   Ct Maxillofacial Wo Cm  04/09/2013   CLINICAL DATA:  Fall, nose swelling, abrasions, evaluate for injury  EXAM: CT HEAD WITHOUT CONTRAST  CT MAXILLOFACIAL WITHOUT CONTRAST  TECHNIQUE: Multidetector CT imaging of the head and maxillofacial structures were performed using the standard protocol without intravenous contrast. Multiplanar CT image reconstructions of the maxillofacial structures were also generated.  COMPARISON:  01/03/2012  FINDINGS: CT HEAD FINDINGS  No skull fracture is noted. No intracranial hemorrhage, mass effect or midline shift.  Stable cerebral atrophy. Again noted periventricular and patchy subcortical white matter decreased attenuation consistent with chronic small vessel ischemic changes. Paranasal sinuses and mastoid air cells are unremarkable.  CT MAXILLOFACIAL FINDINGS  Axial images shows no nasal bone fractures. No facial fractures are identified. There is perinasal soft tissue swelling. There are metallic dental artifact. Mild soft tissue swelling supra nasal region midline. Bilateral eye globe is unremarkable. No intraorbital hematoma.  Coronal images shows no orbital rim or orbital floor fracture. No mandibular fracture is noted. No TMJ dislocation.  Sagittal images shows degenerative changes cervical spine. No maxillary spine fracture is noted. The nasopharyngeal and oropharyngeal airway is patent. No prevertebral soft tissue swelling.  No zygomatic fracture is noted.  IMPRESSION: CT HEAD IMPRESSION  No acute intracranial abnormality. Stable atrophy and chronic white matter disease.  CT MAXILLOFACIAL IMPRESSION  No facial fractures are noted. No fascial fluid  collection. There is perinasal and supra nasal soft tissue swelling. No intraorbital hematoma. No mandibular fracture.   Electronically Signed   By: Natasha Mead   On: 04/09/2013 20:04    MDM   1. Multiple contusions   2. Atrial fibrillation    Her CT is normal of her facial bones and of her head. She has some soft tissue  swelling over the nasal bones but no obvious fracture. INR 2.28. She is in nature fibrillation last to continue her Coumadin. She's has excellent rate control of her A. fib on her metoprolol. I don't think her atrial fibrillation needs any additional treatment tonight.  Astra contact her cardiologist on Monday for reevaluation and further instruction regarding A. fib if it persists.  I reexamined her she does have some evolving ecchymosis over the naris and into the medial canthus of both eyes. The patient may likely have bilateral circumferential periorbital ecchymosis but morning. Her vision changes or becomes doubled her initial symptoms that should recheck here. Recheck with her dentist regarding the fracture of her veneers.   Claudean Kinds, MD 04/09/13 2228  Claudean Kinds, MD 04/09/13 [phone]

## 2013-04-10 LAB — MAGNESIUM: Magnesium: 2.1 mg/dL (ref 1.5–2.5)

## 2013-04-12 ENCOUNTER — Other Ambulatory Visit: Payer: Medicare Other

## 2013-04-12 ENCOUNTER — Telehealth: Payer: Self-pay | Admitting: *Deleted

## 2013-04-12 NOTE — Telephone Encounter (Signed)
Called stating she fell Friday night. Fell on her face and broke 2 teeth. Went to ER. Did full work up. Bruised chest and has been having chest discomfort. No broken bones. Was given pain medication and does get relief.  Was told that she needed to let Dr. Graciela Husbands know. She lives with her husband and daughter lives close to keep check on her.

## 2013-04-13 ENCOUNTER — Other Ambulatory Visit: Payer: Self-pay | Admitting: Internal Medicine

## 2013-04-15 DIAGNOSIS — W19XXXA Unspecified fall, initial encounter: Secondary | ICD-10-CM | POA: Diagnosis not present

## 2013-04-15 DIAGNOSIS — I4891 Unspecified atrial fibrillation: Secondary | ICD-10-CM | POA: Diagnosis not present

## 2013-04-15 NOTE — Telephone Encounter (Signed)
Dr. Graciela Husbands made aware 04/13/13

## 2013-04-28 ENCOUNTER — Other Ambulatory Visit: Payer: Self-pay | Admitting: Internal Medicine

## 2013-04-28 ENCOUNTER — Ambulatory Visit (INDEPENDENT_AMBULATORY_CARE_PROVIDER_SITE_OTHER): Payer: Medicare Other | Admitting: *Deleted

## 2013-04-28 DIAGNOSIS — Z7901 Long term (current) use of anticoagulants: Secondary | ICD-10-CM | POA: Diagnosis not present

## 2013-04-28 DIAGNOSIS — I4891 Unspecified atrial fibrillation: Secondary | ICD-10-CM

## 2013-05-03 ENCOUNTER — Ambulatory Visit (INDEPENDENT_AMBULATORY_CARE_PROVIDER_SITE_OTHER): Payer: Medicare Other | Admitting: Emergency Medicine

## 2013-05-03 VITALS — BP 142/82 | HR 95 | Temp 99.0°F | Resp 20 | Ht 61.5 in | Wt 119.8 lb

## 2013-05-03 DIAGNOSIS — K5641 Fecal impaction: Secondary | ICD-10-CM

## 2013-05-03 NOTE — Progress Notes (Signed)
[facility] [address] [place] [address] [phone]  Date:  05/03/2013   Name:  [name]   DOB:  1925/06/27   MRN:  [id number]  PCP:  Juline Patch, MD    Chief Complaint: Constipation and Abdominal Cramping   History of Present Illness:  [name] is a 79 y.o. very pleasant female patient who presents with the following:  Has been constipated and today not able to eat.  No stool in five days.  No nausea or vomiting.  No fever or chills. No blood in stool.  No improvement with OTC meds,   No history of prior episodes.  Was taking pain medication.  No improvement with over the counter medications or other home remedies. Denies other complaint or health concern today.   Patient Active Problem List   Diagnosis Date Noted  . Fall 04/06/2013  . Long term (current) use of anticoagulants 11/20/2012  . Fatigue 02/10/2012  . Chest pain 02/10/2012  . HEADACHE 07/31/2010  . HYPERLIPIDEMIA 05/28/2010  . HYPERTENSION, BENIGN 10/31/2009  . ATRIAL FIBRILLATION 10/31/2009  . SVT/ PSVT/ PAT 09/04/2009    Past Medical History  Diagnosis Date  . Atrial fibrillation     failure to tolerate Pradaxa secondary to GI symptoms  . First degree AV block   . SVT (supraventricular tachycardia)     possible atrial tachycardia, possible junctional tacycardia  . Hypertension   . Hypothyroidism   . Hyperlipidemia   . Gastroesophageal reflux disease   . Seasonal allergies   . H/O: hysterectomy   . Hx of echocardiogram     echo 07/2009:  mild focal basal septal hypertrophy, EF 55-60%, mild MR, mod LAE, mild RAE, PASP 45    Past Surgical History  Procedure Laterality Date  . Laparoscopic bilateral salpingo-oophorectomy    . Arthroscopic knee surgery right    . Abdominal hysterectomy      History  Substance Use Topics  . Smoking status: Never Smoker   . Smokeless tobacco: Not on file  . Alcohol Use: No    History reviewed. No pertinent family  history.  Allergies  Allergen Reactions  . Dabigatran   . Sulfonamide Derivatives     REACTION: hives    Medication list has been reviewed and updated.  Current Outpatient Prescriptions on File Prior to Visit  Medication Sig Dispense Refill  . diltiazem (CARDIZEM CD) 120 MG 24 hr capsule TAKE 1 CAPSULE (120 MG TOTAL) BY MOUTH DAILY.  90 capsule  1  . fexofenadine (ALLEGRA) 180 MG tablet Take 180 mg by mouth daily.        [name] levothyroxine (SYNTHROID, LEVOTHROID) 50 MCG tablet Take 50 mcg by mouth daily.        . metoprolol succinate (TOPROL-XL) 100 MG 24 hr tablet Take 1 tablet (100 mg total) by mouth daily with breakfast. Take with or immediately following a meal.  90 tablet  3  . Multiple Vitamins-Minerals (VITEYES AREDS FORMULA/LUTEIN) CAPS Take by mouth 2 (two) times daily.      . multivitamin (THERAGRAN) per tablet Take 1 tablet by mouth daily.        [name] omeprazole (PRILOSEC OTC) 20 MG tablet Take 20 mg by mouth daily. As needed      . TIKOSYN 125 MCG capsule TAKE 1 CAPSULE (125 MCG) BY MOUTH Two times daily AS DIRECTED. CONSULTMD BEFORE TAKING ANY NEW MEDICATIONS INCLUDING OTC/HERBAL PRODUCTS.  180 capsule  1  . warfarin (COUMADIN) 2.5 MG  tablet TAKE AS DIRECTED BY ANTICOAGULATION CLINIC  35 tablet  3  . diltiazem (CARDIZEM SR) 120 MG 12 hr capsule Take 120 mg by mouth at bedtime.      [name] HYDROcodone-acetaminophen (HYCET) 7.5-325 mg/15 ml solution Take 5 mLs by mouth 4 (four) times daily as needed for pain.  120 mL  0  . [DISCONTINUED] pantoprazole (PROTONIX) 20 MG tablet Take 1 tablet (20 mg total) by mouth daily.  30 tablet  11  . [DISCONTINUED] pravastatin (PRAVACHOL) 80 MG tablet Take 40 mg by mouth Daily.       . [DISCONTINUED] spironolactone (ALDACTONE) 25 MG tablet Take 1/2 tablet by mouth once daily       No current facility-administered medications on file prior to visit.    Review of Systems:  As per HPI, otherwise negative.    Physical Examination: Filed Vitals:    05/03/13 2013  BP: 142/82  Pulse: 95  Temp: 99 F (37.2 C)  Resp: 20   Filed Vitals:   05/03/13 2013  Height: 5' 1.5" (1.562 m)  Weight: 119 lb 12.8 oz (54.341 kg)   Body mass index is 22.27 kg/(m^2). Ideal Body Weight: Weight in (lb) to have BMI = 25: 134.2   GEN: WDWN, NAD, Non-toxic, Alert & Oriented x 3 HEENT: Atraumatic, Normocephalic.  Ears and Nose: No external deformity. EXTR: No clubbing/cyanosis/edema NEURO: Normal gait.  PSYCH: Normally interactive. Conversant. Not depressed or anxious appearing.  Calm demeanor.  ABDOMEN  Soft and not tender DRA:  Large stool mass in rectum.    Assessment and Plan: Fecal impaction miralax Fleets tonight  Signed,  Phillips Odor, MD

## 2013-05-03 NOTE — Patient Instructions (Addendum)
Fecal Impaction  A fecal impaction happens when there is a large, firm amount of stool (poop) that cannot be passed. The impacted stool is usually in the rectum, which is the lowest part of the large bowel. The impacted stool can block the colon and cause significant problems.  CAUSES   The longer stool stays in the rectum, the harder it gets. Anything that slows down your bowel movements can lead to fecal impaction. These conditions include:   Constipation (having firm hard stools). This can be a longstanding (chronic) problem, or can happen suddenly (acutely).   Painful conditions of the rectum, such as hemorrhoids or anal fissures. The pain of these conditions can make you try to avoid having bowel movements.   Narcotic pain medications cause constipation, which can sometimes be severe. If you take narcotic pain medication, you should also talk with your caregiver about preventing constipation.   Not getting enough fluids.   Inactivity and bed rest over long periods of time.  SYMPTOMS   Some symptoms of fecal impaction include:   Lack of normal bowel movements or changes in bowel patterns.   Sense of fullness in the rectum, but unable to pass stool.   Pain or cramps in the stomach or abdominal area (often after meals).   Thin, watery discharge from the rectum.  Without treatment, a fecal impaction can block the colon and cause severe abdominal pain or colon tears (perforation). This may lead to surgery.   DIAGNOSIS   Fecal impaction is suspected based upon your symptoms and upon a physical examination. This will include an exam of your rectum, which can confirm the diagnosis. Sometimes x-rays or lab testing may be needed to confirm the diagnosis and be sure there are no other problems.   TREATMENT    Initially an impaction can be removed manually. Your caregiver, using a gloved finger, can remove hard stool from your rectum.   Medication is sometimes needed. A suppository or enema can be given in the  rectum to soften the stool. This can stimulate a bowel movement. Medicines can also be given by mouth (orally).   Surgery may be needed if the colon has torn (perforated) due to blockage. This is very rare.  HOME CARE INSTRUCTIONS    Develop regular bowel habits. This may be something such as getting in the habit of having a bowel movement after your morning cup of coffee or after eating. Be sure to allow yourself enough time on the toilet.   Maintain a high fiber diet.   Drink plenty of fluids each day. This is especially true for the elderly and especially during cold weather when thirst may not be as strong. Try to take in at least eight, 8 ounce glasses of water daily unless instructed otherwise.   Exercise regularly.   If you begin to get constipated, increase the amount of fiber in your diet. Eat plenty of fruits, vegetables, whole wheat breads, bran, oatmeal and similar products.   Natural fiber laxatives such as Metamucil are also very helpful.   Speak with your caregiver if you suspect medications may be causing constipation.  SEEK MEDICAL CARE IF:    You have ongoing constipation or a hard time passing your stools.   You have ongoing rectal pain.   You require enemas or suppositories more than twice a week.  SEEK IMMEDIATE MEDICAL CARE IF:    There are continued problems or you develop abdominal pain.   You develop rectal bleeding.     You develop black or tarry stools or feel lightheaded.  MAKE SURE YOU:    Understand these instructions.   Will watch your condition.   Will get help right away if you are not doing well or get worse.  Document Released: 04/06/2004 Document Revised: 10/07/2011 Document Reviewed: 03/24/2008  [employer] Patient Information 2014 [employer].

## 2013-05-12 DIAGNOSIS — I10 Essential (primary) hypertension: Secondary | ICD-10-CM | POA: Diagnosis not present

## 2013-05-12 DIAGNOSIS — R5381 Other malaise: Secondary | ICD-10-CM | POA: Diagnosis not present

## 2013-05-12 DIAGNOSIS — Z23 Encounter for immunization: Secondary | ICD-10-CM | POA: Diagnosis not present

## 2013-05-14 DIAGNOSIS — I10 Essential (primary) hypertension: Secondary | ICD-10-CM | POA: Diagnosis not present

## 2013-05-14 DIAGNOSIS — N390 Urinary tract infection, site not specified: Secondary | ICD-10-CM | POA: Diagnosis not present

## 2013-05-31 DIAGNOSIS — Z7901 Long term (current) use of anticoagulants: Secondary | ICD-10-CM | POA: Diagnosis not present

## 2013-05-31 DIAGNOSIS — I4891 Unspecified atrial fibrillation: Secondary | ICD-10-CM | POA: Diagnosis not present

## 2013-05-31 DIAGNOSIS — I10 Essential (primary) hypertension: Secondary | ICD-10-CM | POA: Diagnosis not present

## 2013-05-31 DIAGNOSIS — E7800 Pure hypercholesterolemia, unspecified: Secondary | ICD-10-CM | POA: Diagnosis not present

## 2013-05-31 DIAGNOSIS — F411 Generalized anxiety disorder: Secondary | ICD-10-CM | POA: Diagnosis not present

## 2013-05-31 DIAGNOSIS — Z006 Encounter for examination for normal comparison and control in clinical research program: Secondary | ICD-10-CM | POA: Diagnosis not present

## 2013-06-07 DIAGNOSIS — M25539 Pain in unspecified wrist: Secondary | ICD-10-CM | POA: Diagnosis not present

## 2013-06-07 DIAGNOSIS — M19049 Primary osteoarthritis, unspecified hand: Secondary | ICD-10-CM | POA: Diagnosis not present

## 2013-06-07 DIAGNOSIS — M11239 Other chondrocalcinosis, unspecified wrist: Secondary | ICD-10-CM | POA: Diagnosis not present

## 2013-06-25 ENCOUNTER — Other Ambulatory Visit: Payer: Self-pay | Admitting: Internal Medicine

## 2013-07-01 ENCOUNTER — Other Ambulatory Visit: Payer: Self-pay | Admitting: Internal Medicine

## 2013-07-23 ENCOUNTER — Telehealth: Payer: Self-pay | Admitting: Internal Medicine

## 2013-07-23 NOTE — Telephone Encounter (Signed)
New Message  Pt called states that she is on coumadin and she had bad fall last night (12/25). She asks for a same day appt and requests a CT scan of his head.. Please assist.

## 2013-07-23 NOTE — Telephone Encounter (Signed)
Pt c/o that she fell backwards yesterday and landed on her right side and lightly hitting her head. She states that she has a hematoma on her RUL but nothing on her head. She states that she feels achy, weak, tired, but no headache. I reviewed pt complaint with Norma Fredrickson, NP - advised pt to go to the ED to be seen. Pt verbalized understanding of instructions.

## 2013-07-28 DIAGNOSIS — Z9181 History of falling: Secondary | ICD-10-CM | POA: Diagnosis not present

## 2013-07-28 DIAGNOSIS — I4891 Unspecified atrial fibrillation: Secondary | ICD-10-CM | POA: Diagnosis not present

## 2013-07-28 DIAGNOSIS — R5381 Other malaise: Secondary | ICD-10-CM | POA: Diagnosis not present

## 2013-07-28 DIAGNOSIS — Z7901 Long term (current) use of anticoagulants: Secondary | ICD-10-CM | POA: Diagnosis not present

## 2013-08-06 ENCOUNTER — Ambulatory Visit: Payer: Medicare Other | Admitting: Cardiology

## 2013-08-10 DIAGNOSIS — Z7901 Long term (current) use of anticoagulants: Secondary | ICD-10-CM | POA: Diagnosis not present

## 2013-08-10 DIAGNOSIS — I4891 Unspecified atrial fibrillation: Secondary | ICD-10-CM | POA: Diagnosis not present

## 2013-08-10 DIAGNOSIS — I10 Essential (primary) hypertension: Secondary | ICD-10-CM | POA: Diagnosis not present

## 2013-08-17 DIAGNOSIS — Z7901 Long term (current) use of anticoagulants: Secondary | ICD-10-CM | POA: Diagnosis not present

## 2013-08-17 DIAGNOSIS — I4891 Unspecified atrial fibrillation: Secondary | ICD-10-CM | POA: Diagnosis not present

## 2013-08-24 DIAGNOSIS — H40019 Open angle with borderline findings, low risk, unspecified eye: Secondary | ICD-10-CM | POA: Diagnosis not present

## 2013-08-24 DIAGNOSIS — H04129 Dry eye syndrome of unspecified lacrimal gland: Secondary | ICD-10-CM | POA: Diagnosis not present

## 2013-08-24 DIAGNOSIS — Z961 Presence of intraocular lens: Secondary | ICD-10-CM | POA: Diagnosis not present

## 2013-08-24 DIAGNOSIS — H353190 Nonexudative age-related macular degeneration, unspecified eye, stage unspecified: Secondary | ICD-10-CM | POA: Diagnosis not present

## 2013-09-27 DIAGNOSIS — I4891 Unspecified atrial fibrillation: Secondary | ICD-10-CM | POA: Diagnosis not present

## 2013-09-27 DIAGNOSIS — Z7901 Long term (current) use of anticoagulants: Secondary | ICD-10-CM | POA: Diagnosis not present

## 2013-09-27 DIAGNOSIS — N390 Urinary tract infection, site not specified: Secondary | ICD-10-CM | POA: Diagnosis not present

## 2013-09-27 DIAGNOSIS — I10 Essential (primary) hypertension: Secondary | ICD-10-CM | POA: Diagnosis not present

## 2013-09-29 ENCOUNTER — Other Ambulatory Visit: Payer: Self-pay | Admitting: Internal Medicine

## 2013-11-09 DIAGNOSIS — I10 Essential (primary) hypertension: Secondary | ICD-10-CM | POA: Diagnosis not present

## 2013-11-09 DIAGNOSIS — I4891 Unspecified atrial fibrillation: Secondary | ICD-10-CM | POA: Diagnosis not present

## 2013-11-09 DIAGNOSIS — Z7901 Long term (current) use of anticoagulants: Secondary | ICD-10-CM | POA: Diagnosis not present

## 2013-11-10 DIAGNOSIS — M1990 Unspecified osteoarthritis, unspecified site: Secondary | ICD-10-CM | POA: Diagnosis not present

## 2013-11-10 DIAGNOSIS — I10 Essential (primary) hypertension: Secondary | ICD-10-CM | POA: Diagnosis not present

## 2013-11-10 DIAGNOSIS — I4891 Unspecified atrial fibrillation: Secondary | ICD-10-CM | POA: Diagnosis not present

## 2013-11-10 DIAGNOSIS — E039 Hypothyroidism, unspecified: Secondary | ICD-10-CM | POA: Diagnosis not present

## 2013-12-21 DIAGNOSIS — I4891 Unspecified atrial fibrillation: Secondary | ICD-10-CM | POA: Diagnosis not present

## 2013-12-21 DIAGNOSIS — Z7901 Long term (current) use of anticoagulants: Secondary | ICD-10-CM | POA: Diagnosis not present

## 2013-12-21 DIAGNOSIS — I10 Essential (primary) hypertension: Secondary | ICD-10-CM | POA: Diagnosis not present

## 2014-02-01 DIAGNOSIS — I4891 Unspecified atrial fibrillation: Secondary | ICD-10-CM | POA: Diagnosis not present

## 2014-02-01 DIAGNOSIS — Z7901 Long term (current) use of anticoagulants: Secondary | ICD-10-CM | POA: Diagnosis not present

## 2014-02-24 DIAGNOSIS — H04129 Dry eye syndrome of unspecified lacrimal gland: Secondary | ICD-10-CM | POA: Diagnosis not present

## 2014-02-24 DIAGNOSIS — H40019 Open angle with borderline findings, low risk, unspecified eye: Secondary | ICD-10-CM | POA: Diagnosis not present

## 2014-03-01 DIAGNOSIS — Z7901 Long term (current) use of anticoagulants: Secondary | ICD-10-CM | POA: Diagnosis not present

## 2014-03-01 DIAGNOSIS — I4891 Unspecified atrial fibrillation: Secondary | ICD-10-CM | POA: Diagnosis not present

## 2014-03-29 DIAGNOSIS — I4891 Unspecified atrial fibrillation: Secondary | ICD-10-CM | POA: Diagnosis not present

## 2014-03-29 DIAGNOSIS — N390 Urinary tract infection, site not specified: Secondary | ICD-10-CM | POA: Diagnosis not present

## 2014-03-29 DIAGNOSIS — Z7901 Long term (current) use of anticoagulants: Secondary | ICD-10-CM | POA: Diagnosis not present

## 2014-03-30 DIAGNOSIS — M545 Low back pain, unspecified: Secondary | ICD-10-CM | POA: Diagnosis not present

## 2014-03-30 DIAGNOSIS — M47817 Spondylosis without myelopathy or radiculopathy, lumbosacral region: Secondary | ICD-10-CM | POA: Diagnosis not present

## 2014-03-30 DIAGNOSIS — R350 Frequency of micturition: Secondary | ICD-10-CM | POA: Diagnosis not present

## 2014-03-30 DIAGNOSIS — M4120 Other idiopathic scoliosis, site unspecified: Secondary | ICD-10-CM | POA: Diagnosis not present

## 2014-03-30 DIAGNOSIS — L570 Actinic keratosis: Secondary | ICD-10-CM | POA: Diagnosis not present

## 2014-03-30 DIAGNOSIS — Z23 Encounter for immunization: Secondary | ICD-10-CM | POA: Diagnosis not present

## 2014-03-30 DIAGNOSIS — R5383 Other fatigue: Secondary | ICD-10-CM | POA: Diagnosis not present

## 2014-03-30 DIAGNOSIS — R5381 Other malaise: Secondary | ICD-10-CM | POA: Diagnosis not present

## 2014-03-30 DIAGNOSIS — K140 Glossitis: Secondary | ICD-10-CM | POA: Diagnosis not present

## 2014-03-30 DIAGNOSIS — M810 Age-related osteoporosis without current pathological fracture: Secondary | ICD-10-CM | POA: Diagnosis not present

## 2014-03-31 ENCOUNTER — Ambulatory Visit (INDEPENDENT_AMBULATORY_CARE_PROVIDER_SITE_OTHER): Payer: Medicare Other | Admitting: Internal Medicine

## 2014-03-31 VITALS — BP 188/84 | HR 67 | Ht 62.0 in | Wt 120.0 lb

## 2014-03-31 DIAGNOSIS — I10 Essential (primary) hypertension: Secondary | ICD-10-CM | POA: Diagnosis not present

## 2014-03-31 DIAGNOSIS — I4891 Unspecified atrial fibrillation: Secondary | ICD-10-CM | POA: Diagnosis not present

## 2014-03-31 MED ORDER — AMLODIPINE BESYLATE 10 MG PO TABS: 10.0000 mg | ORAL_TABLET | Freq: Every day | ORAL | Status: DC

## 2014-03-31 MED ORDER — CLONIDINE HCL 0.1 MG PO TABS
0.1000 mg | ORAL_TABLET | Freq: Once | ORAL | Status: AC
  Administered 2014-03-31: 0.1 mg via ORAL

## 2014-03-31 NOTE — Patient Instructions (Addendum)
Your physician has recommended you make the following change in your medication:  1) STOP Diltiazem 2) START Amlodipine 10 mg daily  (take this for 30 days, before starting new beta blocker below)   After 30 days, stop Metoprolol Succinate that you are taking now, and start the following: You were given two handwritten prescriptions -- DO NOT TAKE THESE MEDICATIONS AT THE SAME TIME -- TRY ONE AT A TIME ONLY  Try one for 30 days, if not working then try next. Please call us and let us know which one works best for you.  - Atenolol 100 mg daily  - Metoprolol 100 mg twice daily  Your physician recommends that you schedule a follow-up appointment in: 6 months with Dr. Caryl Comes.  Please call Sander Speckman, RN with lab results (we need Magnesium and Potassium levels)

## 2014-03-31 NOTE — Progress Notes (Signed)
Patient Care Team: Tommy Medal, MD as PCP - General   HPI  [name] is a 80 y.o. female  seen in followup for atrial fibrillation for which she takes Tikosyn. She also has hypertension which has been poorly controlled.   Her husband has died intercurrently. She is feeling some better but still remains quite exhausted. Over the last 2 days she's feeling somewhat worse.     Past Medical History  Diagnosis Date  . Atrial fibrillation     failure to tolerate Pradaxa secondary to GI symptoms  . First degree AV block   . SVT (supraventricular tachycardia)     possible atrial tachycardia, possible junctional tacycardia  . Hypertension   . Hypothyroidism   . Hyperlipidemia   . Gastroesophageal reflux disease   . Seasonal allergies   . H/O: hysterectomy   . Hx of echocardiogram     echo 07/2009:  mild focal basal septal hypertrophy, EF 55-60%, mild MR, mod LAE, mild RAE, PASP 45    Past Surgical History  Procedure Laterality Date  . Laparoscopic bilateral salpingo-oophorectomy    . Arthroscopic knee surgery right    . Abdominal hysterectomy      Current Outpatient Prescriptions  Medication Sig Dispense Refill  . diltiazem (CARDIZEM SR) 120 MG 12 hr capsule Take 120 mg by mouth at bedtime.      . fexofenadine (ALLEGRA) 180 MG tablet Take 180 mg by mouth daily.        [name] levothyroxine (SYNTHROID, LEVOTHROID) 50 MCG tablet Take 50 mcg by mouth daily.        . metoprolol succinate (TOPROL-XL) 100 MG 24 hr tablet TAKE 1 TABLET BY MOUTH EVERY MORNING WITH BREAKFAST. TAKE WITH OR IMMEDIATELY FOLLOWING A MEAL  90 tablet  3  . Multiple Vitamins-Minerals (VITEYES AREDS FORMULA/LUTEIN) CAPS Take by mouth daily.       [name] omeprazole (PRILOSEC OTC) 20 MG tablet Take 20 mg by mouth daily. As needed      . TIKOSYN 125 MCG capsule TAKE 1 CAPSULE (125 MCG) BY MOUTH Two times daily AS DIRECTED. CONSULTMD BEFORE TAKING ANY NEW MEDICATIONS INCLUDING OTC/HERBAL PRODUCTS.  180 capsule  2  . warfarin  (COUMADIN) 2.5 MG tablet TAKE AS DIRECTED BY ANTICOAGULATION CLINIC  35 tablet  3  . [DISCONTINUED] pantoprazole (PROTONIX) 20 MG tablet Take 1 tablet (20 mg total) by mouth daily.  30 tablet  11  . [DISCONTINUED] pravastatin (PRAVACHOL) 80 MG tablet Take 40 mg by mouth Daily.       . [DISCONTINUED] spironolactone (ALDACTONE) 25 MG tablet Take 1/2 tablet by mouth once daily       No current facility-administered medications for this visit.    Allergies  Allergen Reactions  . Dabigatran   . Sulfonamide Derivatives     REACTION: hives    Review of Systems negative except from HPI and PMH  PE BP 188/84  Pulse 67  Ht 5\' 2"  (1.575 m)  Wt 120 lb (54.432 kg)  BMI 21.94 kg/m2  SpO2 98% Well developed and well nourished in no acute distress HENT normal E scleral and icterus clear Neck Supple JVP flat; carotids brisk and full Clear to ausculation  Regular rate and rhythm, no murmurs gallops or rub Soft with active bowel sounds No clubbing cyanosis none Edema Alert and oriented, grossly normal motor and sensory function Skin Warm and Dry  ECG sinus rhythm at 73 Intervals 22/08/43 with a QTC of 45  Assessment and  Plan  Atrial fibrillation  Hypertensive urgency  Fatigue  We'll continue her on her dofetilide. Surveillance laboratories when they were taken by her PCP yesterday; I have reviewed with the daughter appropriate target levels.  Her blood pressures excessive. We will give her dose of clonidine in the office. This may explain some of the lethargy over the last few days. We'll also change her diltiazem--amlodipine we'll to help with blood pressure as well as to see if we can   exclude diltiazem is contributing to her fatigue.  Her beta blocker may also be contributing to her fatigue; hence, a month or so from now, we'll have her change her beta blockers sequentially to atenolol 100 mg daily and metoprolol tartrate 100 mg twice daily to see if we can implicate one of her  drugs as contributing to her fatigue.  She is to follow up with her PCP by phone over the next 24 hours about her blood pressure and her laboratories

## 2014-04-01 ENCOUNTER — Encounter: Payer: Self-pay | Admitting: Internal Medicine

## 2014-04-06 ENCOUNTER — Ambulatory Visit: Payer: Medicare Other | Admitting: Internal Medicine

## 2014-04-19 ENCOUNTER — Other Ambulatory Visit: Payer: Self-pay | Admitting: Internal Medicine

## 2014-04-28 ENCOUNTER — Ambulatory Visit (INDEPENDENT_AMBULATORY_CARE_PROVIDER_SITE_OTHER): Payer: Medicare Other | Admitting: Family Medicine

## 2014-04-28 VITALS — BP 120/80 | HR 81 | Temp 97.8°F | Resp 16 | Ht 62.5 in | Wt 120.0 lb

## 2014-04-28 DIAGNOSIS — K5641 Fecal impaction: Secondary | ICD-10-CM | POA: Diagnosis not present

## 2014-04-28 DIAGNOSIS — K5900 Constipation, unspecified: Secondary | ICD-10-CM

## 2014-04-28 DIAGNOSIS — N390 Urinary tract infection, site not specified: Secondary | ICD-10-CM

## 2014-04-28 NOTE — Progress Notes (Signed)
Subjective:    Patient ID: [name], female    DOB: 10/24/1924, 80 y.o.   MRN: [id number] Chief Complaint  Patient presents with  . Constipation    x 3 days    HPI  She recently had a URI that other family members had as well - her daughter and son-in-law saw their doctor who told them viral and pt never had a fever.  Last stool was 3d ago. Tried an enema this morning which was unsuccessful.  Has sella which is colace but also has a mild laxative in it w/o relief. Recently ran out of colace.   Usually takes miralax once a day but occ skips which she did recently when she had diarrhea with her virus.  Did vomit this morning and has had a fair amount of back pain.  Had a UA several wks ago. Past Medical History  Diagnosis Date  . Atrial fibrillation     failure to tolerate Pradaxa secondary to GI symptoms  . First degree AV block   . SVT (supraventricular tachycardia)     possible atrial tachycardia, possible junctional tacycardia  . Hypertension   . Hypothyroidism   . Hyperlipidemia   . Gastroesophageal reflux disease   . Seasonal allergies   . H/O: hysterectomy   . Hx of echocardiogram     echo 07/2009:  mild focal basal septal hypertrophy, EF 55-60%, mild MR, mod LAE, mild RAE, PASP 45   Current Outpatient Prescriptions on File Prior to Visit  Medication Sig Dispense Refill  . amLODipine (NORVASC) 10 MG tablet Take 1 tablet (10 mg total) by mouth daily.  30 tablet  6  . fexofenadine (ALLEGRA) 180 MG tablet Take 180 mg by mouth daily.        [name] levothyroxine (SYNTHROID, LEVOTHROID) 50 MCG tablet Take 50 mcg by mouth daily.        . metoprolol succinate (TOPROL-XL) 100 MG 24 hr tablet TAKE 1 TABLET BY MOUTH EVERY MORNING WITH BREAKFAST. TAKE WITH OR IMMEDIATELY FOLLOWING A MEAL  90 tablet  3  . Multiple Vitamins-Minerals (VITEYES AREDS FORMULA/LUTEIN) CAPS Take by mouth daily.       [name] omeprazole (PRILOSEC OTC) 20 MG tablet Take 20 mg by mouth daily. As needed      .  TIKOSYN 125 MCG capsule TAKE 1 CAPSULE (125 MCG) BY MOUTH TWO TIMES DAILY AS DIRECTED. CONSULTMD BEFORE TAKING ANY NEW MEDICATIONS INCLUDING OTC/HERBAL PRODUCTS.  180 capsule  1  . warfarin (COUMADIN) 2.5 MG tablet TAKE AS DIRECTED BY ANTICOAGULATION CLINIC  35 tablet  3  . [DISCONTINUED] pantoprazole (PROTONIX) 20 MG tablet Take 1 tablet (20 mg total) by mouth daily.  30 tablet  11  . [DISCONTINUED] pravastatin (PRAVACHOL) 80 MG tablet Take 40 mg by mouth Daily.       . [DISCONTINUED] spironolactone (ALDACTONE) 25 MG tablet Take 1/2 tablet by mouth once daily       No current facility-administered medications on file prior to visit.   Allergies  Allergen Reactions  . Dabigatran   . Sulfonamide Derivatives     REACTION: hives     Review of Systems  Constitutional: Positive for activity change, appetite change and fatigue. Negative for fever, chills and unexpected weight change.  Respiratory: Negative for shortness of breath.   Cardiovascular: Negative for chest pain and leg swelling.  Gastrointestinal: Positive for nausea, vomiting, abdominal pain, constipation, blood in stool and abdominal distention. Negative for diarrhea.  Genitourinary: Negative for dysuria, decreased  urine volume and difficulty urinating.  Musculoskeletal: Positive for back pain. Negative for gait problem.  Skin: Negative for rash.  Hematological: Negative for adenopathy.       Objective:  BP 120/80  Pulse 81  Temp(Src) 97.8 F (36.6 C) (Oral)  Resp 16  Ht 5' 2.5" (1.588 m)  Wt 120 lb (54.432 kg)  BMI 21.59 kg/m2  SpO2 99%  Physical Exam  Constitutional: She is oriented to person, place, and time. She appears well-developed and well-nourished. No distress.  HENT:  Head: Normocephalic and atraumatic.  Neck: Normal range of motion. Neck supple. No thyromegaly present.  Cardiovascular: Normal rate, regular rhythm, normal heart sounds and intact distal pulses.   Pulmonary/Chest: Effort normal and breath  sounds normal. No respiratory distress.  Abdominal: Soft. Normal appearance and bowel sounds are normal. There is generalized tenderness. There is no rigidity, no rebound, no guarding and no CVA tenderness.  Genitourinary: Rectal exam shows external hemorrhoid and tenderness. Rectal exam shows no internal hemorrhoid, no mass and anal tone normal.  Large amount of stool present in mildly enlarged rectal vault  Musculoskeletal: She exhibits no edema.  Lymphadenopathy:    She has no cervical adenopathy.  Neurological: She is alert and oriented to person, place, and time.  Skin: Skin is warm and dry. She is not diaphoretic. No erythema.  Psychiatric: She has a normal mood and affect. Her behavior is normal.    Impacted stool removed by myself on digital rectal exam.  Was very painful and pt had trouble tolerating      Assessment & Plan:  Fecal impaction - Plan: POCT UA - Microscopic Only, POCT urinalysis dipstick, Urine culture  Constipation, unspecified constipation type - Plan: POCT UA - Microscopic Only, POCT urinalysis dipstick, Urine culture - do miralax cleanout then remain on daily to prevent recurrence.  Urinary tract infection without hematuria, site unspecified - Plan: POCT UA - Microscopic Only, POCT urinalysis dipstick, Urine culture Pt unable to give UA sample in office today so future order entered - pt will drop off urine sample as has urinary sxs and h/o prior UTIs when severely constipated.   Delman Cheadle, MD MPH

## 2014-04-28 NOTE — Patient Instructions (Signed)
Fecal Impaction °A fecal impaction happens when there is a large, firm amount of stool (or feces) that cannot be passed. The impacted stool is usually in the rectum, which is the lowest part of the large bowel. The impacted stool can block the colon and cause significant problems. °CAUSES  °The longer stool stays in the rectum, the harder it gets. Anything that slows down your bowel movements can lead to fecal impaction, such as: °· Constipation. This can be a long-standing (chronic) problem or can happen suddenly (acute). °· Painful conditions of the rectum, such as hemorrhoids or anal fissures. The pain of these conditions can make you try to avoid having bowel movements. °· Narcotic pain-relieving medicines, such as methadone, morphine, or codeine. °· Not drinking enough fluids. °· Inactivity and bed rest over long periods of time. °· Diseases of the brain or nervous system that damage the nerves controlling the muscles of the intestines. °SIGNS AND SYMPTOMS  °· Lack of normal bowel movements or changes in bowel patterns. °· Sense of fullness in the rectum but unable to pass stool. °· Pain or cramps in the abdominal area (often after meals). °· Thin, watery discharge from the rectum. °DIAGNOSIS  °Your health care provider may suspect that you have a fecal impaction based on your symptoms and a physical exam. This will include an exam of your rectum. Sometimes X-rays or lab testing may be needed to confirm the diagnosis and to be sure there are no other problems.  °TREATMENT  °· Initially an impaction can be removed manually. Using a gloved finger, your health care provider can remove hard stool from your rectum. °· Medicine is sometimes needed. A suppository or enema can be given in the rectum to soften the stool, which can stimulate a bowel movement. Medicines can also be given by mouth (orally). °· Though rare, surgery may be needed if the colon has torn (perforated) due to blockage. °HOME CARE INSTRUCTIONS   °· Develop regular bowel habits. This could include getting in the habit of having a bowel movement after your morning cup of coffee or after eating. Be sure to allow yourself enough time on the toilet. °· Maintain a high-fiber diet. °· Drink enough fluids to keep your urine clear or pale yellow as directed by your health care provider. °· Exercise regularly. °· If you begin to get constipated, increase the amount of fiber in your diet. Eat plenty of fruits, vegetables, whole wheat breads, bran, oatmeal, and similar products. °· Take natural fiber laxatives or other laxatives only as directed by your health care provider. °SEEK MEDICAL CARE IF:  °· You have ongoing rectal pain. °· You require enemas or suppositories more than twice a week. °· You have rectal bleeding. °· You have continued problems, or you develop abdominal pain. °· You have thin, pencil-like stools. °SEEK IMMEDIATE MEDICAL CARE IF:  °You have black or tarry stools. °MAKE SURE YOU:  °· Understand these instructions. °· Will watch your condition. °· Will get help right away if you are not doing well or get worse. °Document Released: 04/06/2004 Document Revised: 05/05/2013 Document Reviewed: 01/19/2013 °[employer]® Patient Information ©2015 [employer]. This information is not intended to replace advice given to you by your health care provider. Make sure you discuss any questions you have with your health care provider. °Constipation °Constipation is when a person has fewer than three bowel movements a week, has difficulty having a bowel movement, or has stools that are dry, hard, or larger than normal.   As people grow older, constipation is more common. If you try to fix constipation with medicines that make you have a bowel movement (laxatives), the problem may get worse. Long-term laxative use may cause the muscles of the colon to become weak. A low-fiber diet, not taking in enough fluids, and taking certain medicines may make constipation worse.   °CAUSES  °· Certain medicines, such as antidepressants, pain medicine, iron supplements, antacids, and water pills.   °· Certain diseases, such as diabetes, irritable bowel syndrome (IBS), thyroid disease, or depression.   °· Not drinking enough water.   °· Not eating enough fiber-rich foods.   °· Stress or travel.   °· Lack of physical activity or exercise.   °· Ignoring the urge to have a bowel movement.   °· Using laxatives too much.   °SIGNS AND SYMPTOMS  °· Having fewer than three bowel movements a week.   °· Straining to have a bowel movement.   °· Having stools that are hard, dry, or larger than normal.   °· Feeling full or bloated.   °· Pain in the lower abdomen.   °· Not feeling relief after having a bowel movement.   °DIAGNOSIS  °Your health care provider will take a medical history and perform a physical exam. Further testing may be done for severe constipation. Some tests may include: °· A barium enema X-ray to examine your rectum, colon, and, sometimes, your small intestine.   °· A sigmoidoscopy to examine your lower colon.   °· A colonoscopy to examine your entire colon. °TREATMENT  °Treatment will depend on the severity of your constipation and what is causing it. Some dietary treatments include drinking more fluids and eating more fiber-rich foods. Lifestyle treatments may include regular exercise. If these diet and lifestyle recommendations do not help, your health care provider may recommend taking over-the-counter laxative medicines to help you have bowel movements. Prescription medicines may be prescribed if over-the-counter medicines do not work.  °HOME CARE INSTRUCTIONS  °· Eat foods that have a lot of fiber, such as fruits, vegetables, whole grains, and beans. °· Limit foods high in fat and processed sugars, such as french fries, hamburgers, cookies, candies, and soda.   °· A fiber supplement may be added to your diet if you cannot get enough fiber from foods.   °· Drink enough fluids to keep  your urine clear or pale yellow.   °· Exercise regularly or as directed by your health care provider.   °· Go to the restroom when you have the urge to go. Do not hold it.   °· Only take over-the-counter or prescription medicines as directed by your health care provider. Do not take other medicines for constipation without talking to your health care provider first.   °SEEK IMMEDIATE MEDICAL CARE IF:  °· You have bright red blood in your stool.   °· Your constipation lasts for more than 4 days or gets worse.   °· You have abdominal or rectal pain.   °· You have thin, pencil-like stools.   °· You have unexplained weight loss. °MAKE SURE YOU:  °· Understand these instructions. °· Will watch your condition. °· Will get help right away if you are not doing well or get worse. °Document Released: 04/12/2004 Document Revised: 07/20/2013 Document Reviewed: 04/26/2013 °[employer]® Patient Information ©2015 [employer]. This information is not intended to replace advice given to you by your health care provider. Make sure you discuss any questions you have with your health care provider. ° °

## 2014-05-03 DIAGNOSIS — I4891 Unspecified atrial fibrillation: Secondary | ICD-10-CM | POA: Diagnosis not present

## 2014-05-03 DIAGNOSIS — Z7901 Long term (current) use of anticoagulants: Secondary | ICD-10-CM | POA: Diagnosis not present

## 2014-05-03 DIAGNOSIS — G4700 Insomnia, unspecified: Secondary | ICD-10-CM | POA: Diagnosis not present

## 2014-05-18 DIAGNOSIS — I10 Essential (primary) hypertension: Secondary | ICD-10-CM | POA: Diagnosis not present

## 2014-05-18 DIAGNOSIS — E780 Pure hypercholesterolemia: Secondary | ICD-10-CM | POA: Diagnosis not present

## 2014-05-18 DIAGNOSIS — L570 Actinic keratosis: Secondary | ICD-10-CM | POA: Diagnosis not present

## 2014-05-18 DIAGNOSIS — M545 Low back pain: Secondary | ICD-10-CM | POA: Diagnosis not present

## 2014-06-06 ENCOUNTER — Encounter: Payer: Self-pay | Admitting: Internal Medicine

## 2014-06-14 DIAGNOSIS — I4891 Unspecified atrial fibrillation: Secondary | ICD-10-CM | POA: Diagnosis not present

## 2014-06-14 DIAGNOSIS — Z7901 Long term (current) use of anticoagulants: Secondary | ICD-10-CM | POA: Diagnosis not present

## 2014-06-21 ENCOUNTER — Telehealth: Payer: Self-pay | Admitting: Internal Medicine

## 2014-06-21 NOTE — Telephone Encounter (Signed)
New message      Pt says her feet, ankles, and bottom of legs swell every day since being put on norvasc.  Please advise.  Please call before 10:30 Wednesday.

## 2014-06-22 NOTE — Telephone Encounter (Signed)
Informed patient to stop Norvasc and call PCP. She verbalized understanding and agreeable.

## 2014-06-22 NOTE — Telephone Encounter (Signed)
Follow up     Pt is returning Sherri's call

## 2014-06-22 NOTE — Telephone Encounter (Signed)
lmtcb  (Reviewed with Dr. Caryl Comes -- need to inform patient to stop Norvasc and follow up with PCP about  BP and possible different medication)

## 2014-06-22 NOTE — Telephone Encounter (Signed)
Routing to Dr. Caryl Comes (who is in hospital today) for advisement.

## 2014-06-28 DIAGNOSIS — L820 Inflamed seborrheic keratosis: Secondary | ICD-10-CM | POA: Diagnosis not present

## 2014-06-28 DIAGNOSIS — I10 Essential (primary) hypertension: Secondary | ICD-10-CM | POA: Diagnosis not present

## 2014-07-06 DIAGNOSIS — L820 Inflamed seborrheic keratosis: Secondary | ICD-10-CM | POA: Diagnosis not present

## 2014-07-06 DIAGNOSIS — I10 Essential (primary) hypertension: Secondary | ICD-10-CM | POA: Diagnosis not present

## 2014-07-11 DIAGNOSIS — H6091 Unspecified otitis externa, right ear: Secondary | ICD-10-CM | POA: Diagnosis not present

## 2014-07-11 DIAGNOSIS — R42 Dizziness and giddiness: Secondary | ICD-10-CM | POA: Diagnosis not present

## 2014-07-11 DIAGNOSIS — J329 Chronic sinusitis, unspecified: Secondary | ICD-10-CM | POA: Diagnosis not present

## 2014-07-26 DIAGNOSIS — I4891 Unspecified atrial fibrillation: Secondary | ICD-10-CM | POA: Diagnosis not present

## 2014-07-26 DIAGNOSIS — I10 Essential (primary) hypertension: Secondary | ICD-10-CM | POA: Diagnosis not present

## 2014-07-26 DIAGNOSIS — Z7901 Long term (current) use of anticoagulants: Secondary | ICD-10-CM | POA: Diagnosis not present

## 2014-07-29 DIAGNOSIS — IMO0002 Reserved for concepts with insufficient information to code with codable children: Secondary | ICD-10-CM

## 2014-07-29 HISTORY — DX: Reserved for concepts with insufficient information to code with codable children: IMO0002

## 2014-07-29 HISTORY — PX: SQUAMOUS CELL CARCINOMA EXCISION: SHX2433

## 2014-08-05 DIAGNOSIS — I10 Essential (primary) hypertension: Secondary | ICD-10-CM | POA: Diagnosis not present

## 2014-08-05 DIAGNOSIS — Z23 Encounter for immunization: Secondary | ICD-10-CM | POA: Diagnosis not present

## 2014-08-05 DIAGNOSIS — R110 Nausea: Secondary | ICD-10-CM | POA: Diagnosis not present

## 2014-08-07 ENCOUNTER — Emergency Department (HOSPITAL_COMMUNITY)
Admission: EM | Admit: 2014-08-07 | Discharge: 2014-08-08 | Disposition: A | Discharge status: Home / Self Care | Payer: Medicare Other | Attending: Emergency Medicine | Admitting: Emergency Medicine

## 2014-08-07 ENCOUNTER — Encounter (HOSPITAL_COMMUNITY): Payer: Self-pay | Admitting: Emergency Medicine

## 2014-08-07 DIAGNOSIS — Z7901 Long term (current) use of anticoagulants: Secondary | ICD-10-CM | POA: Insufficient documentation

## 2014-08-07 DIAGNOSIS — R531 Weakness: Secondary | ICD-10-CM | POA: Insufficient documentation

## 2014-08-07 DIAGNOSIS — I4891 Unspecified atrial fibrillation: Secondary | ICD-10-CM | POA: Diagnosis not present

## 2014-08-07 DIAGNOSIS — I10 Essential (primary) hypertension: Secondary | ICD-10-CM | POA: Diagnosis not present

## 2014-08-07 DIAGNOSIS — E039 Hypothyroidism, unspecified: Secondary | ICD-10-CM | POA: Insufficient documentation

## 2014-08-07 DIAGNOSIS — K219 Gastro-esophageal reflux disease without esophagitis: Secondary | ICD-10-CM | POA: Diagnosis not present

## 2014-08-07 DIAGNOSIS — Z79899 Other long term (current) drug therapy: Secondary | ICD-10-CM | POA: Diagnosis not present

## 2014-08-07 DIAGNOSIS — Z90710 Acquired absence of both cervix and uterus: Secondary | ICD-10-CM | POA: Diagnosis not present

## 2014-08-07 LAB — CBC WITH DIFFERENTIAL/PLATELET
BASOS PCT: 1 % (ref 0–1)
Basophils Absolute: 0.1 10*3/uL (ref 0.0–0.1)
EOS PCT: 2 % (ref 0–5)
Eosinophils Absolute: 0.1 10*3/uL (ref 0.0–0.7)
HCT: 35.9 % — ABNORMAL LOW (ref 36.0–46.0)
HEMOGLOBIN: 11.3 g/dL — AB (ref 12.0–15.0)
LYMPHS PCT: 31 % (ref 12–46)
Lymphs Abs: 1.7 10*3/uL (ref 0.7–4.0)
MCH: 25.7 pg — ABNORMAL LOW (ref 26.0–34.0)
MCHC: 31.5 g/dL (ref 30.0–36.0)
MCV: 81.6 fL (ref 78.0–100.0)
MONO ABS: 0.5 10*3/uL (ref 0.1–1.0)
MONOS PCT: 10 % (ref 3–12)
NEUTROS ABS: 3 10*3/uL (ref 1.7–7.7)
Neutrophils Relative %: 56 % (ref 43–77)
Platelets: 266 10*3/uL (ref 150–400)
RBC: 4.4 MIL/uL (ref 3.87–5.11)
RDW: 14.2 % (ref 11.5–15.5)
WBC: 5.4 10*3/uL (ref 4.0–10.5)

## 2014-08-07 LAB — BASIC METABOLIC PANEL
ANION GAP: 7 (ref 5–15)
BUN: 15 mg/dL (ref 6–23)
CALCIUM: 9.1 mg/dL (ref 8.4–10.5)
CHLORIDE: 101 meq/L (ref 96–112)
CO2: 27 mmol/L (ref 19–32)
Creatinine, Ser: 0.91 mg/dL (ref 0.50–1.10)
GFR calc Af Amer: 64 mL/min — ABNORMAL LOW (ref >90)
GFR calc non Af Amer: 55 mL/min — ABNORMAL LOW (ref >90)
Glucose, Bld: 104 mg/dL — ABNORMAL HIGH (ref 70–99)
POTASSIUM: 4.1 mmol/L (ref 3.5–5.1)
SODIUM: 135 mmol/L (ref 135–145)

## 2014-08-07 MED ORDER — LABETALOL HCL 5 MG/ML IV SOLN
20.0000 mg | INTRAVENOUS | Status: DC | PRN
  Administered 2014-08-07 (×2): 20 mg via INTRAVENOUS
  Filled 2014-08-07 (×2): qty 4

## 2014-08-07 NOTE — ED Notes (Signed)
Dr. James at bedside  

## 2014-08-07 NOTE — Discharge Instructions (Signed)
Change her losartan 50 mg in the morning, and 50 mg in the evening. Your physician this week for recheck Hypertension Hypertension, commonly called high blood pressure, is when the force of blood pumping through your arteries is too strong. Your arteries are the blood vessels that carry blood from your heart throughout your body. A blood pressure reading consists of a higher number over a lower number, such as 110/72. The higher number (systolic) is the pressure inside your arteries when your heart pumps. The lower number (diastolic) is the pressure inside your arteries when your heart relaxes. Ideally you want your blood pressure below 120/80. Hypertension forces your heart to work harder to pump blood. Your arteries may become narrow or stiff. Having hypertension puts you at risk for heart disease, stroke, and other problems.  RISK FACTORS Some risk factors for high blood pressure are controllable. Others are not.  Risk factors you cannot control include:   Race. You may be at higher risk if you are African American.  Age. Risk increases with age.  Gender. Men are at higher risk than women before age 27 years. After age 77, women are at higher risk than men. Risk factors you can control include:  Not getting enough exercise or physical activity.  Being overweight.  Getting too much fat, sugar, calories, or salt in your diet.  Drinking too much alcohol. SIGNS AND SYMPTOMS Hypertension does not usually cause signs or symptoms. Extremely high blood pressure (hypertensive crisis) may cause headache, anxiety, shortness of breath, and nosebleed. DIAGNOSIS  To check if you have hypertension, your health care provider will measure your blood pressure while you are seated, with your arm held at the level of your heart. It should be measured at least twice using the same arm. Certain conditions can cause a difference in blood pressure between your right and left arms. A blood pressure reading  that is higher than normal on one occasion does not mean that you need treatment. If one blood pressure reading is high, ask your health care provider about having it checked again. TREATMENT  Treating high blood pressure includes making lifestyle changes and possibly taking medicine. Living a healthy lifestyle can help lower high blood pressure. You may need to change some of your habits. Lifestyle changes may include:  Following the DASH diet. This diet is high in fruits, vegetables, and whole grains. It is low in salt, red meat, and added sugars.  Getting at least 2 hours of brisk physical activity every week.  Losing weight if necessary.  Not smoking.  Limiting alcoholic beverages.  Learning ways to reduce stress. If lifestyle changes are not enough to get your blood pressure under control, your health care provider may prescribe medicine. You may need to take more than one. Work closely with your health care provider to understand the risks and benefits. HOME CARE INSTRUCTIONS  Have your blood pressure rechecked as directed by your health care provider.   Take medicines only as directed by your health care provider. Follow the directions carefully. Blood pressure medicines must be taken as prescribed. The medicine does not work as well when you skip doses. Skipping doses also puts you at risk for problems.   Do not smoke.   Monitor your blood pressure at home as directed by your health care provider. SEEK MEDICAL CARE IF:   You think you are having a reaction to medicines taken.  You have recurrent headaches or feel dizzy.  You have swelling in your ankles.  You have trouble with your vision. SEEK IMMEDIATE MEDICAL CARE IF:  You develop a severe headache or confusion.  You have unusual weakness, numbness, or feel faint.  You have severe chest or abdominal pain.  You vomit repeatedly.  You have trouble breathing. MAKE SURE YOU:   Understand these  instructions.  Will watch your condition.  Will get help right away if you are not doing well or get worse. Document Released: 07/15/2005 Document Revised: 11/29/2013 Document Reviewed: 05/07/2013 [facility] Patient Information 2015 [place], [place]. This information is not intended to replace advice given to you by your health care provider. Make sure you discuss any questions you have with your health care provider.

## 2014-08-07 NOTE — ED Notes (Signed)
MD at bedside. 

## 2014-08-07 NOTE — ED Notes (Signed)
BP increasing s/p admin of Labetalol Will make EDP aware

## 2014-08-07 NOTE — ED Provider Notes (Signed)
CSN: [id number]     Arrival date & time 08/07/14  2038 History   First MD Initiated Contact with Patient 08/07/14 2141     Chief Complaint  Patient presents with  . Hypertension     HPI  Patient present for evaluation of high blood pressure. Blood pressure is present or good control. She was on Toprol, and Norvasc. She developed some dependent edema that her physician felt was likely 2/2 amlodipine. So this was stopped around December 25 by her physician's PA. She was continued simply on her metoprolol, but had some high trending blood pressures.. She states she had a one-week viral illness with vomiting and diarrhea. And then saw her physician. 2 days ago was started on losartan 50 mg daily in addition to her metoprolol. She does not feel poorly. She states she has felt "weak" since she had the viral illness but is recovering. Her main concern is the numbers of her blood pressure at home rather than symptoms. No headache, no chest pain, no shortness of breath, no dependent edema.  Past Medical History  Diagnosis Date  . Atrial fibrillation     failure to tolerate Pradaxa secondary to GI symptoms  . First degree AV block   . SVT (supraventricular tachycardia)     possible atrial tachycardia, possible junctional tacycardia  . Hypertension   . Hypothyroidism   . Hyperlipidemia   . Gastroesophageal reflux disease   . Seasonal allergies   . H/O: hysterectomy   . Hx of echocardiogram     echo 07/2009:  mild focal basal septal hypertrophy, EF 55-60%, mild MR, mod LAE, mild RAE, PASP 45   Past Surgical History  Procedure Laterality Date  . Laparoscopic bilateral salpingo-oophorectomy    . Arthroscopic knee surgery right    . Abdominal hysterectomy     History reviewed. No pertinent family history. History  Substance Use Topics  . Smoking status: Never Smoker   . Smokeless tobacco: Not on file  . Alcohol Use: No   OB History    No data available     Review of Systems   Constitutional: Negative for fever, chills, diaphoresis, appetite change and fatigue.  HENT: Negative for mouth sores, sore throat and trouble swallowing.   Eyes: Negative for visual disturbance.  Respiratory: Negative for cough, chest tightness, shortness of breath and wheezing.   Cardiovascular: Negative for chest pain.  Gastrointestinal: Negative for nausea, vomiting, abdominal pain, diarrhea and abdominal distention.  Endocrine: Negative for polydipsia, polyphagia and polyuria.  Genitourinary: Negative for dysuria, frequency and hematuria.  Musculoskeletal: Negative for gait problem.  Skin: Negative for color change, pallor and rash.  Neurological: Positive for weakness. Negative for dizziness, syncope, light-headedness and headaches.  Hematological: Does not bruise/bleed easily.  Psychiatric/Behavioral: Negative for behavioral problems and confusion.      Allergies  Dabigatran and Sulfonamide derivatives  Home Medications   Prior to Admission medications   Medication Sig Start Date End Date Taking? Authorizing Provider  levothyroxine (SYNTHROID, LEVOTHROID) 50 MCG tablet Take 50 mcg by mouth daily.     Yes Historical Provider, MD  losartan (COZAAR) 50 MG tablet Take 50 mg by mouth daily.   Yes Historical Provider, MD  metoprolol (LOPRESSOR) 100 MG tablet Take 100 mg by mouth 2 (two) times daily.   Yes Historical Provider, MD  TIKOSYN 125 MCG capsule TAKE 1 CAPSULE (125 MCG) BY MOUTH TWO TIMES DAILY AS DIRECTED. CONSULTMD BEFORE TAKING ANY NEW MEDICATIONS INCLUDING OTC/HERBAL PRODUCTS. Patient taking differently: TAKE 1  CAPSULE (125 MCG) BY MOUTH TWO TIMES DAILY. 04/19/14  Yes Deboraha Sprang, MD  warfarin (COUMADIN) 2.5 MG tablet Take 1.25-2.5 mg by mouth daily. Take 1 tablet all days except Wednesday take 0.5 tablet   Yes Historical Provider, MD  fexofenadine (ALLEGRA) 180 MG tablet Take 180 mg by mouth daily as needed for allergies.     Historical Provider, MD  metoprolol  succinate (TOPROL-XL) 100 MG 24 hr tablet TAKE 1 TABLET BY MOUTH EVERY MORNING WITH BREAKFAST. TAKE WITH OR IMMEDIATELY FOLLOWING A MEAL Patient not taking: Reported on 08/07/2014 07/01/13   Deboraha Sprang, MD  Multiple Vitamins-Minerals (VITEYES AREDS FORMULA/LUTEIN) CAPS Take by mouth daily.     Historical Provider, MD  omeprazole (PRILOSEC OTC) 20 MG tablet Take 20 mg by mouth daily as needed. For heartburn    Historical Provider, MD  warfarin (COUMADIN) 2.5 MG tablet TAKE AS DIRECTED BY ANTICOAGULATION CLINIC Patient not taking: Reported on 08/07/2014 04/28/13   Deboraha Sprang, MD   BP 156/68 mmHg  Pulse 75  Temp(Src) 98 F (36.7 C) (Oral)  Resp 19  Ht 5\' 2"  (1.575 m)  Wt 114 lb (51.71 kg)  BMI 20.85 kg/m2  SpO2 98% Physical Exam  Constitutional: She is oriented to person, place, and time. She appears well-developed and well-nourished. No distress.  Awake alert  HENT:  Head: Normocephalic.  Eyes: Conjunctivae are normal. Pupils are equal, round, and reactive to light. No scleral icterus.  Neck: Normal range of motion. Neck supple. No thyromegaly present.  Cardiovascular: Normal rate and regular rhythm.  Exam reveals no gallop and no friction rub.   No murmur heard. Pulmonary/Chest: Effort normal and breath sounds normal. No respiratory distress. She has no wheezes. She has no rales.  Abdominal: Soft. Bowel sounds are normal. She exhibits no distension. There is no tenderness. There is no rebound.  Musculoskeletal: Normal range of motion.  Neurological: She is alert and oriented to person, place, and time.  Skin: Skin is warm and dry. No rash noted.  Psychiatric: She has a normal mood and affect. Her behavior is normal.    ED Course  Procedures (including critical care time) Labs Review Labs Reviewed  CBC WITH DIFFERENTIAL - Abnormal; Notable for the following:    Hemoglobin 11.3 (*)    HCT 35.9 (*)    MCH 25.7 (*)    All other components within normal limits  BASIC METABOLIC  PANEL - Abnormal; Notable for the following:    Glucose, Bld 104 (*)    GFR calc non Af Amer 55 (*)    GFR calc Af Amer 64 (*)    All other components within normal limits    Imaging Review No results found.   EKG Interpretation None      MDM   Final diagnoses:  Essential hypertension    She has improvement of her blood pressure with one dose of labetalol. Awaiting labs. EKG without acute changes.  If her renal function is normal she can be safely discharged on her 100 mg dose of losartan with follow-up with her physician this week.  Pressures improved. Has received 2 IV doses of labetalol.plan will be: discharge home. Continue 50 mg of losartan twice a day. Follow-up with her physician this week.    Tanna Furry, MD 08/07/14 [phone]

## 2014-08-07 NOTE — ED Notes (Signed)
Pt arrived to the ED with a complaint of hypertension.  Pt states that she has had an issue since Monday last.  Pt was change medication on Friday.  Pt states she has taken an extra pill today on instructions from the doctor on call.  Pt states her blood pressure has steadily increased over the weekend.  Pt is hypertensive in triage.

## 2014-08-11 DIAGNOSIS — I10 Essential (primary) hypertension: Secondary | ICD-10-CM | POA: Diagnosis not present

## 2014-08-25 DIAGNOSIS — I10 Essential (primary) hypertension: Secondary | ICD-10-CM | POA: Diagnosis not present

## 2014-08-25 DIAGNOSIS — L0390 Cellulitis, unspecified: Secondary | ICD-10-CM | POA: Diagnosis not present

## 2014-08-26 ENCOUNTER — Telehealth: Payer: Self-pay | Admitting: Internal Medicine

## 2014-08-26 MED ORDER — LABETALOL HCL 100 MG PO TABS: 100.0000 mg | ORAL_TABLET | Freq: Two times a day (BID) | ORAL | Status: DC

## 2014-08-26 NOTE — Telephone Encounter (Signed)
Re: conversation at 7:56 pm: Per Dr. Caryl Comes: Stop Metoprolol Start Labetalol 100 BID Called patient at that time and informed her of orders. Patient verbalized understanding and very thankful for "going the extra mile with this". Rx sent to [facility]/

## 2014-08-26 NOTE — Telephone Encounter (Signed)
New message     Pt c/o BP issue:  1. What are your last 5 BP readings? 164/90 last night 2. Are you having any other symptoms (ex. Dizziness, headache, blurred vision, passed out)? dizziness 3. What is your medication issue? PCP changed pts medications and she is having trouble with her bp

## 2014-08-26 NOTE — Telephone Encounter (Signed)
She tells me she went to her PCP yesterday about BP issues.  She states he told her to contact Dr. Caryl Comes for recommendations - as he is not sure where to go from here with her BP control, but he is ok with her BP 140 or less. He stopped her Losartan and switched her to Diovan-HCT 320/12.5.  She also told me that she had a virus about a month ago in which she was so sick that for about a week she could not keep her medications down. Reported pressures are: 146/93, 176/107, 139/90, 164/107 -- she takes her BP in the evenings.   She doesn't feel good, hard to function during the day. Patient aware that I will review with Dr. Caryl Comes and call her back with his recommendations/orders.  She calls me back a few minutes later because she forgot to tell me something important -- she was also AFib yesterday when she was at PCP. He started her on Cartia XT, prn for episodes. He wanted her to inform Dr. Caryl Comes of this.

## 2014-08-26 NOTE — Telephone Encounter (Signed)
Had spoke with patient about an hour ago with orders to start Labetalol 100 BID. However, after reviewing chart patient is taking Toprol. Am waiting to hear from Dr. Caryl Comes about updated orders. Explained that I will call her over the weekend if he reviews and answers. Patient is aware that this is something that her PCP will need to follow as this is not what Dr. Caryl Comes is following her for. Patient verbalized understanding and agreeable to plan.

## 2014-08-29 ENCOUNTER — Telehealth: Payer: Self-pay | Admitting: Internal Medicine

## 2014-08-29 NOTE — Telephone Encounter (Signed)
New Message     Patient needs to speak to nurse regarding BetaBlockers (Labetalol 100mg ), please give patient a call back.

## 2014-08-29 NOTE — Telephone Encounter (Signed)
Patient concerned about stopping Metoprolol and starting Labetalol.  She took one Labetalol and thought it cause her Afib - we discussed the medication and how it was not the cause of her AFib.  She tells me her PCP recommended she give this a try (the Labetalol). I advised to try the change for 1-2 weeks before deciding if its working or not.  Patient is agreeable and will call back if has more questions/concerns.

## 2014-09-01 ENCOUNTER — Ambulatory Visit (INDEPENDENT_AMBULATORY_CARE_PROVIDER_SITE_OTHER): Payer: Medicare Other | Admitting: Internal Medicine

## 2014-09-01 ENCOUNTER — Encounter: Payer: Self-pay | Admitting: Internal Medicine

## 2014-09-01 ENCOUNTER — Telehealth: Payer: Self-pay | Admitting: Physician Assistant

## 2014-09-01 VITALS — BP 142/80 | HR 73 | Ht 62.5 in | Wt 118.0 lb

## 2014-09-01 DIAGNOSIS — I480 Paroxysmal atrial fibrillation: Secondary | ICD-10-CM

## 2014-09-01 NOTE — Progress Notes (Signed)
Patient Care Team: Tommy Medal, MD as PCP - General   HPI  [name] is a 81 y.o. female  seen in followup for paroxysmal atrial fibrillation for which she takes Tikosyn.  She comes in today as an addon with complaints of recurring tachypalpitations  She was seen by her PCP last week who told her she was in atrial fibrillation. These ECGs were obtained and reviewed to look like sinus rhythm with some sinus arrhythmia.    She also has hypertension which has been poorly controlled. Her PCP "threw up his hand since that I can't do anything" and told her to call us. We started her on labetalol.  ths has been very successful in managing her blood pressure  Her husband has died intercurrently. She is feeling some better but still remains quite exhausted. Over the last 2 days she's feeling somewhat worse.     Past Medical History  Diagnosis Date  . Atrial fibrillation     failure to tolerate Pradaxa secondary to GI symptoms  . First degree AV block   . SVT (supraventricular tachycardia)     possible atrial tachycardia, possible junctional tacycardia  . Hypertension   . Hypothyroidism   . Hyperlipidemia   . Gastroesophageal reflux disease   . Seasonal allergies   . H/O: hysterectomy   . Hx of echocardiogram     echo 07/2009:  mild focal basal septal hypertrophy, EF 55-60%, mild MR, mod LAE, mild RAE, PASP 45    Past Surgical History  Procedure Laterality Date  . Laparoscopic bilateral salpingo-oophorectomy    . Arthroscopic knee surgery right    . Abdominal hysterectomy      Current Outpatient Prescriptions  Medication Sig Dispense Refill  . diltiazem (CARDIZEM CD) 180 MG 24 hr capsule Take 180 mg by mouth daily as needed (HR).    . fexofenadine (ALLEGRA) 180 MG tablet Take 180 mg by mouth daily as needed for allergies.     [name] levothyroxine (SYNTHROID, LEVOTHROID) 50 MCG tablet Take 50 mcg by mouth daily.      . Multiple Vitamins-Minerals (VITEYES AREDS FORMULA/LUTEIN) CAPS  Take 1 capsule by mouth daily.     [name] omeprazole (PRILOSEC OTC) 20 MG tablet Take 20 mg by mouth daily as needed. For heartburn    . TIKOSYN 125 MCG capsule TAKE 1 CAPSULE (125 MCG) BY MOUTH TWO TIMES DAILY AS DIRECTED. CONSULTMD BEFORE TAKING ANY NEW MEDICATIONS INCLUDING OTC/HERBAL PRODUCTS. (Patient taking differently: TAKE 1 CAPSULE (125 MCG) BY MOUTH TWO TIMES DAILY.) 180 capsule 1  . valsartan-hydrochlorothiazide (DIOVAN-HCT) 320-12.5 MG per tablet Take 1 tablet by mouth daily.    [name] warfarin (COUMADIN) 2.5 MG tablet Take 1.25-2.5 mg by mouth daily. Take 1 tablet all days except Wednesday take 0.5 tablet    . labetalol (NORMODYNE) 100 MG tablet Take 1 tablet (100 mg total) by mouth 2 (two) times daily. (Patient not taking: Reported on 09/01/2014) 60 tablet 1  . [DISCONTINUED] pantoprazole (PROTONIX) 20 MG tablet Take 1 tablet (20 mg total) by mouth daily. 30 tablet 11  . [DISCONTINUED] pravastatin (PRAVACHOL) 80 MG tablet Take 40 mg by mouth Daily.     . [DISCONTINUED] spironolactone (ALDACTONE) 25 MG tablet Take 1/2 tablet by mouth once daily     No current facility-administered medications for this visit.    Allergies  Allergen Reactions  . Dabigatran     unknown  . Sulfonamide Derivatives     REACTION: hives    Review of Systems negative  except from HPI and PMH  PE BP 142/80 mmHg  Pulse 73  Ht 5' 2.5" (1.588 m)  Wt 118 lb (53.524 kg)  BMI 21.22 kg/m2 Well developed and well nourished in no acute distress HENT normal E scleral and icterus clear Neck Supple JVP flat; carotids brisk and full Clear to ausculation  Regular rate and rhythm, no murmurs gallops or rub Soft with active bowel sounds No clubbing cyanosis none Edema Alert and oriented, grossly normal motor and sensory function Skin Warm and Dry  ECG sinus rhythm at 73 Intervals 22/08/43 with a QTC of 45  Assessment and  Plan  Atrial fibrillation-paroxysmal  Hypertensive urgency  Fatigue  We'll continue  her on her dofetilide. Surveillance laboratories when they were taken by her PCP yesterday; I have reviewed with the daughter appropriate target levels.  Her blood pressure is much improved on labetalol. We will continue 100 mg twice daily.  We reviewed her ECGs and together disease from her medical. There is sinus arrhythmia/PACs but no evidence of atrial fibrillation. I suggested to her daughter the [employer] Monitor to try to clarify the presence or absence of atrial fibrillation area she was given diltiazem by her PCP; she will use it as needed for heart rate persists above 100 bpm.   We spent more than 50% of our >40 min visit in face to face counseling regarding the above

## 2014-09-01 NOTE — Telephone Encounter (Signed)
Received a call from patient stating she had been in atrial fibrillation all night.   When it started last night, she took a Cartia, 180 mg. Her heart rate has improved, but she is still in atrial fibrillation. Her heart rate has been as low as 50 and as high as 106. Currently she is in the 80s. Her systolic blood pressure is greater than 150.   She wants to be back in sinus rhythm.  Advised her that it was okay to take her Tikosyn a little early. Advised her that since she is not feeling like she is going to pass out and is not in danger of falling, she does not have to come to the emergency room.   Advised her that the office would call her with an appointment and a decision could be made at that time if she needed to come to the emergency room or if this could be managed as an outpatient.   She is in agreement with this as a plan of care.  She feels that she has been in/out of atrial fibrillation since being started on the labetalol and does not wish to take this. She will take her losartan today.

## 2014-09-01 NOTE — Patient Instructions (Signed)
Your physician recommends that you continue on your current medications as directed. Please refer to the Current Medication list given to you today.  Your physician recommends that you schedule a follow-up appointment in: April with Dr. Caryl Comes.

## 2014-09-05 DIAGNOSIS — I10 Essential (primary) hypertension: Secondary | ICD-10-CM | POA: Diagnosis not present

## 2014-09-05 DIAGNOSIS — I4891 Unspecified atrial fibrillation: Secondary | ICD-10-CM | POA: Diagnosis not present

## 2014-09-05 DIAGNOSIS — Z7901 Long term (current) use of anticoagulants: Secondary | ICD-10-CM | POA: Diagnosis not present

## 2014-09-07 ENCOUNTER — Telehealth: Payer: Self-pay | Admitting: Internal Medicine

## 2014-09-07 NOTE — Telephone Encounter (Signed)
New message    Patient calling was in the office last Thursday.   Pt c/o BP issue: STAT if pt c/o blurred vision, one-sided weakness or slurred speech  1. What are your last 5 BP readings? 177/97 - 15 min ago . 180/105 - last night. 141/89- yesterday. Hour later on yesterday 140/81.    2. Are you having any other symptoms (ex. Dizziness, headache, blurred vision, passed out)?  Sick to stomach. Bowel movement often.    3. What is your BP issue?  No feeling well. Going on for a week.

## 2014-09-07 NOTE — Telephone Encounter (Signed)
Patient called in about her heart.  She states that it is "out of rhythm again", "it's jumping around", "it will go up and then back down".  She explains HR from 80-101, highest 124 but then right back down.  She and I reviewed office note from last week and discussion that her and her dtr had with Dr. Caryl Comes. We discussed how she thought she was out of rhythm last week and we worked her in - but ECG not showing AFib.  I reminded her that Alive[employer] monitor was suggested and recommended that she discuss this with her dtr and check into compatibility with her cell phone. Patient states that she will call her dtr to check on this.  I told her to call back if this would not work with her cell phone and I would discuss with doctor about ordering holter/event monitor.  She is agreeable to this. I also advised her to contact PCP again about continuing BP issues. She states that she has an appt Tuesday with him. I suggested she call their office with her current numbers to see if they have suggestions until OV next week.  She is agreeable.

## 2014-09-13 DIAGNOSIS — R21 Rash and other nonspecific skin eruption: Secondary | ICD-10-CM | POA: Diagnosis not present

## 2014-09-13 DIAGNOSIS — I10 Essential (primary) hypertension: Secondary | ICD-10-CM | POA: Diagnosis not present

## 2014-09-13 DIAGNOSIS — I480 Paroxysmal atrial fibrillation: Secondary | ICD-10-CM | POA: Diagnosis not present

## 2014-09-13 DIAGNOSIS — L570 Actinic keratosis: Secondary | ICD-10-CM | POA: Diagnosis not present

## 2014-09-13 DIAGNOSIS — C44722 Squamous cell carcinoma of skin of right lower limb, including hip: Secondary | ICD-10-CM | POA: Diagnosis not present

## 2014-09-14 NOTE — Telephone Encounter (Signed)
Checked up on patient who tells me she is doing better since BP more controlled.  She was advised, by Dr. Caryl Comes, that if Afib occurs:  Wait for up to 12 hours prior to going to ER, or is she has severe symptoms. She verbalized understanding of instructions.

## 2014-09-15 ENCOUNTER — Encounter: Payer: Self-pay | Admitting: Internal Medicine

## 2014-09-16 ENCOUNTER — Telehealth: Payer: Self-pay | Admitting: Internal Medicine

## 2014-09-16 NOTE — Telephone Encounter (Signed)
Pt's dtr calling to tell us pt is doing well.  Gave dtr same instructions as 2/10 telephone conversation. Dtr verbalized understanding and grateful/thankful for all of my help.

## 2014-09-16 NOTE — Telephone Encounter (Signed)
New message      Calling to clarify your call and instructions you gave pt--(please tell daughter what you told the patient)

## 2014-09-16 NOTE — Telephone Encounter (Signed)
Follow up      Calling to talk to [facility]

## 2014-09-22 ENCOUNTER — Other Ambulatory Visit: Payer: Self-pay | Admitting: Internal Medicine

## 2014-09-22 DIAGNOSIS — C44722 Squamous cell carcinoma of skin of right lower limb, including hip: Secondary | ICD-10-CM | POA: Diagnosis not present

## 2014-09-22 MED ORDER — LABETALOL HCL 100 MG PO TABS: 100.0000 mg | ORAL_TABLET | Freq: Two times a day (BID) | ORAL | Status: DC

## 2014-09-27 ENCOUNTER — Ambulatory Visit: Payer: Self-pay | Admitting: Internal Medicine

## 2014-10-04 DIAGNOSIS — N390 Urinary tract infection, site not specified: Secondary | ICD-10-CM | POA: Diagnosis not present

## 2014-10-11 DIAGNOSIS — I4891 Unspecified atrial fibrillation: Secondary | ICD-10-CM | POA: Diagnosis not present

## 2014-10-11 DIAGNOSIS — Z7901 Long term (current) use of anticoagulants: Secondary | ICD-10-CM | POA: Diagnosis not present

## 2014-10-13 DIAGNOSIS — C44722 Squamous cell carcinoma of skin of right lower limb, including hip: Secondary | ICD-10-CM | POA: Diagnosis not present

## 2014-10-14 ENCOUNTER — Encounter: Payer: Self-pay | Admitting: Internal Medicine

## 2014-10-18 ENCOUNTER — Telehealth: Payer: Self-pay | Admitting: Internal Medicine

## 2014-10-18 ENCOUNTER — Other Ambulatory Visit: Payer: Self-pay

## 2014-10-18 MED ORDER — DOFETILIDE 125 MCG PO CAPS: ORAL_CAPSULE | ORAL | Status: DC

## 2014-10-18 NOTE — Telephone Encounter (Signed)
error 

## 2014-10-19 ENCOUNTER — Other Ambulatory Visit: Payer: Self-pay | Admitting: *Deleted

## 2014-10-19 MED ORDER — DOFETILIDE 125 MCG PO CAPS: ORAL_CAPSULE | ORAL | Status: DC

## 2014-10-20 DIAGNOSIS — C44722 Squamous cell carcinoma of skin of right lower limb, including hip: Secondary | ICD-10-CM | POA: Diagnosis not present

## 2014-10-20 DIAGNOSIS — L821 Other seborrheic keratosis: Secondary | ICD-10-CM | POA: Diagnosis not present

## 2014-10-20 DIAGNOSIS — D225 Melanocytic nevi of trunk: Secondary | ICD-10-CM | POA: Diagnosis not present

## 2014-10-20 DIAGNOSIS — L570 Actinic keratosis: Secondary | ICD-10-CM | POA: Diagnosis not present

## 2014-10-20 DIAGNOSIS — D485 Neoplasm of uncertain behavior of skin: Secondary | ICD-10-CM | POA: Diagnosis not present

## 2014-10-20 DIAGNOSIS — Z85828 Personal history of other malignant neoplasm of skin: Secondary | ICD-10-CM | POA: Diagnosis not present

## 2014-10-21 DIAGNOSIS — C4442 Squamous cell carcinoma of skin of scalp and neck: Secondary | ICD-10-CM | POA: Diagnosis not present

## 2014-10-21 DIAGNOSIS — D0439 Carcinoma in situ of skin of other parts of face: Secondary | ICD-10-CM | POA: Diagnosis not present

## 2014-10-25 ENCOUNTER — Encounter: Payer: Self-pay | Admitting: Internal Medicine

## 2014-10-25 ENCOUNTER — Ambulatory Visit (INDEPENDENT_AMBULATORY_CARE_PROVIDER_SITE_OTHER): Payer: Medicare Other | Admitting: Internal Medicine

## 2014-10-25 VITALS — BP 178/100 | HR 90 | Ht 62.5 in | Wt 118.4 lb

## 2014-10-25 DIAGNOSIS — I480 Paroxysmal atrial fibrillation: Secondary | ICD-10-CM

## 2014-10-25 MED ORDER — LABETALOL HCL 200 MG PO TABS: 200.0000 mg | ORAL_TABLET | Freq: Two times a day (BID) | ORAL | Status: DC

## 2014-10-25 MED ORDER — VALSARTAN-HYDROCHLOROTHIAZIDE 160-12.5 MG PO TABS: 1.0000 | ORAL_TABLET | Freq: Every day | ORAL | Status: DC

## 2014-10-25 NOTE — Patient Instructions (Signed)
Your physician has recommended you make the following change in your medication:  1) INCREASE Labetalol to 200 mg twice daily 2) DECREASE Valsartan-Hydrochlorothiazide to 160/12.5 mg  Daily  Your physician recommends that you return for lab work for : TSH, CMET, CBCD, Magnesium  Your physician recommends that you schedule a follow-up appointment in: 10-12 weeks with Dr. Caryl Comes.

## 2014-10-25 NOTE — Progress Notes (Addendum)
Patient Care Team: Tommy Medal, MD as PCP - General   HPI  [name] is a 81 y.o. female  seen in followup for paroxysmal atrial fibrillation for which she takes Tikosyn.  She comes in today as an addon with complaints of recurring tachypalpitations  She was seen by her PCP last week who told her she was in atrial fibrillation. These ECGs were obtained and reviewed to look like sinus rhythm with some sinus arrhythmia.    She also has hypertension which has been poorly controlled. Her PCP "threw up his hand since that I can't do anything" and told her to call us. We started her on labetalol. This had been quite successful in controlling her blood pressure; however, of late it has been much more difficult to control for reasons that are not clear.         Past Medical History  Diagnosis Date  . Atrial fibrillation     failure to tolerate Pradaxa secondary to GI symptoms  . First degree AV block   . SVT (supraventricular tachycardia)     possible atrial tachycardia, possible junctional tacycardia  . Hypertension   . Hypothyroidism   . Hyperlipidemia   . Gastroesophageal reflux disease   . Seasonal allergies   . H/O: hysterectomy   . Hx of echocardiogram     echo 07/2009:  mild focal basal septal hypertrophy, EF 55-60%, mild MR, mod LAE, mild RAE, PASP 45    Past Surgical History  Procedure Laterality Date  . Laparoscopic bilateral salpingo-oophorectomy    . Arthroscopic knee surgery right    . Abdominal hysterectomy      Current Outpatient Prescriptions  Medication Sig Dispense Refill  . diltiazem (CARDIZEM CD) 180 MG 24 hr capsule Take 180 mg by mouth daily as needed (HR).    . dofetilide (TIKOSYN) 125 MCG capsule TAKE 1 CAPSULE (125 MCG) BY MOUTH TWO TIMES DAILY AS DIRECTED. CONSULTMD BEFORE TAKING ANY NEW MEDICATIONS INCLUDING OTC/HERBAL PRODUCTS. 180 capsule 1  . fexofenadine (ALLEGRA) 180 MG tablet Take 180 mg by mouth daily as needed for allergies.     [name]  labetalol (NORMODYNE) 200 MG tablet Take 1 tablet (200 mg total) by mouth 2 (two) times daily. 60 tablet 2  . levothyroxine (SYNTHROID, LEVOTHROID) 50 MCG tablet Take 50 mcg by mouth daily.      . Multiple Vitamins-Minerals (VITEYES AREDS FORMULA/LUTEIN) CAPS Take 1 capsule by mouth daily.     [name] omeprazole (PRILOSEC OTC) 20 MG tablet Take 20 mg by mouth daily as needed. For heartburn    . warfarin (COUMADIN) 2.5 MG tablet Take 1.25-2.5 mg by mouth daily. Take 1 tablet all days except Wednesday take 0.5 tablet    . valsartan-hydrochlorothiazide (DIOVAN HCT) 160-12.5 MG per tablet Take 1 tablet by mouth daily. 30 tablet 3  . [DISCONTINUED] pantoprazole (PROTONIX) 20 MG tablet Take 1 tablet (20 mg total) by mouth daily. 30 tablet 11  . [DISCONTINUED] pravastatin (PRAVACHOL) 80 MG tablet Take 40 mg by mouth Daily.     . [DISCONTINUED] spironolactone (ALDACTONE) 25 MG tablet Take 1/2 tablet by mouth once daily     No current facility-administered medications for this visit.    Allergies  Allergen Reactions  . Dabigatran     unknown  . Sulfonamide Derivatives     REACTION: hives    Review of Systems negative except from HPI and PMH  PE BP 178/100 mmHg  Pulse 90  Ht 5' 2.5" (1.588 m)  [facility]  118 lb 6.4 oz (53.706 kg)  BMI 21.30 kg/m2 Well developed and well nourished in no acute distress HENT normal E scleral and icterus clear Neck Supple JVP flat; carotids brisk and full Clear to ausculation  Regular rate and rhythm, no murmurs gallops or rub Soft with active bowel sounds No clubbing cyanosis none Edema Alert and oriented, grossly normal motor and sensory function Skin Warm and Dry Repeat blood pressure was 182 palp   ECG  was ordered today and demonstrates what I think is an atrial tachycardia at a rate of about 100 bpm intervals are 0.20/0.08/0.34 the P wave morphology is monophasic upright in lead V1.  Assessment and  Plan  Atrial fibrillation-paroxysmal  Atrial  tachycardia  Hypertensive urgency  Fatigue  The data that she brings him home suggests that the atrial tachycardia and/or atrial fibrillation has been more problematic of late. We will check for secondary causes including checking a TSH and hemoglobin the latter was mildly depressed in January compared to September  We'll continue her on her dofetilide. Surveillance laboratories when they were taken by her PCP yesterday; I have reviewed with the daughter appropriate target levels.  Her blood pressure has been poorly controlled of late. We will increase her labetalol from 100--200 mg twice daily.  This may help [place] further in controlling her heart rates which I suspect are related to an atrial tachycardia.[name]

## 2014-10-28 ENCOUNTER — Other Ambulatory Visit: Payer: BLUE CROSS/BLUE SHIELD

## 2014-10-31 ENCOUNTER — Other Ambulatory Visit: Payer: BLUE CROSS/BLUE SHIELD

## 2014-11-02 ENCOUNTER — Other Ambulatory Visit (INDEPENDENT_AMBULATORY_CARE_PROVIDER_SITE_OTHER): Payer: Medicare Other | Admitting: *Deleted

## 2014-11-02 DIAGNOSIS — I480 Paroxysmal atrial fibrillation: Secondary | ICD-10-CM

## 2014-11-02 LAB — CBC WITH DIFFERENTIAL/PLATELET
Basophils Absolute: 0 10*3/uL (ref 0.0–0.1)
Basophils Relative: 0.6 % (ref 0.0–3.0)
EOS PCT: 1.6 % (ref 0.0–5.0)
Eosinophils Absolute: 0.1 10*3/uL (ref 0.0–0.7)
HCT: 33.1 % — ABNORMAL LOW (ref 36.0–46.0)
HEMOGLOBIN: 11.3 g/dL — AB (ref 12.0–15.0)
Lymphocytes Relative: 16 % (ref 12.0–46.0)
Lymphs Abs: 0.9 10*3/uL (ref 0.7–4.0)
MCHC: 34.1 g/dL (ref 30.0–36.0)
MCV: 76.9 fl — AB (ref 78.0–100.0)
MONOS PCT: 7.7 % (ref 3.0–12.0)
Monocytes Absolute: 0.4 10*3/uL (ref 0.1–1.0)
Neutro Abs: 4.1 10*3/uL (ref 1.4–7.7)
Neutrophils Relative %: 74.1 % (ref 43.0–77.0)
Platelets: 254 10*3/uL (ref 150.0–400.0)
RBC: 4.3 Mil/uL (ref 3.87–5.11)
RDW: 14 % (ref 11.5–15.5)
WBC: 5.5 10*3/uL (ref 4.0–10.5)

## 2014-11-02 LAB — MAGNESIUM: Magnesium: 1.9 mg/dL (ref 1.5–2.5)

## 2014-11-02 LAB — COMPREHENSIVE METABOLIC PANEL
ALT: 21 U/L (ref 0–35)
AST: 20 U/L (ref 0–37)
Albumin: 4 g/dL (ref 3.5–5.2)
Alkaline Phosphatase: 78 U/L (ref 39–117)
BILIRUBIN TOTAL: 0.3 mg/dL (ref 0.2–1.2)
BUN: 24 mg/dL — AB (ref 6–23)
CO2: 28 meq/L (ref 19–32)
CREATININE: 1.11 mg/dL (ref 0.40–1.20)
Calcium: 9.7 mg/dL (ref 8.4–10.5)
Chloride: 99 mEq/L (ref 96–112)
GFR: 49.32 mL/min — AB (ref >60.00)
GLUCOSE: 104 mg/dL — AB (ref 70–99)
POTASSIUM: 4.3 meq/L (ref 3.5–5.1)
Sodium: 134 mEq/L — ABNORMAL LOW (ref 135–145)
Total Protein: 6.9 g/dL (ref 6.0–8.3)

## 2014-11-02 LAB — TSH: TSH: 0.94 u[IU]/mL (ref 0.35–4.50)

## 2014-11-02 NOTE — Addendum Note (Signed)
Addended by: Eulis Foster on: 11/02/2014 01:50 PM   Modules accepted: Orders

## 2014-11-03 ENCOUNTER — Ambulatory Visit: Payer: Medicare Other | Admitting: Internal Medicine

## 2014-11-03 ENCOUNTER — Other Ambulatory Visit: Payer: Self-pay | Admitting: *Deleted

## 2014-11-03 MED ORDER — VALSARTAN-HYDROCHLOROTHIAZIDE 160-12.5 MG PO TABS: 1.0000 | ORAL_TABLET | Freq: Every day | ORAL | Status: DC

## 2014-11-15 DIAGNOSIS — I4891 Unspecified atrial fibrillation: Secondary | ICD-10-CM | POA: Diagnosis not present

## 2014-11-15 DIAGNOSIS — Z7901 Long term (current) use of anticoagulants: Secondary | ICD-10-CM | POA: Diagnosis not present

## 2014-11-17 ENCOUNTER — Telehealth: Payer: Self-pay | Admitting: Internal Medicine

## 2014-11-17 NOTE — Telephone Encounter (Signed)
Patient wanted to refill on Diovan sent to [facility].  I explained that this refill had already been sent in on 4/7 with refills. She verbalized understanding and thanked me. I told her to call back if [facility] did not receive this rx.

## 2014-11-17 NOTE — Telephone Encounter (Signed)
New Message   Patient needs the nurse to order her medicines. She was told to call when she was ready. She goes to [facility] on [address].   Please give patient  A call.

## 2014-12-02 DIAGNOSIS — C4442 Squamous cell carcinoma of skin of scalp and neck: Secondary | ICD-10-CM | POA: Diagnosis not present

## 2014-12-02 DIAGNOSIS — D0439 Carcinoma in situ of skin of other parts of face: Secondary | ICD-10-CM | POA: Diagnosis not present

## 2014-12-02 DIAGNOSIS — L905 Scar conditions and fibrosis of skin: Secondary | ICD-10-CM | POA: Diagnosis not present

## 2014-12-12 DIAGNOSIS — H40013 Open angle with borderline findings, low risk, bilateral: Secondary | ICD-10-CM | POA: Diagnosis not present

## 2014-12-12 DIAGNOSIS — H3531 Nonexudative age-related macular degeneration: Secondary | ICD-10-CM | POA: Diagnosis not present

## 2014-12-12 DIAGNOSIS — H04123 Dry eye syndrome of bilateral lacrimal glands: Secondary | ICD-10-CM | POA: Diagnosis not present

## 2014-12-12 DIAGNOSIS — Z961 Presence of intraocular lens: Secondary | ICD-10-CM | POA: Diagnosis not present

## 2014-12-19 DIAGNOSIS — C4442 Squamous cell carcinoma of skin of scalp and neck: Secondary | ICD-10-CM | POA: Diagnosis not present

## 2014-12-19 DIAGNOSIS — L570 Actinic keratosis: Secondary | ICD-10-CM | POA: Diagnosis not present

## 2014-12-19 DIAGNOSIS — D0439 Carcinoma in situ of skin of other parts of face: Secondary | ICD-10-CM | POA: Diagnosis not present

## 2014-12-19 DIAGNOSIS — L821 Other seborrheic keratosis: Secondary | ICD-10-CM | POA: Diagnosis not present

## 2014-12-27 DIAGNOSIS — Z7901 Long term (current) use of anticoagulants: Secondary | ICD-10-CM | POA: Diagnosis not present

## 2014-12-27 DIAGNOSIS — I4891 Unspecified atrial fibrillation: Secondary | ICD-10-CM | POA: Diagnosis not present

## 2015-01-04 ENCOUNTER — Encounter: Payer: Self-pay | Admitting: Internal Medicine

## 2015-01-04 ENCOUNTER — Ambulatory Visit (INDEPENDENT_AMBULATORY_CARE_PROVIDER_SITE_OTHER): Payer: Medicare Other | Admitting: Internal Medicine

## 2015-01-04 VITALS — BP 120/70 | HR 84 | Ht 62.0 in | Wt 118.8 lb

## 2015-01-04 DIAGNOSIS — Z01812 Encounter for preprocedural laboratory examination: Secondary | ICD-10-CM

## 2015-01-04 DIAGNOSIS — I480 Paroxysmal atrial fibrillation: Secondary | ICD-10-CM

## 2015-01-04 LAB — BASIC METABOLIC PANEL
BUN: 25 mg/dL — ABNORMAL HIGH (ref 6–23)
CO2: 30 mEq/L (ref 19–32)
Calcium: 9.4 mg/dL (ref 8.4–10.5)
Chloride: 97 mEq/L (ref 96–112)
Creatinine, Ser: 1.08 mg/dL (ref 0.40–1.20)
GFR: 50.88 mL/min — ABNORMAL LOW (ref >60.00)
Glucose, Bld: 100 mg/dL — ABNORMAL HIGH (ref 70–99)
Potassium: 4.8 mEq/L (ref 3.5–5.1)
SODIUM: 131 meq/L — AB (ref 135–145)

## 2015-01-04 LAB — CBC WITH DIFFERENTIAL/PLATELET
Basophils Absolute: 0 10*3/uL (ref 0.0–0.1)
Basophils Relative: 0.8 % (ref 0.0–3.0)
Eosinophils Absolute: 0.1 10*3/uL (ref 0.0–0.7)
Eosinophils Relative: 1.9 % (ref 0.0–5.0)
HEMATOCRIT: 32.3 % — AB (ref 36.0–46.0)
Hemoglobin: 10.8 g/dL — ABNORMAL LOW (ref 12.0–15.0)
LYMPHS ABS: 1.3 10*3/uL (ref 0.7–4.0)
Lymphocytes Relative: 23.5 % (ref 12.0–46.0)
MCHC: 33.4 g/dL (ref 30.0–36.0)
MCV: 78.2 fl (ref 78.0–100.0)
MONO ABS: 0.6 10*3/uL (ref 0.1–1.0)
Monocytes Relative: 9.6 % (ref 3.0–12.0)
NEUTROS PCT: 64.2 % (ref 43.0–77.0)
Neutro Abs: 3.7 10*3/uL (ref 1.4–7.7)
Platelets: 232 10*3/uL (ref 150.0–400.0)
RBC: 4.13 Mil/uL (ref 3.87–5.11)
RDW: 14.8 % (ref 11.5–15.5)
WBC: 5.7 10*3/uL (ref 4.0–10.5)

## 2015-01-04 MED ORDER — VALSARTAN-HYDROCHLOROTHIAZIDE 80-12.5 MG PO TABS: 1.0000 | ORAL_TABLET | Freq: Every day | ORAL | Status: DC

## 2015-01-04 NOTE — Patient Instructions (Addendum)
Medication Instructions:  Your physician has recommended you make the following change in your medication:  1) DECREASE Valsartan-Hydrochlorothiazide to 80/12.5  Labwork: Pre procedure labs today: BMET & CBCD  Testing/Procedures: Your physician has recommended that you have a Cardioversion (DCCV). Electrical Cardioversion uses a jolt of electricity to your heart either through paddles or wired patches attached to your chest. This is a controlled, usually prescheduled, procedure. Defibrillation is done under light anesthesia in the hospital, and you usually go home the day of the procedure. This is done to get your heart back into a normal rhythm. You are not awake for the procedure. Please see the instruction sheet given to you today.  Taejah Ohalloran, RN will call you to arrange this procedure.  Follow-Up: To be determined after cardioversion  Thank you for choosing [facility]!!

## 2015-01-04 NOTE — Progress Notes (Signed)
Patient Care Team: Tommy Medal, MD as PCP - General   HPI  [name] is a 81 y.o. female  seen in followup for paroxysmal atrial fibrillation for which she takes Tikosyn.  She comes in today as an addon with complaints of recurring tachypalpitations  She was seen by her PCP last week who told her she was in atrial fibrillation. These ECGs were obtained and reviewed to look like sinus rhythm with some sinus arrhythmia.  Her blood pressures have been much improved and her edema has resolved.. She has however still had problems with fatigue. She has occasional palpitations.     Past Medical History  Diagnosis Date  . Atrial fibrillation     failure to tolerate Pradaxa secondary to GI symptoms  . First degree AV block   . SVT (supraventricular tachycardia)     possible atrial tachycardia, possible junctional tacycardia  . Hypertension   . Hypothyroidism   . Hyperlipidemia   . Gastroesophageal reflux disease   . Seasonal allergies   . H/O: hysterectomy   . Hx of echocardiogram     echo 07/2009:  mild focal basal septal hypertrophy, EF 55-60%, mild MR, mod LAE, mild RAE, PASP 45    Past Surgical History  Procedure Laterality Date  . Laparoscopic bilateral salpingo-oophorectomy    . Arthroscopic knee surgery right    . Abdominal hysterectomy      Current Outpatient Prescriptions  Medication Sig Dispense Refill  . diltiazem (CARDIZEM CD) 180 MG 24 hr capsule Take 180 mg by mouth daily as needed (HR).    . dofetilide (TIKOSYN) 125 MCG capsule TAKE 1 CAPSULE (125 MCG) BY MOUTH TWO TIMES DAILY AS DIRECTED. CONSULTMD BEFORE TAKING ANY NEW MEDICATIONS INCLUDING OTC/HERBAL PRODUCTS. 180 capsule 1  . fexofenadine (ALLEGRA) 180 MG tablet Take 180 mg by mouth daily as needed for allergies.     [name] labetalol (NORMODYNE) 200 MG tablet Take 1 tablet (200 mg total) by mouth 2 (two) times daily. 60 tablet 2  . levothyroxine (SYNTHROID, LEVOTHROID) 50 MCG tablet Take 50 mcg by mouth daily.       . Multiple Vitamins-Minerals (VITEYES AREDS FORMULA/LUTEIN) CAPS Take 1 capsule by mouth daily.     [name] omeprazole (PRILOSEC OTC) 20 MG tablet Take 20 mg by mouth daily as needed. For heartburn    . valsartan-hydrochlorothiazide (DIOVAN HCT) 160-12.5 MG per tablet Take 1 tablet by mouth daily. 30 tablet 3  . warfarin (COUMADIN) 2.5 MG tablet Take 1.25-2.5 mg by mouth daily. Take 1 tablet all days except Wednesday take 0.5 tablet    . [DISCONTINUED] pantoprazole (PROTONIX) 20 MG tablet Take 1 tablet (20 mg total) by mouth daily. 30 tablet 11  . [DISCONTINUED] pravastatin (PRAVACHOL) 80 MG tablet Take 40 mg by mouth Daily.     . [DISCONTINUED] spironolactone (ALDACTONE) 25 MG tablet Take 1/2 tablet by mouth once daily     No current facility-administered medications for this visit.    Allergies  Allergen Reactions  . Dabigatran     Unknown, per pt   . Sulfonamide Derivatives     REACTION: hives    Review of Systems negative except from HPI and PMH  PE BP 120/70 mmHg  Pulse 84  Ht 5\' 2"  (1.575 m)  Wt 118 lb 12.8 oz (53.887 kg)  BMI 21.72 kg/m2 Well developed and well nourished in no acute distress HENT normal E scleral and icterus clear Neck Supple JVP flat; carotids brisk and full Clear to ausculation Regular  and reasonably rapid r ate and rhythm, no murmurs gallops or rub Soft with active bowel sounds No clubbing cyanosis n Edema Alert and oriented, grossly normal motor and sensory function Skin Warm and Dry Repeat blood pressure was 182 palp   ECG  was ordered today and demonstrated atrial tachycardia with: 1 AV conductionAssessment and  Plan  Atrial fibrillation-paroxysmal  Atrial tachycardia  Hypertensive urgency  Fatigue   Her blood pressure is much improved and her palpitations likewise in the context of labetalol therapy. Her 12-lead demonstrates atrial tachycardia 2 to one and either this or her relatively low blood pressure may be contributing to her  fatigue. As a consequence, we will decrease her valsartan HCTZ 160/12-1/2-80/6.25 in about 10 days to 2 weeks she will let us know what her heart rates are doing and what her blood pressure is doing.   Given her atrial tachycardia we will undertake cardioversion I suspect the 2 aforementioned interventions will be associated with improved functional status

## 2015-01-05 ENCOUNTER — Telehealth: Payer: Self-pay | Admitting: *Deleted

## 2015-01-05 DIAGNOSIS — Z01812 Encounter for preprocedural laboratory examination: Secondary | ICD-10-CM

## 2015-01-05 NOTE — Telephone Encounter (Signed)
Informed patient that she will need an INR check before DCCV as 1 out of her last 3 were below normal. Per Dr. Wilmon Pali office last 3 INRs are as follows: 3/15 - 1.8 4/19 - 2.4 5/31 - 2.7  She agreed to stop by Monday for INR check and then we will talk Tuesday about procedure/scheduling. (she has been scheduled for next Friday)

## 2015-01-09 ENCOUNTER — Other Ambulatory Visit: Payer: Medicare Other

## 2015-01-09 DIAGNOSIS — I4891 Unspecified atrial fibrillation: Secondary | ICD-10-CM | POA: Diagnosis not present

## 2015-01-09 DIAGNOSIS — Z7901 Long term (current) use of anticoagulants: Secondary | ICD-10-CM | POA: Diagnosis not present

## 2015-01-09 LAB — PROTIME-INR: INR: 2.4 — AB (ref 0.9–1.1)

## 2015-01-10 ENCOUNTER — Telehealth: Payer: Self-pay | Admitting: Internal Medicine

## 2015-01-10 NOTE — Telephone Encounter (Signed)
New message     Pt is due to have a cardioversion Friday.  She has questions.  Please call

## 2015-01-10 NOTE — Telephone Encounter (Signed)
Called patient about cardioversion on Friday and went over instructions for procedure. Patient had lab work done. Patient states her last INR was 2.4. Patient understood instructions and encourage to call office with any other questions.

## 2015-01-13 ENCOUNTER — Ambulatory Visit (HOSPITAL_COMMUNITY): Payer: Medicare Other | Admitting: Anesthesiology

## 2015-01-13 ENCOUNTER — Observation Stay (HOSPITAL_COMMUNITY)
Admission: RE | Admit: 2015-01-13 | Discharge: 2015-01-14 | Disposition: A | Discharge status: Home / Self Care | Payer: Medicare Other | Source: Ambulatory Visit | Attending: Internal Medicine | Admitting: Internal Medicine

## 2015-01-13 ENCOUNTER — Encounter (HOSPITAL_COMMUNITY)
Admission: RE | Disposition: A | Discharge status: Home / Self Care | Payer: Self-pay | Source: Ambulatory Visit | Attending: Internal Medicine

## 2015-01-13 ENCOUNTER — Encounter (HOSPITAL_COMMUNITY): Payer: Self-pay | Admitting: *Deleted

## 2015-01-13 ENCOUNTER — Other Ambulatory Visit (INDEPENDENT_AMBULATORY_CARE_PROVIDER_SITE_OTHER): Payer: Medicare Other | Admitting: *Deleted

## 2015-01-13 ENCOUNTER — Telehealth: Payer: Self-pay | Admitting: *Deleted

## 2015-01-13 ENCOUNTER — Encounter: Payer: Self-pay | Admitting: Cardiovascular Disease

## 2015-01-13 DIAGNOSIS — I4891 Unspecified atrial fibrillation: Secondary | ICD-10-CM | POA: Diagnosis present

## 2015-01-13 DIAGNOSIS — Z5181 Encounter for therapeutic drug level monitoring: Secondary | ICD-10-CM | POA: Diagnosis not present

## 2015-01-13 DIAGNOSIS — Z01812 Encounter for preprocedural laboratory examination: Secondary | ICD-10-CM | POA: Diagnosis not present

## 2015-01-13 DIAGNOSIS — E785 Hyperlipidemia, unspecified: Secondary | ICD-10-CM | POA: Insufficient documentation

## 2015-01-13 DIAGNOSIS — E039 Hypothyroidism, unspecified: Secondary | ICD-10-CM | POA: Insufficient documentation

## 2015-01-13 DIAGNOSIS — I10 Essential (primary) hypertension: Secondary | ICD-10-CM | POA: Diagnosis present

## 2015-01-13 DIAGNOSIS — K219 Gastro-esophageal reflux disease without esophagitis: Secondary | ICD-10-CM | POA: Diagnosis not present

## 2015-01-13 DIAGNOSIS — Z79899 Other long term (current) drug therapy: Secondary | ICD-10-CM | POA: Insufficient documentation

## 2015-01-13 DIAGNOSIS — Z515 Encounter for palliative care: Secondary | ICD-10-CM | POA: Insufficient documentation

## 2015-01-13 DIAGNOSIS — I480 Paroxysmal atrial fibrillation: Secondary | ICD-10-CM | POA: Diagnosis not present

## 2015-01-13 DIAGNOSIS — Z7901 Long term (current) use of anticoagulants: Secondary | ICD-10-CM | POA: Insufficient documentation

## 2015-01-13 DIAGNOSIS — I471 Supraventricular tachycardia: Secondary | ICD-10-CM | POA: Diagnosis not present

## 2015-01-13 DIAGNOSIS — I472 Ventricular tachycardia: Secondary | ICD-10-CM | POA: Insufficient documentation

## 2015-01-13 HISTORY — DX: Personal history of other diseases of the digestive system: Z87.19

## 2015-01-13 HISTORY — DX: Reserved for concepts with insufficient information to code with codable children: IMO0002

## 2015-01-13 HISTORY — DX: Anxiety disorder, unspecified: F41.9

## 2015-01-13 HISTORY — DX: Unspecified osteoarthritis, unspecified site: M19.90

## 2015-01-13 HISTORY — PX: CARDIOVERSION: SHX1299

## 2015-01-13 LAB — POCT INR: INR: 3.3

## 2015-01-13 SURGERY — CARDIOVERSION
Anesthesia: General

## 2015-01-13 MED ORDER — LABETALOL HCL 200 MG PO TABS
200.0000 mg | ORAL_TABLET | Freq: Two times a day (BID) | ORAL | Status: DC
  Administered 2015-01-13 – 2015-01-14 (×2): 200 mg via ORAL
  Filled 2015-01-13 (×3): qty 1

## 2015-01-13 MED ORDER — SODIUM CHLORIDE 0.9 % IV SOLN: 250.0000 mL | INTRAVENOUS | Status: DC | PRN

## 2015-01-13 MED ORDER — SODIUM CHLORIDE 0.9 % IV SOLN: INTRAVENOUS | Status: DC

## 2015-01-13 MED ORDER — PANTOPRAZOLE SODIUM 40 MG PO TBEC: 40.0000 mg | DELAYED_RELEASE_TABLET | Freq: Every day | ORAL | Status: DC | PRN

## 2015-01-13 MED ORDER — DILTIAZEM HCL 100 MG IV SOLR
5.0000 mg/h | INTRAVENOUS | Status: DC
  Administered 2015-01-13 – 2015-01-14 (×2): 5 mg/h via INTRAVENOUS
  Filled 2015-01-13 (×2): qty 100

## 2015-01-13 MED ORDER — PROPOFOL 10 MG/ML IV BOLUS
INTRAVENOUS | Status: DC | PRN
  Administered 2015-01-13: 50 mg via INTRAVENOUS
  Administered 2015-01-13: 5 mg via INTRAVENOUS

## 2015-01-13 MED ORDER — WARFARIN 1.25 MG HALF TABLET: 1.2500 mg | ORAL_TABLET | ORAL | Status: DC

## 2015-01-13 MED ORDER — ACETAMINOPHEN 325 MG PO TABS: 650.0000 mg | ORAL_TABLET | ORAL | Status: DC | PRN

## 2015-01-13 MED ORDER — SODIUM CHLORIDE 0.9 % IV SOLN
INTRAVENOUS | Status: DC | PRN
  Administered 2015-01-13: 14:00:00 via INTRAVENOUS

## 2015-01-13 MED ORDER — LORATADINE 10 MG PO TABS
10.0000 mg | ORAL_TABLET | Freq: Every day | ORAL | Status: DC
  Filled 2015-01-13 (×2): qty 1

## 2015-01-13 MED ORDER — IRBESARTAN 75 MG PO TABS
75.0000 mg | ORAL_TABLET | Freq: Every day | ORAL | Status: DC
  Administered 2015-01-14: 75 mg via ORAL
  Filled 2015-01-13: qty 1

## 2015-01-13 MED ORDER — LEVOTHYROXINE SODIUM 50 MCG PO TABS
50.0000 ug | ORAL_TABLET | Freq: Every day | ORAL | Status: DC
  Administered 2015-01-14: 50 ug via ORAL
  Filled 2015-01-13 (×2): qty 1

## 2015-01-13 MED ORDER — SODIUM CHLORIDE 0.9 % IJ SOLN: 3.0000 mL | INTRAMUSCULAR | Status: DC | PRN

## 2015-01-13 MED ORDER — WARFARIN 1.25 MG HALF TABLET
2.5000 mg | ORAL_TABLET | ORAL | Status: DC
  Filled 2015-01-13: qty 2

## 2015-01-13 MED ORDER — NITROGLYCERIN 0.4 MG SL SUBL: 0.4000 mg | SUBLINGUAL_TABLET | SUBLINGUAL | Status: DC | PRN

## 2015-01-13 MED ORDER — VALSARTAN-HYDROCHLOROTHIAZIDE 80-12.5 MG PO TABS: 1.0000 | ORAL_TABLET | Freq: Every day | ORAL | Status: DC

## 2015-01-13 MED ORDER — DOFETILIDE 125 MCG PO CAPS
125.0000 ug | ORAL_CAPSULE | Freq: Two times a day (BID) | ORAL | Status: DC
  Administered 2015-01-13 – 2015-01-14 (×2): 125 ug via ORAL
  Filled 2015-01-13 (×4): qty 1

## 2015-01-13 MED ORDER — ACETAMINOPHEN 500 MG PO TABS
500.0000 mg | ORAL_TABLET | Freq: Three times a day (TID) | ORAL | Status: DC | PRN
  Administered 2015-01-14: 500 mg via ORAL
  Filled 2015-01-13: qty 1

## 2015-01-13 MED ORDER — VITAMIN D3 25 MCG (1000 UNIT) PO TABS
1000.0000 [IU] | ORAL_TABLET | Freq: Every day | ORAL | Status: DC
  Administered 2015-01-14: 1000 [IU] via ORAL
  Filled 2015-01-13: qty 1

## 2015-01-13 MED ORDER — OMEPRAZOLE MAGNESIUM 20 MG PO TBEC: 20.0000 mg | DELAYED_RELEASE_TABLET | Freq: Every day | ORAL | Status: DC | PRN

## 2015-01-13 MED ORDER — ONDANSETRON HCL 4 MG/2ML IJ SOLN: 4.0000 mg | Freq: Four times a day (QID) | INTRAMUSCULAR | Status: DC | PRN

## 2015-01-13 MED ORDER — SODIUM CHLORIDE 0.9 % IJ SOLN
3.0000 mL | Freq: Two times a day (BID) | INTRAMUSCULAR | Status: DC
  Administered 2015-01-13 (×2): 3 mL via INTRAVENOUS

## 2015-01-13 MED ORDER — WARFARIN - PHYSICIAN DOSING INPATIENT: Freq: Every day | Status: DC

## 2015-01-13 MED ORDER — LIDOCAINE HCL (CARDIAC) 20 MG/ML IV SOLN
INTRAVENOUS | Status: DC | PRN
  Administered 2015-01-13: 50 mg via INTRAVENOUS

## 2015-01-13 MED ORDER — HYDROCHLOROTHIAZIDE 12.5 MG PO CAPS
12.5000 mg | ORAL_CAPSULE | Freq: Every day | ORAL | Status: DC
  Administered 2015-01-14: 12.5 mg via ORAL
  Filled 2015-01-13: qty 1

## 2015-01-13 NOTE — H&P (View-Only) (Signed)
Patient Care Team: Tommy Medal, MD as PCP - General   HPI  [name] is a 81 y.o. female  seen in followup for paroxysmal atrial fibrillation for which she takes Tikosyn.  She comes in today as an addon with complaints of recurring tachypalpitations  She was seen by her PCP last week who told her she was in atrial fibrillation. These ECGs were obtained and reviewed to look like sinus rhythm with some sinus arrhythmia.  Her blood pressures have been much improved and her edema has resolved.. She has however still had problems with fatigue. She has occasional palpitations.     Past Medical History  Diagnosis Date  . Atrial fibrillation     failure to tolerate Pradaxa secondary to GI symptoms  . First degree AV block   . SVT (supraventricular tachycardia)     possible atrial tachycardia, possible junctional tacycardia  . Hypertension   . Hypothyroidism   . Hyperlipidemia   . Gastroesophageal reflux disease   . Seasonal allergies   . H/O: hysterectomy   . Hx of echocardiogram     echo 07/2009:  mild focal basal septal hypertrophy, EF 55-60%, mild MR, mod LAE, mild RAE, PASP 45    Past Surgical History  Procedure Laterality Date  . Laparoscopic bilateral salpingo-oophorectomy    . Arthroscopic knee surgery right    . Abdominal hysterectomy      Current Outpatient Prescriptions  Medication Sig Dispense Refill  . diltiazem (CARDIZEM CD) 180 MG 24 hr capsule Take 180 mg by mouth daily as needed (HR).    . dofetilide (TIKOSYN) 125 MCG capsule TAKE 1 CAPSULE (125 MCG) BY MOUTH TWO TIMES DAILY AS DIRECTED. CONSULTMD BEFORE TAKING ANY NEW MEDICATIONS INCLUDING OTC/HERBAL PRODUCTS. 180 capsule 1  . fexofenadine (ALLEGRA) 180 MG tablet Take 180 mg by mouth daily as needed for allergies.     [name] labetalol (NORMODYNE) 200 MG tablet Take 1 tablet (200 mg total) by mouth 2 (two) times daily. 60 tablet 2  . levothyroxine (SYNTHROID, LEVOTHROID) 50 MCG tablet Take 50 mcg by mouth daily.       . Multiple Vitamins-Minerals (VITEYES AREDS FORMULA/LUTEIN) CAPS Take 1 capsule by mouth daily.     [name] omeprazole (PRILOSEC OTC) 20 MG tablet Take 20 mg by mouth daily as needed. For heartburn    . valsartan-hydrochlorothiazide (DIOVAN HCT) 160-12.5 MG per tablet Take 1 tablet by mouth daily. 30 tablet 3  . warfarin (COUMADIN) 2.5 MG tablet Take 1.25-2.5 mg by mouth daily. Take 1 tablet all days except Wednesday take 0.5 tablet    . [DISCONTINUED] pantoprazole (PROTONIX) 20 MG tablet Take 1 tablet (20 mg total) by mouth daily. 30 tablet 11  . [DISCONTINUED] pravastatin (PRAVACHOL) 80 MG tablet Take 40 mg by mouth Daily.     . [DISCONTINUED] spironolactone (ALDACTONE) 25 MG tablet Take 1/2 tablet by mouth once daily     No current facility-administered medications for this visit.    Allergies  Allergen Reactions  . Dabigatran     Unknown, per pt   . Sulfonamide Derivatives     REACTION: hives    Review of Systems negative except from HPI and PMH  PE BP 120/70 mmHg  Pulse 84  Ht 5\' 2"  (1.575 m)  Wt 118 lb 12.8 oz (53.887 kg)  BMI 21.72 kg/m2 Well developed and well nourished in no acute distress HENT normal E scleral and icterus clear Neck Supple JVP flat; carotids brisk and full Clear to ausculation Regular  and reasonably rapid r ate and rhythm, no murmurs gallops or rub Soft with active bowel sounds No clubbing cyanosis n Edema Alert and oriented, grossly normal motor and sensory function Skin Warm and Dry Repeat blood pressure was 182 palp   ECG  was ordered today and demonstrated atrial tachycardia with: 1 AV conductionAssessment and  Plan  Atrial fibrillation-paroxysmal  Atrial tachycardia  Hypertensive urgency  Fatigue   Her blood pressure is much improved and her palpitations likewise in the context of labetalol therapy. Her 12-lead demonstrates atrial tachycardia 2 to one and either this or her relatively low blood pressure may be contributing to her  fatigue. As a consequence, we will decrease her valsartan HCTZ 160/12-1/2-80/6.25 in about 10 days to 2 weeks she will let us know what her heart rates are doing and what her blood pressure is doing.   Given her atrial tachycardia we will undertake cardioversion I suspect the 2 aforementioned interventions will be associated with improved functional status

## 2015-01-13 NOTE — Interval H&P Note (Signed)
History and Physical Interval Note:  01/13/2015 8:00 AM  [name]  has presented today for surgery, with the diagnosis of atac  The various methods of treatment have been discussed with the patient and family. After consideration of risks, benefits and other options for treatment, the patient has consented to  Procedure(s): CARDIOVERSION (N/A) as a surgical intervention .  The patient's history has been reviewed, patient examined, no change in status, stable for surgery.  I have reviewed the patient's chart and labs.  Questions were answered to the patient's satisfaction.     Jenkins Rouge

## 2015-01-13 NOTE — Anesthesia Preprocedure Evaluation (Addendum)
Anesthesia Evaluation  Patient identified by MRN, date of birth, ID band Patient awake    Reviewed: Allergy & Precautions, H&P , NPO status , Patient's Chart, lab work & pertinent test results, reviewed documented beta blocker date and time   Airway Mallampati: II  TM Distance: >3 FB Neck ROM: Full    Dental no notable dental hx. (+) Teeth Intact, Dental Advidsory Given   Pulmonary neg pulmonary ROS,  breath sounds clear to auscultation  Pulmonary exam normal       Cardiovascular hypertension, Pt. on medications and Pt. on home beta blockers Normal cardiovascular exam+ dysrhythmias Rhythm:Regular Rate:Normal     Neuro/Psych  Headaches, negative psych ROS   GI/Hepatic Neg liver ROS, GERD-  ,  Endo/Other  Hypothyroidism   Renal/GU negative Renal ROS     Musculoskeletal negative musculoskeletal ROS (+)   Abdominal   Peds  Hematology negative hematology ROS (+)   Anesthesia Other Findings   Reproductive/Obstetrics negative OB ROS                            Anesthesia Physical Anesthesia Plan  ASA: III  Anesthesia Plan: General   Post-op Pain Management:    Induction: Intravenous  Airway Management Planned: Mask  Additional Equipment:   Intra-op Plan:   Post-operative Plan:   Informed Consent: I have reviewed the patients History and Physical, chart, labs and discussed the procedure including the risks, benefits and alternatives for the proposed anesthesia with the patient or authorized representative who has indicated his/her understanding and acceptance.   Dental advisory given  Plan Discussed with: CRNA  Anesthesia Plan Comments:         Anesthesia Quick Evaluation

## 2015-01-13 NOTE — H&P (Signed)
Physician History and Physical     Patient ID: [name] MRN: [id number] DOB/AGE: October 07, 1924 81 y.o. Admit date: 01/13/2015  Primary Care Physician: Thressa Sheller, MD Primary Cardiologist:  Caryl Comes   Active Problems:   * No active hospital problems. *   HPI:  81 y.o. patient of Dr Caryl Comes.  Set up for [facility] today.  Long history of atrial arrhythmias including PAF and atrial tachycardia.  Failed to tolerate pradaxa with GI symptoms On coumadin with INR Rx today 3.3  EF normal with mild MR and moderate LAE.  No history of CAD.  Labile HTN with recent med changes.  Spouse of over 50 years died on her birthday a year ago and she has not done well since.  Increasing palpitations and tachycardia with fatigue and dyspnea.  Attempted [place] today on tikosyn.  x4 120 J then 200J x 3.  Converted for seconds but then would have PAC's and go back into atrial tachycardia.  Started on iv cardizem post procedure in endo and admitted to telemetry for further Rx    Review of systems complete and found to be negative unless listed above   Past Medical History  Diagnosis Date  . Atrial fibrillation     failure to tolerate Pradaxa secondary to GI symptoms  . First degree AV block   . SVT (supraventricular tachycardia)     possible atrial tachycardia, possible junctional tacycardia  . Hypertension   . Hypothyroidism   . Hyperlipidemia   . Gastroesophageal reflux disease   . Seasonal allergies   . H/O: hysterectomy   . Hx of echocardiogram     echo 07/2009:  mild focal basal septal hypertrophy, EF 55-60%, mild MR, mod LAE, mild RAE, PASP 45    Family History  Problem Relation Age of Onset  . Heart disease Father     History   Social History  . Marital Status: Married    Spouse Name: N/A  . Number of Children: N/A  . Years of Education: N/A   Occupational History  . Not on file.   Social History Main Topics  . Smoking status: Never Smoker   . Smokeless tobacco: Not on file  . Alcohol Use:  No  . Drug Use: No  . Sexual Activity: Not on file   Other Topics Concern  . Not on file   Social History Narrative   She has two children, two grandchildren and three great grandchildren    Past Surgical History  Procedure Laterality Date  . Laparoscopic bilateral salpingo-oophorectomy    . Arthroscopic knee surgery right    . Abdominal hysterectomy       Prescriptions prior to admission  Medication Sig Dispense Refill Last Dose  . acetaminophen (TYLENOL) 500 MG tablet Take 500 mg by mouth every 8 (eight) hours as needed for mild pain or moderate pain.   Past Week at Unknown time  . cholecalciferol (VITAMIN D) 1000 UNITS tablet Take 1,000 Units by mouth daily.   01/13/2015 at Unknown time  . diltiazem (CARDIZEM CD) 180 MG 24 hr capsule Take 180 mg by mouth daily as needed (HR).   01/12/2015 at Unknown time  . dofetilide (TIKOSYN) 125 MCG capsule TAKE 1 CAPSULE (125 MCG) BY MOUTH TWO TIMES DAILY AS DIRECTED. CONSULTMD BEFORE TAKING ANY NEW MEDICATIONS INCLUDING OTC/HERBAL PRODUCTS. 180 capsule 1 01/13/2015 at Unknown time  . fexofenadine (ALLEGRA) 180 MG tablet Take 180 mg by mouth daily as needed for allergies.    01/12/2015 at Unknown time  .  labetalol (NORMODYNE) 200 MG tablet Take 1 tablet (200 mg total) by mouth 2 (two) times daily. 60 tablet 2 01/13/2015 at Unknown time  . levothyroxine (SYNTHROID, LEVOTHROID) 50 MCG tablet Take 50 mcg by mouth daily.     01/13/2015 at Unknown time  . Melatonin 3 MG TABS Take 1 tablet by mouth at bedtime.   01/12/2015 at Unknown time  . Multiple Vitamins-Minerals (VITEYES AREDS FORMULA/LUTEIN) CAPS Take 1 capsule by mouth daily.    01/12/2015 at Unknown time  . omeprazole (PRILOSEC OTC) 20 MG tablet Take 20 mg by mouth daily as needed. For heartburn   01/12/2015 at Unknown time  . valsartan-hydrochlorothiazide (DIOVAN HCT) 80-12.5 MG per tablet Take 1 tablet by mouth daily. 30 tablet 3 01/12/2015 at Unknown time  . warfarin (COUMADIN) 2.5 MG tablet Take  1.25-2.5 mg by mouth daily. Take 1 tablet all days except Wednesday, take 1.25 mg   01/12/2015 at Unknown time    Physical Exam: Blood pressure 189/94, pulse 98, temperature 97.7 F (36.5 C), temperature source Oral, resp. rate 13, SpO2 100 %.   Depressed Frail elderly female  HEENT: normal Neck supple with no adenopathy JVP normal no bruits no thyromegaly Lungs clear with no wheezing and good diaphragmatic motion Heart:  S1/S2 no murmur, no rub, gallop or click PMI normal Abdomen: benighn, BS positve, no tenderness, no AAA no bruit.  No HSM or HJR Distal pulses intact with no bruits No edema Neuro non-focal Skin warm and dry No muscular weakness  No current facility-administered medications on file prior to encounter.   Current Outpatient Prescriptions on File Prior to Encounter  Medication Sig Dispense Refill  . diltiazem (CARDIZEM CD) 180 MG 24 hr capsule Take 180 mg by mouth daily as needed (HR).    . dofetilide (TIKOSYN) 125 MCG capsule TAKE 1 CAPSULE (125 MCG) BY MOUTH TWO TIMES DAILY AS DIRECTED. CONSULTMD BEFORE TAKING ANY NEW MEDICATIONS INCLUDING OTC/HERBAL PRODUCTS. 180 capsule 1  . fexofenadine (ALLEGRA) 180 MG tablet Take 180 mg by mouth daily as needed for allergies.     [name] labetalol (NORMODYNE) 200 MG tablet Take 1 tablet (200 mg total) by mouth 2 (two) times daily. 60 tablet 2  . levothyroxine (SYNTHROID, LEVOTHROID) 50 MCG tablet Take 50 mcg by mouth daily.      . Multiple Vitamins-Minerals (VITEYES AREDS FORMULA/LUTEIN) CAPS Take 1 capsule by mouth daily.     [name] omeprazole (PRILOSEC OTC) 20 MG tablet Take 20 mg by mouth daily as needed. For heartburn    . valsartan-hydrochlorothiazide (DIOVAN HCT) 80-12.5 MG per tablet Take 1 tablet by mouth daily. 30 tablet 3  . warfarin (COUMADIN) 2.5 MG tablet Take 1.25-2.5 mg by mouth daily. Take 1 tablet all days except Wednesday, take 1.25 mg    . [DISCONTINUED] pantoprazole (PROTONIX) 20 MG tablet Take 1 tablet (20 mg total)  by mouth daily. 30 tablet 11  . [DISCONTINUED] pravastatin (PRAVACHOL) 80 MG tablet Take 40 mg by mouth Daily.     . [DISCONTINUED] spironolactone (ALDACTONE) 25 MG tablet Take 1/2 tablet by mouth once daily      Labs:   Lab Results  Component Value Date   WBC 5.7 01/04/2015   HGB 10.8* 01/04/2015   HCT 32.3* 01/04/2015   MCV 78.2 01/04/2015   PLT 232.0 01/04/2015   No results for input(s): NA, K, CL, CO2, BUN, CREATININE, CALCIUM, PROT, BILITOT, ALKPHOS, ALT, AST, GLUCOSE in the last 168 hours.  Invalid input(s): LABALBU Lab Results  Component  Value Date   CKTOTAL 52 12/13/2009   CKTOTAL 55 12/13/2009   CKTOTAL 71 12/13/2009   CKMB 1.7 12/13/2009   CKMB 1.7 12/13/2009   CKMB 1.9 12/13/2009   TROPONINI 0.01        NO INDICATION OF MYOCARDIAL INJURY. 12/13/2009   TROPONINI 0.01        NO INDICATION OF MYOCARDIAL INJURY. 12/13/2009   TROPONINI 0.02        NO INDICATION OF MYOCARDIAL INJURY. 12/13/2009     Lab Results  Component Value Date   CHOL  08/23/2009    176        ATP III CLASSIFICATION:  <200     mg/dL   Desirable  200-239  mg/dL   Borderline High  >=240    mg/dL   High          CHOL  12/25/2008    177        ATP III CLASSIFICATION:  <200     mg/dL   Desirable  200-239  mg/dL   Borderline High  >=240    mg/dL   High          Lab Results  Component Value Date   HDL 79 08/23/2009   HDL 65 12/25/2008   Lab Results  Component Value Date   LDLCALC  08/23/2009    84        Total Cholesterol/HDL:CHD Risk Coronary Heart Disease Risk Table                     Men   Women  1/2 Average Risk   3.4   3.3  Average Risk       5.0   4.4  2 X Average Risk   9.6   7.1  3 X Average Risk  23.4   11.0        Use the calculated Patient Ratio above and the CHD Risk Table to determine the patient's CHD Risk.        ATP III CLASSIFICATION (LDL):  <100     mg/dL   Optimal  100-129  mg/dL   Near or Above                    Optimal  130-159  mg/dL    Borderline  160-189  mg/dL   High  >190     mg/dL   Very High   LDLCALC  12/25/2008    89        Total Cholesterol/HDL:CHD Risk Coronary Heart Disease Risk Table                     Men   Women  1/2 Average Risk   3.4   3.3  Average Risk       5.0   4.4  2 X Average Risk   9.6   7.1  3 X Average Risk  23.4   11.0        Use the calculated Patient Ratio above and the CHD Risk Table to determine the patient's CHD Risk.        ATP III CLASSIFICATION (LDL):  <100     mg/dL   Optimal  100-129  mg/dL   Near or Above                    Optimal  130-159  mg/dL   Borderline  160-189  mg/dL   High  >190  mg/dL   Very High   Lab Results  Component Value Date   TRIG 63 08/23/2009   TRIG 115 12/25/2008   Lab Results  Component Value Date   CHOLHDL 2.2 08/23/2009   CHOLHDL 2.7 12/25/2008   No results found for: LDLDIRECT     Radiology: No results found.  EKG:  Atrial tachycardia ventricular rate 84 01/04/15  ASSESSMENT AND PLAN:   Arrhythmia:  Discussed with Dr Caryl Comes and notified PA Chanetta Marshall.  Admit with rate control iv cardizem hold PO cardizem Already on labatolol for HTN.  Continue coumadin INR Rx check INR in am.  Likely LA focus and given her age and frailty not an ideal candidate for transeptal and LA ablation.  Consider AV node ablation and pacing.  Hopefully Dr Rayann Heman to see latter today.  Continue tikosyn  HTN:  Continue outpatient meds   Thyroid:   Continue replacement  Lab Results  Component Value Date   TSH 0.94 11/02/2014     Signed: Collier Salina Nishan6/17/2016, 2:53 PM

## 2015-01-13 NOTE — CV Procedure (Signed)
Anesthesia:  Propofol and lidocaine INR Rx :  3.3  DCC x 4 120 x1 and 200J x3  Converted for short time but then would go back into atrial tachycardia rates 130 Occasional afib but mostly atrial tachycardia.  Discussed with Dr Caryl Comes:  Admit Iv cardizem for rate control.  She is very elderly with likely LA focus and not A great candidate for transeptal and LA ablation  May be easier to do AV node ablation and pacer.  She does  Not feel well at all with her tachycardia and is already on tikosyn.    [name]

## 2015-01-13 NOTE — Transfer of Care (Signed)
Immediate Anesthesia Transfer of Care Note  Patient: [name]  Procedure(s) Performed: Procedure(s): CARDIOVERSION (N/A)  Patient Location: Endoscopy Unit  Anesthesia Type:General  Level of Consciousness: awake, alert  and oriented  Airway & Oxygen Therapy: Patient Spontanous Breathing and Patient connected to nasal cannula oxygen  Post-op Assessment: Report given to RN, Post -op Vital signs reviewed and stable and Patient moving all extremities X 4  Post vital signs: Reviewed and stable  Last Vitals:  Filed Vitals:   01/13/15 1354  BP: 172/96  Pulse: 114  Temp:   Resp: 18    Complications: No apparent anesthesia complications

## 2015-01-13 NOTE — Progress Notes (Signed)
PHARMACIST - PHYSICIAN COMMUNICATION  CONCERNING:  Coumadin   RECOMMENDATION: Have held dose for tonight - will resume home dose tomorrow Follow up AM INR  DESCRIPTION: INR = 3.3 on admission

## 2015-01-13 NOTE — Telephone Encounter (Signed)
Spoke with patient who tells me she had INR drawn at [facility] on Monday instead. Spoke with medical records there who confirms INR on 6/13 2.4. They are faxing over lab report and I will have it scanned into chart STAT for preparation of DCCV this afternoon.

## 2015-01-13 NOTE — Anesthesia Postprocedure Evaluation (Signed)
  Anesthesia Post-op Note  Patient: [name]  Procedure(s) Performed: Procedure(s): CARDIOVERSION (N/A)  Patient Location: Endoscopy Unit  Anesthesia Type:General  Level of Consciousness: awake, alert  and oriented  Airway and Oxygen Therapy: Patient Spontanous Breathing and Patient connected to nasal cannula oxygen  Post-op Pain: none  Post-op Assessment: Post-op Vital signs reviewed, Patient's Cardiovascular Status Stable, Respiratory Function Stable, Patent Airway and No signs of Nausea or vomiting              Post-op Vital Signs: Reviewed and stable  Last Vitals:  Filed Vitals:   01/13/15 1354  BP: 172/96  Pulse: 114  Temp:   Resp: 18    Complications: No apparent anesthesia complications

## 2015-01-14 DIAGNOSIS — I472 Ventricular tachycardia: Secondary | ICD-10-CM | POA: Diagnosis not present

## 2015-01-14 DIAGNOSIS — E039 Hypothyroidism, unspecified: Secondary | ICD-10-CM | POA: Diagnosis not present

## 2015-01-14 DIAGNOSIS — I471 Supraventricular tachycardia: Secondary | ICD-10-CM | POA: Diagnosis not present

## 2015-01-14 DIAGNOSIS — E785 Hyperlipidemia, unspecified: Secondary | ICD-10-CM | POA: Diagnosis not present

## 2015-01-14 DIAGNOSIS — I480 Paroxysmal atrial fibrillation: Secondary | ICD-10-CM | POA: Diagnosis not present

## 2015-01-14 DIAGNOSIS — I10 Essential (primary) hypertension: Secondary | ICD-10-CM

## 2015-01-14 LAB — CBC
HCT: 30.5 % — ABNORMAL LOW (ref 36.0–46.0)
Hemoglobin: 10.1 g/dL — ABNORMAL LOW (ref 12.0–15.0)
MCH: 25.8 pg — ABNORMAL LOW (ref 26.0–34.0)
MCHC: 33.1 g/dL (ref 30.0–36.0)
MCV: 78 fL (ref 78.0–100.0)
Platelets: 214 10*3/uL (ref 150–400)
RBC: 3.91 MIL/uL (ref 3.87–5.11)
RDW: 14.4 % (ref 11.5–15.5)
WBC: 5.4 10*3/uL (ref 4.0–10.5)

## 2015-01-14 LAB — BASIC METABOLIC PANEL
Anion gap: 8 (ref 5–15)
BUN: 17 mg/dL (ref 6–20)
CO2: 26 mmol/L (ref 22–32)
Calcium: 8.7 mg/dL — ABNORMAL LOW (ref 8.9–10.3)
Chloride: 97 mmol/L — ABNORMAL LOW (ref 101–111)
Creatinine, Ser: 1.08 mg/dL — ABNORMAL HIGH (ref 0.44–1.00)
GFR calc non Af Amer: 44 mL/min — ABNORMAL LOW (ref >60)
GFR, EST AFRICAN AMERICAN: 52 mL/min — AB (ref >60)
GLUCOSE: 96 mg/dL (ref 65–99)
POTASSIUM: 4.1 mmol/L (ref 3.5–5.1)
SODIUM: 131 mmol/L — AB (ref 135–145)

## 2015-01-14 LAB — PROTIME-INR
INR: 2.2 — ABNORMAL HIGH (ref 0.00–1.49)
Prothrombin Time: 24.2 seconds — ABNORMAL HIGH (ref 11.6–15.2)

## 2015-01-14 MED ORDER — DILTIAZEM HCL ER COATED BEADS 180 MG PO CP24
180.0000 mg | ORAL_CAPSULE | Freq: Every day | ORAL | Status: DC
  Administered 2015-01-14: 180 mg via ORAL
  Filled 2015-01-14: qty 1

## 2015-01-14 MED ORDER — DILTIAZEM HCL ER COATED BEADS 180 MG PO CP24: 180.0000 mg | ORAL_CAPSULE | Freq: Every day | ORAL | Status: DC

## 2015-01-14 MED ORDER — VALSARTAN 80 MG PO TABS: 80.0000 mg | ORAL_TABLET | Freq: Every day | ORAL | Status: DC

## 2015-01-14 NOTE — Progress Notes (Addendum)
Patient ID: [name], female   DOB: [birth year], 81 y.o.   MRN: [id number]    Patient Name: [name] Date of Encounter: 01/14/2015     Active Problems:   SVT/ PSVT/ PAT   Atrial fibrillation   Anticoagulated on Coumadin   Atrial tachycardia    SUBJECTIVE  Feels better today. She has done well from a rhythm perspective on low dose cardizem.  CURRENT MEDS . cholecalciferol  1,000 Units Oral Daily  . dofetilide  125 mcg Oral BID  . irbesartan  75 mg Oral Daily   And  . hydrochlorothiazide  12.5 mg Oral Daily  . labetalol  200 mg Oral BID  . levothyroxine  50 mcg Oral QAC breakfast  . loratadine  10 mg Oral Daily  . sodium chloride  3 mL Intravenous Q12H  . [START ON 01/18/2015] warfarin  1.25 mg Oral Once per day on Wed  . warfarin  2.5 mg Oral Once per day on Sun Mon Tue Thu Fri Sat  . Warfarin - Physician Dosing Inpatient   Does not apply q1800    OBJECTIVE  Filed Vitals:   01/13/15 1607 01/13/15 2054 01/14/15 0523 01/14/15 0900  BP: 186/93 150/75 113/61 130/58  Pulse: 79 76 69   Temp:  98.4 F (36.9 C) 98.2 F (36.8 C)   TempSrc:  Oral Oral   Resp: 18 18 18    Weight: 115 lb 1.3 oz (52.2 kg)  114 lb (51.71 kg)   SpO2: 100% 99% 99%     Intake/Output Summary (Last 24 hours) at 01/14/15 1208 Last data filed at 01/14/15 0800  Gross per 24 hour  Intake    614 ml  Output      0 ml  Net    614 ml   Filed Weights   01/13/15 1607 01/14/15 0523  Weight: 115 lb 1.3 oz (52.2 kg) 114 lb (51.71 kg)    PHYSICAL EXAM  General: Pleasant, elderly woman, NAD. Neuro: Alert and oriented X 3. Moves all extremities spontaneously. Psych: Normal affect. HEENT:  Normal  Neck: Supple without bruits or JVD. Lungs:  Resp regular and unlabored, CTA. Heart: RRR no s3, s4, or murmurs. Abdomen: Soft, non-tender, non-distended, BS + x 4.  Extremities: No clubbing, cyanosis or edema. DP/PT/Radials 2+ and equal bilaterally.  Accessory Clinical Findings  CBC  Recent  Labs  01/14/15 0338  WBC 5.4  HGB 10.1*  HCT 30.5*  MCV 78.0  PLT 492   Basic Metabolic Panel  Recent Labs  01/14/15 0338  NA 131*  K 4.1  CL 97*  CO2 26  GLUCOSE 96  BUN 17  CREATININE 1.08*  CALCIUM 8.7*   Liver Function Tests No results for input(s): AST, ALT, ALKPHOS, BILITOT, PROT, ALBUMIN in the last 72 hours. No results for input(s): LIPASE, AMYLASE in the last 72 hours. Cardiac Enzymes No results for input(s): CKTOTAL, CKMB, CKMBINDEX, TROPONINI in the last 72 hours. BNP Invalid input(s): POCBNP D-Dimer No results for input(s): DDIMER in the last 72 hours. Hemoglobin A1C No results for input(s): HGBA1C in the last 72 hours. Fasting Lipid Panel No results for input(s): CHOL, HDL, LDLCALC, TRIG, CHOLHDL, LDLDIRECT in the last 72 hours. Thyroid Function Tests No results for input(s): TSH, T4TOTAL, T3FREE, THYROIDAB in the last 72 hours.  Invalid input(s): FREET3  TELE  nsr  Radiology/Studies  No results found.  ASSESSMENT AND PLAN  1. Atrial tachy/fibrillation 2. Tikosyn therapy 3. S/p DCCV, with ERAF Rec: she is doing  well on low dose IV cardizem. I have recommended she be allowed to go home on po cardizem and Tikosyn. She would like to hold off on AV node ablation/PPM for now. She will need close followup with Dr. Caryl Comes. We will stop the HCTZ as it is contra-indicated with Tikosyn.  Essica Kiker,M.D.  01/14/2015 12:08 PM

## 2015-01-14 NOTE — Discharge Summary (Signed)
Discharge Summary   Patient ID: [name],  MRN: [id number], DOB/AGE: [birth year] 79 y.o.  Admit date: 01/13/2015 Discharge date: 01/14/2015  Primary Care Provider: Thressa Sheller Primary Cardiologist: Dr. Caryl Comes  Discharge Diagnoses Principal Problem:   Atrial fibrillation Active Problems:   Hyperlipidemia   HYPERTENSION, BENIGN   SVT/ PSVT/ PAT   Anticoagulated on Coumadin   Atrial tachycardia   Allergies Allergies  Allergen Reactions  . Dabigatran     Unknown, per pt   . Sulfonamide Derivatives     REACTION: hives    Procedures  Outpatient DCCV 01/13/2015  Nord x 4 120 x1 and 200J x3 Converted for short time but then would go back into atrial tachycardia rates 130 Occasional afib but mostly atrial tachycardia.  Discussed with Dr Caryl Comes: Admit Iv cardizem for rate control. She is very elderly with likely LA focus and not A great candidate for transeptal and LA ablation May be easier to do AV node ablation and pacer. She does  Not feel well at all with her tachycardia and is already on tikosyn.     Hospital Course  The patient is a 81 year old female with PMH of hypertension, hypothyroidism, hyperlipidemia, history of atrial fibrillation on BB and PRN diltiazem along with Tikosyn at home. She has failed to tolerate Pradaxa in the past due to GI symptoms. Her atrial fibrillation has been controlled on Coumadin. She presented to to [facility] on 01/13/2015 for outpatient DC cardioversion. She was cardioverted multiple times, however would go back to atrial tachycardia with rate of 130 shortly after. After discussing with Dr. Caryl Comes, it was decided to admit the patient to start on IV diltiazem for rate control. Per Dr. Johnsie Cancel, she is a very elderly patient and not a great candidate for transseptal atrial ablation, though she may be a candidate for AV nodal ablation and pacemaker. She was seen on the following morning on 01/14/2015, her heart rate is better  controlled after addition of IV diltiazem. Of note, she was previously on scheduled labetalol at home along with PRN diltiazem. She will need a scheduled 180 mg PO diltiazem from this point forward for better rate control.  Dr. Lovena Le has discussed with the patient regarding AV nodal ablation and pacemaker therapy, she would like to hold off on the procedure for now. She will need close follow-up with Dr. Caryl Comes which I will arrange. Of note, significant medication change during this admission include change previous PRN 180 mg diltiazem CD to scheduled dosing. Discontinuation of previous valsartan/hydrochlorothiazide combination due to interaction of HCTZ with Tikosyn, I have prescribed the patient valsartan 80 mg daily to substitute. Our office scheduler will contact the patient to arrange close follow-up in 1 to 3 weeks with Dr. Caryl Comes or his APP in the clinic.   Discharge Vitals Blood pressure 130/58, pulse 69, temperature 98.2 F (36.8 C), temperature source Oral, resp. rate 18, weight 114 lb (51.71 kg), SpO2 99 %.  Filed Weights   01/13/15 1607 01/14/15 0523  Weight: 115 lb 1.3 oz (52.2 kg) 114 lb (51.71 kg)    Labs  CBC  Recent Labs  01/14/15 0338  WBC 5.4  HGB 10.1*  HCT 30.5*  MCV 78.0  PLT 379   Basic Metabolic Panel  Recent Labs  01/14/15 0338  NA 131*  K 4.1  CL 97*  CO2 26  GLUCOSE 96  BUN 17  CREATININE 1.08*  CALCIUM 8.7*    Disposition  Pt is being discharged home today in good  condition.  Follow-up Plans & Appointments      Follow-up Information    Follow up with Virl Axe, MD.   Specialty:  Cardiology   Why:  Office scheduler will contact you to arrange outpatient followup with Dr. Caryl Comes, please give [place] a call if you do not hear from [place] in 2 business days   Contact information:   [address] [place] [address] [place] [address] [phone]       Discharge Medications    Medication List    STOP taking these medications         valsartan-hydrochlorothiazide 80-12.5 MG per tablet  Commonly known as:  DIOVAN HCT      TAKE these medications        acetaminophen 500 MG tablet  Commonly known as:  TYLENOL  Take 500 mg by mouth every 8 (eight) hours as needed for mild pain or moderate pain.     cholecalciferol 1000 UNITS tablet  Commonly known as:  VITAMIN D  Take 1,000 Units by mouth daily.     diltiazem 180 MG 24 hr capsule  Commonly known as:  CARDIZEM CD  Take 1 capsule (180 mg total) by mouth daily.     dofetilide 125 MCG capsule  Commonly known as:  TIKOSYN  TAKE 1 CAPSULE (125 MCG) BY MOUTH TWO TIMES DAILY AS DIRECTED. CONSULTMD BEFORE TAKING ANY NEW MEDICATIONS INCLUDING OTC/HERBAL PRODUCTS.     fexofenadine 180 MG tablet  Commonly known as:  ALLEGRA  Take 180 mg by mouth daily as needed for allergies.     labetalol 200 MG tablet  Commonly known as:  NORMODYNE  Take 1 tablet (200 mg total) by mouth 2 (two) times daily.     levothyroxine 50 MCG tablet  Commonly known as:  SYNTHROID, LEVOTHROID  Take 50 mcg by mouth daily.     Melatonin 3 MG Tabs  Take 1 tablet by mouth at bedtime.     omeprazole 20 MG tablet  Commonly known as:  PRILOSEC OTC  Take 20 mg by mouth daily as needed. For heartburn     valsartan 80 MG tablet  Commonly known as:  DIOVAN  Take 1 tablet (80 mg total) by mouth daily.     VITEYES AREDS FORMULA/LUTEIN Caps  Take 1 capsule by mouth daily.     warfarin 2.5 MG tablet  Commonly known as:  COUMADIN  Take 1.25-2.5 mg by mouth daily. Take 1 tablet all days except Wednesday, take 1.25 mg        Duration of Discharge Encounter   Greater than 30 minutes including physician time.  Hilbert Corrigan PA-C Pager: [phone] 01/14/2015, 12:33 PM   Cardiology Attending  Patient seen and examined. Agree with above.  Mikle Bosworth.D.

## 2015-01-16 ENCOUNTER — Encounter (HOSPITAL_COMMUNITY): Payer: Self-pay | Admitting: Cardiovascular Disease

## 2015-01-16 NOTE — Anesthesia Postprocedure Evaluation (Deleted)
Anesthesia Post Note  Patient: [name]  Procedure(s) Performed: Procedure(s) (LRB): CARDIOVERSION (N/A)  Anesthesia type: General  Patient location: PACU  Post pain: Pain level controlled  Post assessment: Post-op Vital signs reviewed  Last Vitals: BP 130/58 mmHg  Pulse 69  Temp(Src) 36.8 C (Oral)  Resp 18  Wt 114 lb (51.71 kg)  SpO2 99%  Post vital signs: Reviewed  Level of consciousness: sedated  Complications: No apparent anesthesia complications

## 2015-01-18 ENCOUNTER — Telehealth: Payer: Self-pay | Admitting: Internal Medicine

## 2015-01-18 NOTE — Telephone Encounter (Signed)
Patient calling to schedule follow up post hospital. Scheduled her to see Chanetta Marshall, NP on 7/6.  (will confirm with Dr. Caryl Comes that it is ok to arrange this with Amber)

## 2015-01-18 NOTE — Telephone Encounter (Signed)
New problem    Pt need to speak to nurse concerning her being in the hospital over the weekend to get her heart shocked. Please call pt.

## 2015-01-20 MED ORDER — LABETALOL HCL 100 MG PO TABS: 100.0000 mg | ORAL_TABLET | Freq: Two times a day (BID) | ORAL | Status: DC

## 2015-01-20 NOTE — Addendum Note (Signed)
Addended by: Stanton Kidney on: 01/20/2015 06:42 PM   Modules accepted: Orders

## 2015-01-20 NOTE — Telephone Encounter (Addendum)
Reviewed with Caryl Comes - ok for patient to see Amber Pt reports light headedness and low BPs:  107/80, 111/62, 116/65 - since starting Cardizem. Advised that I will review with Dr. Caryl Comes and let her know recommendations - she is agreeable to this.

## 2015-01-20 NOTE — Telephone Encounter (Signed)
Instructed patient to decrease labetalol to 100 mg BID, per Dr. Caryl Comes. Patient verbalized understanding and agreeable to plan.   She will call office if no improvement.

## 2015-02-01 ENCOUNTER — Ambulatory Visit (INDEPENDENT_AMBULATORY_CARE_PROVIDER_SITE_OTHER): Payer: Medicare Other | Admitting: Nurse Practitioner

## 2015-02-01 ENCOUNTER — Encounter: Payer: Self-pay | Admitting: Nurse Practitioner

## 2015-02-01 VITALS — BP 130/68 | HR 81 | Ht 62.0 in | Wt 117.0 lb

## 2015-02-01 DIAGNOSIS — I481 Persistent atrial fibrillation: Secondary | ICD-10-CM

## 2015-02-01 DIAGNOSIS — I10 Essential (primary) hypertension: Secondary | ICD-10-CM

## 2015-02-01 DIAGNOSIS — I4819 Other persistent atrial fibrillation: Secondary | ICD-10-CM

## 2015-02-01 MED ORDER — DILTIAZEM HCL ER COATED BEADS 180 MG PO CP24: 180.0000 mg | ORAL_CAPSULE | Freq: Every day | ORAL | Status: DC

## 2015-02-01 NOTE — Progress Notes (Signed)
Electrophysiology Office Note Date: 02/01/2015  ID:  [name], DOB 1925-04-26, MRN [id number]  PCP: Kaitlyn Sheller, MD Electrophysiologist: [name]  CC: hospitalization follow up after cardioversion  [name] is a 81 y.o. female seen today for Dr [name].  She has a history of atrial fibrillation and has been maintained on Tikosyn. She had recurrent atrial fibrillation and underwent unsuccessful cardioversion 01/13/15.  She was admitted and placed on Cardizem with improvement in heart rates.  She presents today for follow-up.  PPM and AVN ablation were discussed with the patient during recent hospitalization but she preferred to try medical therapy first.  Since discharge, the patient reports doing reasonably well.  She reports that she is 80%.  She is having occasional orthostatic intolerance as well as exertional chest tightness.  She denies dyspnea, PND, orthopnea, nausea, vomiting, dizziness, syncope, edema, weight gain, or early satiety.  Past Medical History  Diagnosis Date  . Persistent atrial fibrillation        . First degree AV block   . SVT (supraventricular tachycardia)     possible atrial tachycardia, possible junctional tacycardia  . Hypertension   . Hypothyroidism   . Hyperlipidemia   . Gastroesophageal reflux disease   . Seasonal allergies   . Hx of echocardiogram     echo 07/2009:  mild focal basal septal hypertrophy, EF 55-60%, mild MR, mod LAE, mild RAE, PASP 45  . Squamous carcinoma 2016    "right leg; right jawline"  . History of hiatal hernia   . Arthritis     "back; hands" (01/13/2015)  . Anxiety    Past Surgical History  Procedure Laterality Date  . Laparoscopic bilateral salpingo oopherectomy Bilateral ~ 2004  . Knee arthroscopy Right 1990's  . Abdominal hysterectomy  ~ 1975  . Cardioversion  01/13/2015    "didn't work"  . Appendectomy  ~ 1975  . Cataract extraction w/ intraocular lens  implant, bilateral Bilateral 2011  . Dilation and  curettage of uterus    . Cardiac catheterization  1990's  . Squamous cell carcinoma excision  2016    "right leg; right jawline"  . Cardioversion N/A 01/13/2015    Procedure: CARDIOVERSION;  Surgeon: Kaitlyn Hector, MD;  Location: [facility] END[facility]COPY;  Service: Cardiovascular;  Laterality: N/A;    Current Outpatient Prescriptions  Medication Sig Dispense Refill  . acetaminophen (TYLENOL) 500 MG tablet Take 500 mg by mouth every 8 (eight) hours as needed for mild pain or moderate pain.    . cholecalciferol (VITAMIN D) 1000 UNITS tablet Take 1,000 Units by mouth daily.    [name] diltiazem (CARDIZEM CD) 180 MG 24 hr capsule Take 1 capsule (180 mg total) by mouth daily. 30 capsule 3  . dofetilide (TIKOSYN) 125 MCG capsule TAKE 1 CAPSULE (125 MCG) BY MOUTH TWO TIMES DAILY AS DIRECTED. CONSULTMD BEFORE TAKING ANY NEW MEDICATIONS INCLUDING OTC/HERBAL PRODUCTS. 180 capsule 1  . fexofenadine (ALLEGRA) 180 MG tablet Take 180 mg by mouth daily as needed for allergies.     [name] labetalol (NORMODYNE) 100 MG tablet Take 1 tablet (100 mg total) by mouth 2 (two) times daily. 60 tablet 6  . levothyroxine (SYNTHROID, LEVOTHROID) 50 MCG tablet Take 50 mcg by mouth daily.      . Melatonin 3 MG TABS Take 1 tablet by mouth at bedtime.    . Multiple Vitamins-Minerals (VITEYES AREDS FORMULA/LUTEIN) CAPS Take 1 capsule by mouth daily.     [name] omeprazole (PRILOSEC OTC) 20 MG tablet Take  20 mg by mouth daily as needed. For heartburn    . warfarin (COUMADIN) 2.5 MG tablet Take 1.25-2.5 mg by mouth daily. Take 1 tablet all days except Wednesday, take 1.25 mg    . [DISCONTINUED] pantoprazole (PROTONIX) 20 MG tablet Take 1 tablet (20 mg total) by mouth daily. 30 tablet 11  . [DISCONTINUED] pravastatin (PRAVACHOL) 80 MG tablet Take 40 mg by mouth Daily.     . [DISCONTINUED] spironolactone (ALDACTONE) 25 MG tablet Take 1/2 tablet by mouth once daily     No current facility-administered medications for this visit.    Allergies:    Dabigatran and Sulfonamide derivatives   Social History: History   Social History  . Marital Status: Widowed    Spouse Name: N/A  . Number of Children: N/A  . Years of Education: N/A   Occupational History  . Not on file.   Social History Main Topics  . Smoking status: Never Smoker   . Smokeless tobacco: Never Used  . Alcohol Use: No  . Drug Use: No  . Sexual Activity: No   Other Topics Concern  . Not on file   Social History Narrative   She has two children, two grandchildren and three great grandchildren    Family History: Family History  Problem Relation Age of Onset  . Heart disease Father     Review of Systems: All other systems reviewed and are otherwise negative except as noted above.   Physical Exam: VS:  BP 130/68 mmHg  Pulse 81  Ht 5\' 2"  (1.575 m)  Wt 117 lb (53.071 kg)  BMI 21.39 kg/m2 , BMI Body mass index is 21.39 kg/(m^2). Wt Readings from Last 3 Encounters:  02/01/15 117 lb (53.071 kg)  01/14/15 114 lb (51.71 kg)  01/04/15 118 lb 12.8 oz (53.887 kg)    GEN- The patient is elderly appearing, alert and oriented x 3 today.   HEENT: normocephalic, atraumatic; sclera clear, conjunctiva pink; hearing intact; oropharynx clear; neck supple  Lungs- Clear to ausculation bilaterally, normal work of breathing.  No wheezes, rales, rhonchi Heart- Regular rate and rhythm, no murmurs, rubs or gallops  GI- soft, non-tender, non-distended, bowel sounds present  Extremities- no clubbing, cyanosis, or edema; DP/PT/radial pulses 2+ bilaterally MS- no significant deformity or atrophy Skin- warm and dry, no rash or lesion  Psych- euthymic mood, full affect Neuro- strength and sensation are intact   EKG:  EKG is ordered today. The ekg ordered today shows 2:1 atrial tachycardia  Recent Labs: 11/02/2014: ALT 21; Magnesium 1.9; TSH 0.94 01/14/2015: BUN 17; Creatinine, Ser 1.08*; Hemoglobin 10.1*; Platelets 214; Potassium 4.1; Sodium 131*    Other studies  Reviewed: Additional studies/ records that were reviewed today include: Dr Olin Pia office notes, hospital records  Assessment and Plan: 1.  Atrial arrhythmias The patient has persistent atrial fibrillation as well as an atrial tachycardia With advanced age, she is not a candidate for ablation She feels that her symptoms are improved on Tikosyn and Cardizem - she is in persistent atrial tachycardia, will d/w Dr [name] need to continue Tikosyn Recent BMET stable Continue current therapy for now.  PPM/AVN ablation can be considered if she fails medical therapy Continue Warfarin for CHADS2VASC of at least 4  2.  HTN BP have been running lower at home than the patient easily tolerates with systolic 814-481.   Will discontinue Diovan today and follow   Current medicines are reviewed at length with the patient today.   The patient does not  have concerns regarding her medicines.  The following changes were made today: stop Diovan  Labs/ tests ordered today include: none    Disposition:   Follow up with me in 6 weeks   Signed, Chanetta Marshall, NP 02/01/2015 2:26 PM   [place] [address] [phone] (office) [phone] (fax)

## 2015-02-01 NOTE — Patient Instructions (Addendum)
Medication Instructions:   Your physician recommends that you continue on your current medications as directed. EXCEPT DIOVAN   STOP TAKING DIOVAN   Labwork:   Testing/Procedures:   Follow-Up:  Your physician recommends that you schedule a follow-up appointment in:  6 WEEKS WITH AMBER    Any Other Special Instructions Will Be Listed Below (If Applicable).

## 2015-02-16 DIAGNOSIS — I4891 Unspecified atrial fibrillation: Secondary | ICD-10-CM | POA: Diagnosis not present

## 2015-02-16 DIAGNOSIS — Z7901 Long term (current) use of anticoagulants: Secondary | ICD-10-CM | POA: Diagnosis not present

## 2015-02-22 ENCOUNTER — Telehealth: Payer: Self-pay | Admitting: Internal Medicine

## 2015-02-22 DIAGNOSIS — C44622 Squamous cell carcinoma of skin of right upper limb, including shoulder: Secondary | ICD-10-CM | POA: Diagnosis not present

## 2015-02-22 DIAGNOSIS — D485 Neoplasm of uncertain behavior of skin: Secondary | ICD-10-CM | POA: Diagnosis not present

## 2015-02-22 MED ORDER — LABETALOL HCL 100 MG PO TABS: 100.0000 mg | ORAL_TABLET | Freq: Two times a day (BID) | ORAL | Status: DC

## 2015-02-22 NOTE — Telephone Encounter (Signed)
New message    Pt needs new prescription for labetalol hcl 100mg  Pt states she has prescription for 200mg  tablet that she has been cutting in half and she is running out of the medication Please call to discuss

## 2015-02-22 NOTE — Telephone Encounter (Signed)
Requested rx sent to [facility] [address]. Patient is aware new tablets will be in 100 mg form.

## 2015-02-27 ENCOUNTER — Other Ambulatory Visit: Payer: Self-pay

## 2015-02-27 MED ORDER — LABETALOL HCL 100 MG PO TABS: 100.0000 mg | ORAL_TABLET | Freq: Two times a day (BID) | ORAL | Status: DC

## 2015-03-03 DIAGNOSIS — H04123 Dry eye syndrome of bilateral lacrimal glands: Secondary | ICD-10-CM | POA: Diagnosis not present

## 2015-03-03 DIAGNOSIS — H43393 Other vitreous opacities, bilateral: Secondary | ICD-10-CM | POA: Diagnosis not present

## 2015-03-03 DIAGNOSIS — H3531 Nonexudative age-related macular degeneration: Secondary | ICD-10-CM | POA: Diagnosis not present

## 2015-03-03 DIAGNOSIS — H40013 Open angle with borderline findings, low risk, bilateral: Secondary | ICD-10-CM | POA: Diagnosis not present

## 2015-03-14 NOTE — Progress Notes (Signed)
Electrophysiology Office Note Date: 03/15/2015  ID:  [name], DOB May 03, 1925, MRN [id number]  PCP: Thressa Sheller, MD Electrophysiologist: Caryl Comes  CC: follow up on atrial arrhythmias  [name] is a 81 y.o. female seen today for Dr Caryl Comes.  She has a history of atrial fibrillation and has been maintained on Tikosyn. She had recurrent atrial fibrillation and underwent unsuccessful cardioversion 01/13/15.  She was admitted and placed on Cardizem with improvement in heart rates.  PPM and AVN ablation were discussed with the patient during recent hospitalization but she preferred to try medical therapy first.  Since last being seen in clinic, she reports doing very well.  Her atrial arrhythmias have been under good control and she has had significant increase in energy level.  She does report occasional exertional chest tightness. She denies dyspnea, PND, orthopnea, nausea, vomiting, dizziness, syncope, edema, weight gain, or early satiety.  Past Medical History  Diagnosis Date  . Persistent atrial fibrillation        . First degree AV block   . SVT (supraventricular tachycardia)     possible atrial tachycardia, possible junctional tacycardia  . Hypertension   . Hypothyroidism   . Hyperlipidemia   . Gastroesophageal reflux disease   . Seasonal allergies   . Hx of echocardiogram     echo 07/2009:  mild focal basal septal hypertrophy, EF 55-60%, mild MR, mod LAE, mild RAE, PASP 45  . Squamous carcinoma 2016    "right leg; right jawline"  . History of hiatal hernia   . Arthritis     "back; hands" (01/13/2015)  . Anxiety    Past Surgical History  Procedure Laterality Date  . Laparoscopic bilateral salpingo oopherectomy Bilateral ~ 2004  . Knee arthroscopy Right 1990's  . Abdominal hysterectomy  ~ 1975  . Cardioversion  01/13/2015    "didn't work"  . Appendectomy  ~ 1975  . Cataract extraction w/ intraocular lens  implant, bilateral Bilateral 2011  . Dilation and  curettage of uterus    . Cardiac catheterization  1990's  . Squamous cell carcinoma excision  2016    "right leg; right jawline"  . Cardioversion N/A 01/13/2015    Procedure: CARDIOVERSION;  Surgeon: Josue Hector, MD;  Location: [facility] ENDOSCOPY;  Service: Cardiovascular;  Laterality: N/A;    Current Outpatient Prescriptions  Medication Sig Dispense Refill  . acetaminophen (TYLENOL) 500 MG tablet Take 500 mg by mouth every 8 (eight) hours as needed for mild pain or moderate pain.    . cholecalciferol (VITAMIN D) 1000 UNITS tablet Take 1,000 Units by mouth daily.    [name] diltiazem (CARDIZEM CD) 180 MG 24 hr capsule Take 1 capsule (180 mg total) by mouth daily. 30 capsule 3  . dofetilide (TIKOSYN) 125 MCG capsule TAKE 1 CAPSULE (125 MCG) BY MOUTH TWO TIMES DAILY AS DIRECTED. CONSULTMD BEFORE TAKING ANY NEW MEDICATIONS INCLUDING OTC/HERBAL PRODUCTS. 180 capsule 1  . fexofenadine (ALLEGRA) 180 MG tablet Take 180 mg by mouth daily as needed for allergies.     [name] labetalol (NORMODYNE) 100 MG tablet Take 1 tablet (100 mg total) by mouth 2 (two) times daily. 180 tablet 2  . levothyroxine (SYNTHROID, LEVOTHROID) 50 MCG tablet Take 50 mcg by mouth daily.      . Melatonin 3 MG TABS Take 1 tablet by mouth at bedtime.    . Multiple Vitamins-Minerals (VITEYES AREDS FORMULA/LUTEIN) CAPS Take 1 capsule by mouth daily.     [name] omeprazole (PRILOSEC OTC) 20 MG  tablet Take 20 mg by mouth daily as needed. For heartburn    . warfarin (COUMADIN) 2.5 MG tablet Take 1.25-2.5 mg by mouth daily. Take 1 tablet all days except Wednesday, take 1.25 mg    . [DISCONTINUED] pantoprazole (PROTONIX) 20 MG tablet Take 1 tablet (20 mg total) by mouth daily. 30 tablet 11  . [DISCONTINUED] pravastatin (PRAVACHOL) 80 MG tablet Take 40 mg by mouth Daily.     . [DISCONTINUED] spironolactone (ALDACTONE) 25 MG tablet Take 1/2 tablet by mouth once daily     No current facility-administered medications for this visit.    Allergies:    Dabigatran and Sulfonamide derivatives   Social History: Social History   Social History  . Marital Status: Widowed    Spouse Name: N/A  . Number of Children: N/A  . Years of Education: N/A   Occupational History  . Not on file.   Social History Main Topics  . Smoking status: Never Smoker   . Smokeless tobacco: Never Used  . Alcohol Use: No  . Drug Use: No  . Sexual Activity: No   Other Topics Concern  . Not on file   Social History Narrative   She has two children, two grandchildren and three great grandchildren    Family History: Family History  Problem Relation Age of Onset  . Heart disease Father     Review of Systems: All other systems reviewed and are otherwise negative except as noted above.   Physical Exam: VS:  BP 146/70 mmHg  Pulse 73  Ht 5\' 2"  (1.575 m)  Wt 113 lb (51.256 kg)  BMI 20.66 kg/m2 , BMI Body mass index is 20.66 kg/(m^2). Wt Readings from Last 3 Encounters:  03/15/15 113 lb (51.256 kg)  02/01/15 117 lb (53.071 kg)  01/14/15 114 lb (51.71 kg)    GEN- The patient is elderly appearing, alert and oriented x 3 today.   HEENT: normocephalic, atraumatic; sclera clear, conjunctiva pink; hearing intact; oropharynx clear; neck supple  Lungs- Clear to ausculation bilaterally, normal work of breathing.  No wheezes, rales, rhonchi Heart- Regular rate and rhythm, no murmurs, rubs or gallops  GI- soft, non-tender, non-distended, bowel sounds present  Extremities- no clubbing, cyanosis, or edema; DP/PT/radial pulses 2+ bilaterally MS- no significant deformity or atrophy Skin- warm and dry, no rash or lesion  Psych- euthymic mood, full affect Neuro- strength and sensation are intact   EKG:  EKG is not ordered today.  Recent Labs: 11/02/2014: ALT 21; Magnesium 1.9; TSH 0.94 01/14/2015: BUN 17; Creatinine, Ser 1.08*; Hemoglobin 10.1*; Platelets 214; Potassium 4.1; Sodium 131*    Other studies Reviewed: Additional studies/ records that were  reviewed today include: Dr Olin Pia office notes, hospital records  Assessment and Plan: 1.  Atrial arrhythmias The patient has persistent atrial fibrillation as well as an atrial tachycardia With advanced age, she is not a candidate for ablation She feels that her symptoms are improved on Tikosyn and Cardizem. Continue current therapy for now.  PPM/AVN ablation can be considered if she fails medical therapy Continue Warfarin for CHADS2VASC of at least 4 Recent BMET, QTc stable  2.  HTN Stable No change required today  3.  Chest tightness She has been having occasional chest tightness with exertion that relieves with rest.  She has not had an ischemic evaluation in years but with advanced age, she prefers to not be too aggressive. Will schedule GXT to evaluate for ischemic changes as well as chronotropic competence  Current medicines are  reviewed at length with the patient today.   The patient does not have concerns regarding her medicines.  The following changes were made today: none  Labs/ tests ordered today include: GXT    Disposition:   Follow up with Dr Caryl Comes in 3 months   Signed, Chanetta Marshall, NP 03/15/2015 1:22 PM   [place] [address] [place] [address] [phone] (office) [phone] (fax)

## 2015-03-15 ENCOUNTER — Ambulatory Visit (INDEPENDENT_AMBULATORY_CARE_PROVIDER_SITE_OTHER): Payer: Medicare Other | Admitting: Nurse Practitioner

## 2015-03-15 ENCOUNTER — Encounter: Payer: Self-pay | Admitting: Nurse Practitioner

## 2015-03-15 VITALS — BP 146/70 | HR 73 | Ht 62.0 in | Wt 116.0 lb

## 2015-03-15 DIAGNOSIS — R0789 Other chest pain: Secondary | ICD-10-CM | POA: Diagnosis not present

## 2015-03-15 DIAGNOSIS — I4819 Other persistent atrial fibrillation: Secondary | ICD-10-CM

## 2015-03-15 DIAGNOSIS — I481 Persistent atrial fibrillation: Secondary | ICD-10-CM

## 2015-03-15 DIAGNOSIS — I10 Essential (primary) hypertension: Secondary | ICD-10-CM | POA: Diagnosis not present

## 2015-03-15 NOTE — Patient Instructions (Addendum)
Medication Instructions:  Your physician recommends that you continue on your current medications as directed. Please refer to the Current Medication list given to you today.    Labwork:  NONE ORDER TODAY   Testing/Procedures:  Your physician has requested that you have an exercise tolerance test. For further information please visit [email or web address]. Please also follow instruction sheet, as given.   Follow-Up:  3 months with DR Caryl Comes  Any Other Special Instructions Will Be Listed Below (If Applicable).

## 2015-03-17 DIAGNOSIS — C44622 Squamous cell carcinoma of skin of right upper limb, including shoulder: Secondary | ICD-10-CM | POA: Diagnosis not present

## 2015-03-23 DIAGNOSIS — Z7901 Long term (current) use of anticoagulants: Secondary | ICD-10-CM | POA: Diagnosis not present

## 2015-03-23 DIAGNOSIS — I4891 Unspecified atrial fibrillation: Secondary | ICD-10-CM | POA: Diagnosis not present

## 2015-03-24 ENCOUNTER — Encounter (INDEPENDENT_AMBULATORY_CARE_PROVIDER_SITE_OTHER): Payer: BLUE CROSS/BLUE SHIELD | Admitting: Ophthalmology

## 2015-04-06 ENCOUNTER — Other Ambulatory Visit: Payer: Self-pay | Admitting: Internal Medicine

## 2015-04-06 ENCOUNTER — Telehealth: Payer: Self-pay | Admitting: Internal Medicine

## 2015-04-06 NOTE — Telephone Encounter (Signed)
New message      Pt states her heart is out of rhythm.  Please call

## 2015-04-06 NOTE — Telephone Encounter (Signed)
Patient complains of AFib keeping her awake at night.  States she can't sleep and then she is tired all day because she can't sleep at night.  She would like to discuss with physician.  She denies any other symptoms beside being kept awake and that it hurts. She is aware scheduler will call her to arrange OV w/ NP or MD in near future.

## 2015-04-10 ENCOUNTER — Encounter (INDEPENDENT_AMBULATORY_CARE_PROVIDER_SITE_OTHER): Payer: Medicare Other | Admitting: Ophthalmology

## 2015-04-11 ENCOUNTER — Encounter (HOSPITAL_COMMUNITY): Payer: Self-pay | Admitting: Nurse Practitioner

## 2015-04-11 ENCOUNTER — Ambulatory Visit (HOSPITAL_COMMUNITY)
Admission: RE | Admit: 2015-04-11 | Discharge: 2015-04-11 | Disposition: A | Discharge status: Home / Self Care | Payer: Medicare Other | Source: Ambulatory Visit | Attending: Nurse Practitioner | Admitting: Nurse Practitioner

## 2015-04-11 VITALS — BP 124/66 | HR 69 | Ht 63.0 in | Wt 115.4 lb

## 2015-04-11 DIAGNOSIS — I481 Persistent atrial fibrillation: Secondary | ICD-10-CM

## 2015-04-11 DIAGNOSIS — I4819 Other persistent atrial fibrillation: Secondary | ICD-10-CM

## 2015-04-11 LAB — BASIC METABOLIC PANEL
Anion gap: 6 (ref 5–15)
BUN: 18 mg/dL (ref 6–20)
CHLORIDE: 105 mmol/L (ref 101–111)
CO2: 27 mmol/L (ref 22–32)
Calcium: 9.8 mg/dL (ref 8.9–10.3)
Creatinine, Ser: 1.06 mg/dL — ABNORMAL HIGH (ref 0.44–1.00)
GFR calc non Af Amer: 45 mL/min — ABNORMAL LOW (ref >60)
GFR, EST AFRICAN AMERICAN: 53 mL/min — AB (ref >60)
Glucose, Bld: 94 mg/dL (ref 65–99)
Potassium: 4.7 mmol/L (ref 3.5–5.1)
SODIUM: 138 mmol/L (ref 135–145)

## 2015-04-11 LAB — CBC
HEMATOCRIT: 36.5 % (ref 36.0–46.0)
HEMOGLOBIN: 11.5 g/dL — AB (ref 12.0–15.0)
MCH: 25.4 pg — ABNORMAL LOW (ref 26.0–34.0)
MCHC: 31.5 g/dL (ref 30.0–36.0)
MCV: 80.8 fL (ref 78.0–100.0)
Platelets: 202 10*3/uL (ref 150–400)
RBC: 4.52 MIL/uL (ref 3.87–5.11)
RDW: 14.3 % (ref 11.5–15.5)
WBC: 5.3 10*3/uL (ref 4.0–10.5)

## 2015-04-11 LAB — MAGNESIUM: MAGNESIUM: 2 mg/dL (ref 1.7–2.4)

## 2015-04-11 MED ORDER — DILTIAZEM HCL 30 MG PO TABS: ORAL_TABLET | ORAL | Status: DC

## 2015-04-11 NOTE — Progress Notes (Signed)
Patient ID: [name], female   DOB: 05/20/1925, 81 y.o.   MRN: [id number]     Primary Care Physician: Thressa Sheller, MD Referring Physician: Dr. Olin Pia office   [name] is a 81 y.o. female with a h/o afib and has been on tikosyn for maintenance of afib. She did undergo unsuccessful cardioversion 01/13/15.  She was admitted and underwent improvement in heart rates with IV cardizem. It was discussed at that time if tikosyn was not effective to keep her in rhythm then AVN ablation and PPM would be an option, but she preferred medical management.   She is in the afib clinic today to be further evaluated for irregular heart beat at night that keeps her awake. It does not happen every night. EKG today show aflutter with variable rate with controlled v rates at 69 bpm. She feels well today. She becomes uncomfortable when heart rate is up to the 90/100 bpm range. She continues on tikosyn and warfarin.   Today, she denies symptoms of palpitations, chest pain, shortness of breath, orthopnea, PND, lower extremity edema, dizziness, presyncope, syncope, or neurologic sequela. The patient is tolerating medications without difficulties and is otherwise without complaint today.   Past Medical History  Diagnosis Date  . Persistent atrial fibrillation        . First degree AV block   . SVT (supraventricular tachycardia)     possible atrial tachycardia, possible junctional tacycardia  . Hypertension   . Hypothyroidism   . Hyperlipidemia   . Gastroesophageal reflux disease   . Seasonal allergies   . Hx of echocardiogram     echo 07/2009:  mild focal basal septal hypertrophy, EF 55-60%, mild MR, mod LAE, mild RAE, PASP 45  . Squamous carcinoma 2016    "right leg; right jawline"  . History of hiatal hernia   . Arthritis     "back; hands" (01/13/2015)  . Anxiety    Past Surgical History  Procedure Laterality Date  . Laparoscopic bilateral salpingo oopherectomy Bilateral ~ 2004  . Knee  arthroscopy Right 1990's  . Abdominal hysterectomy  ~ 1975  . Cardioversion  01/13/2015    "didn't work"  . Appendectomy  ~ 1975  . Cataract extraction w/ intraocular lens  implant, bilateral Bilateral 2011  . Dilation and curettage of uterus    . Cardiac catheterization  1990's  . Squamous cell carcinoma excision  2016    "right leg; right jawline"  . Cardioversion N/A 01/13/2015    Procedure: CARDIOVERSION;  Surgeon: Josue Hector, MD;  Location: [facility] ENDOSCOPY;  Service: Cardiovascular;  Laterality: N/A;    Current Outpatient Prescriptions  Medication Sig Dispense Refill  . acetaminophen (TYLENOL) 500 MG tablet Take 500 mg by mouth every 8 (eight) hours as needed for mild pain or moderate pain.    . cholecalciferol (VITAMIN D) 1000 UNITS tablet Take 1,000 Units by mouth daily.    [name] diltiazem (CARDIZEM CD) 180 MG 24 hr capsule Take 1 capsule (180 mg total) by mouth daily. 30 capsule 3  . fexofenadine (ALLEGRA) 180 MG tablet Take 180 mg by mouth daily as needed for allergies.     [name] labetalol (NORMODYNE) 100 MG tablet Take 1 tablet (100 mg total) by mouth 2 (two) times daily. 180 tablet 2  . levothyroxine (SYNTHROID, LEVOTHROID) 50 MCG tablet Take 50 mcg by mouth daily.      . Melatonin 3 MG TABS Take 1 tablet by mouth at bedtime.    . Multiple Vitamins-Minerals (VITEYES  AREDS FORMULA/LUTEIN) CAPS Take 1 capsule by mouth daily.     [name] omeprazole (PRILOSEC OTC) 20 MG tablet Take 20 mg by mouth daily as needed. For heartburn    . TIKOSYN 125 MCG capsule TAKE 1 CAPSULE BY MOUTH TWICE A DAY AS DIRECTED 180 capsule 0  . warfarin (COUMADIN) 2.5 MG tablet Take 2.5 mg by mouth daily.     [name] diltiazem (CARDIZEM) 30 MG tablet Take 1/2 - 1 tablet every 4 hours AS NEEDED for heart rate >100 as long as blood pressure >100 30 tablet 1  . [DISCONTINUED] pantoprazole (PROTONIX) 20 MG tablet Take 1 tablet (20 mg total) by mouth daily. 30 tablet 11  . [DISCONTINUED] pravastatin (PRAVACHOL) 80 MG tablet Take 40  mg by mouth Daily.     . [DISCONTINUED] spironolactone (ALDACTONE) 25 MG tablet Take 1/2 tablet by mouth once daily     No current facility-administered medications for this encounter.    Allergies  Allergen Reactions  . Dabigatran     Unknown, per pt   . Sulfonamide Derivatives     REACTION: hives    Social History   Social History  . Marital Status: Widowed    Spouse Name: N/A  . Number of Children: N/A  . Years of Education: N/A   Occupational History  . Not on file.   Social History Main Topics  . Smoking status: Never Smoker   . Smokeless tobacco: Never Used  . Alcohol Use: No  . Drug Use: No  . Sexual Activity: No   Other Topics Concern  . Not on file   Social History Narrative   She has two children, two grandchildren and three great grandchildren    Family History  Problem Relation Age of Onset  . Heart disease Father     ROS- All systems are reviewed and negative except as per the HPI above  Physical Exam: Filed Vitals:   04/11/15 1029  BP: 124/66  Pulse: 69  Height: 5\' 3"  (1.6 m)  Weight: 115 lb 6.4 oz (52.345 kg)    GEN- The patient is well appearing, alert and oriented x 3 today.   Head- normocephalic, atraumatic Eyes-  Sclera clear, conjunctiva pink Ears- hearing intact Oropharynx- clear Neck- supple, no JVP Lymph- no cervical lymphadenopathy Lungs- Clear to ausculation bilaterally, normal work of breathing Heart- Regular rate and rhythm, no murmurs, rubs or gallops, PMI not laterally displaced GI- soft, NT, ND, + BS Extremities- no clubbing, cyanosis, or edema MS- no significant deformity or atrophy Skin- no rash or lesion Psych- euthymic mood, full affect Neuro- strength and sensation are intact  EKG- Atrial flutter with variable AV block, QRS int 78 ms, QRS 78 ms, QTc 458 ms. Epic records reviewed.   Assessment and Plan:  1. Afib/flutter  Frequent break though with tikosyn, failure of dccv in June For now continue with  tikosyn/ diltiazem 180 mg qd/ labetolol 100 mg bid but may need discussion when she sees Dr. Caryl Comes in November if  drug worth continuing. Discussed short acting cardizem 30 mg tablet, to use 1/2 tab to full tab if HR is elevated round 517 and if BP systolic is also around 001. Pt is in favor of this because her elevated heart rate usually bothers more at night than the day and she can not sleep. Continue warfarin  F/u afib clinic in 3 weeks Dr. Caryl Comes in November as scheduled  Geroge Baseman. Pape Parson, [facility] [address]  [place], [place] [address] [phone]

## 2015-04-11 NOTE — Patient Instructions (Addendum)
Your physician has recommended you make the following change in your medication:  1)Cardizem 30mg  -- take 1/2 to 1 tablet every 4 hours AS NEEDED for heart rate >100 as long as blood pressure >100.  Parking code for October 0900 -- heart and vascular parking garage

## 2015-04-13 ENCOUNTER — Encounter (INDEPENDENT_AMBULATORY_CARE_PROVIDER_SITE_OTHER): Payer: Medicare Other | Admitting: Ophthalmology

## 2015-04-14 ENCOUNTER — Ambulatory Visit: Payer: Medicare Other | Admitting: Physician Assistant

## 2015-04-20 ENCOUNTER — Encounter (INDEPENDENT_AMBULATORY_CARE_PROVIDER_SITE_OTHER): Payer: Medicare Other | Admitting: Ophthalmology

## 2015-04-20 DIAGNOSIS — H3531 Nonexudative age-related macular degeneration: Secondary | ICD-10-CM | POA: Diagnosis not present

## 2015-04-20 DIAGNOSIS — I10 Essential (primary) hypertension: Secondary | ICD-10-CM

## 2015-04-20 DIAGNOSIS — H43813 Vitreous degeneration, bilateral: Secondary | ICD-10-CM

## 2015-04-20 DIAGNOSIS — H35033 Hypertensive retinopathy, bilateral: Secondary | ICD-10-CM

## 2015-04-25 DIAGNOSIS — I4891 Unspecified atrial fibrillation: Secondary | ICD-10-CM | POA: Diagnosis not present

## 2015-04-25 DIAGNOSIS — Z7901 Long term (current) use of anticoagulants: Secondary | ICD-10-CM | POA: Diagnosis not present

## 2015-05-02 ENCOUNTER — Ambulatory Visit (HOSPITAL_COMMUNITY): Payer: Medicare Other | Admitting: Nurse Practitioner

## 2015-05-04 ENCOUNTER — Encounter (HOSPITAL_COMMUNITY): Payer: Self-pay | Admitting: Nurse Practitioner

## 2015-05-04 ENCOUNTER — Ambulatory Visit (HOSPITAL_COMMUNITY)
Admission: RE | Admit: 2015-05-04 | Discharge: 2015-05-04 | Disposition: A | Discharge status: Home / Self Care | Payer: Medicare Other | Source: Ambulatory Visit | Attending: Nurse Practitioner | Admitting: Nurse Practitioner

## 2015-05-04 VITALS — BP 154/86 | HR 85 | Ht 62.0 in | Wt 118.2 lb

## 2015-05-04 DIAGNOSIS — I481 Persistent atrial fibrillation: Secondary | ICD-10-CM | POA: Diagnosis not present

## 2015-05-04 DIAGNOSIS — I4819 Other persistent atrial fibrillation: Secondary | ICD-10-CM

## 2015-05-04 NOTE — Progress Notes (Signed)
Patient ID: [name], female   DOB: 1925-02-05, 81 y.o.   MRN: [id number]     Primary Care Physician: Thressa Sheller, MD Referring Physician: Dr. Purvis Sheffield Naab is a 81 y.o. female with a h/o afib and has been on tikosyn x 4 years and had been keeping in rhythm until just recently. She did undergo unsuccessful cardioversion 01/13/15.  She was admitted and underwent improvement in heart rates with IV cardizem.  It was discussed at that time if tikosyn was not effective to keep her in rhythm then AVN ablation and PPM would be an option, but she preferred medical management.   She was seen in the afib clinic 1-2 weeks ago and found to be in aflutter with variable block. She c/o irregular heart beat at night that kept her awake, otherwise was tolerating ok but has noticed more fatigue. It does not happen every night.  She is in afib today, rate controlled and is aware that she is in afib. She becomes uncomfortable when heart rate is up to the 90/100 bpm range. She continues on tikosyn and warfarin. She was prescribed short acting cardizem on last visit here and has used 1-2 times and did feel better after using when her heart rate was around 100. Bmet/mag checked on last visit and were in range. TSH checked in April and in range.  Today, she denies symptoms of palpitations, chest pain, shortness of breath, orthopnea, PND, lower extremity edema, dizziness, presyncope, syncope, or neurologic sequela. The patient is tolerating medications without difficulties and is otherwise without complaint today.   Past Medical History  Diagnosis Date  . Persistent atrial fibrillation ([place])        . First degree AV block   . SVT (supraventricular tachycardia) (HCC)     possible atrial tachycardia, possible junctional tacycardia  . Hypertension   . Hypothyroidism   . Hyperlipidemia   . Gastroesophageal reflux disease   . Seasonal allergies   . Hx of echocardiogram     echo 07/2009:  mild focal  basal septal hypertrophy, EF 55-60%, mild MR, mod LAE, mild RAE, PASP 45  . Squamous carcinoma ([place]) 2016    "right leg; right jawline"  . History of hiatal hernia   . Arthritis     "back; hands" (01/13/2015)  . Anxiety    Past Surgical History  Procedure Laterality Date  . Laparoscopic bilateral salpingo oopherectomy Bilateral ~ 2004  . Knee arthroscopy Right 1990's  . Abdominal hysterectomy  ~ 1975  . Cardioversion  01/13/2015    "didn't work"  . Appendectomy  ~ 1975  . Cataract extraction w/ intraocular lens  implant, bilateral Bilateral 2011  . Dilation and curettage of uterus    . Cardiac catheterization  1990's  . Squamous cell carcinoma excision  2016    "right leg; right jawline"  . Cardioversion N/A 01/13/2015    Procedure: CARDIOVERSION;  Surgeon: Josue Hector, MD;  Location: [facility] ENDOSCOPY;  Service: Cardiovascular;  Laterality: N/A;    Current Outpatient Prescriptions  Medication Sig Dispense Refill  . acetaminophen (TYLENOL) 500 MG tablet Take 500 mg by mouth every 8 (eight) hours as needed for mild pain or moderate pain.    . cholecalciferol (VITAMIN D) 1000 UNITS tablet Take 1,000 Units by mouth daily.    [name] diltiazem (CARDIZEM CD) 180 MG 24 hr capsule Take 1 capsule (180 mg total) by mouth daily. 30 capsule 3  . diltiazem (CARDIZEM) 30 MG tablet Take 1/2 -  1 tablet every 4 hours AS NEEDED for heart rate >100 as long as blood pressure >100 30 tablet 1  . fexofenadine (ALLEGRA) 180 MG tablet Take 180 mg by mouth daily as needed for allergies.     [name] labetalol (NORMODYNE) 100 MG tablet Take 1 tablet (100 mg total) by mouth 2 (two) times daily. 180 tablet 2  . levothyroxine (SYNTHROID, LEVOTHROID) 50 MCG tablet Take 50 mcg by mouth daily.      . Melatonin 3 MG TABS Take 1 tablet by mouth at bedtime.    . Multiple Vitamins-Minerals (VITEYES AREDS FORMULA/LUTEIN) CAPS Take 1 capsule by mouth daily.     [name] omeprazole (PRILOSEC OTC) 20 MG tablet Take 20 mg by mouth daily as  needed. For heartburn    . TIKOSYN 125 MCG capsule TAKE 1 CAPSULE BY MOUTH TWICE A DAY AS DIRECTED 180 capsule 0  . warfarin (COUMADIN) 2.5 MG tablet Take 2.5 mg by mouth daily.     . [DISCONTINUED] pantoprazole (PROTONIX) 20 MG tablet Take 1 tablet (20 mg total) by mouth daily. 30 tablet 11  . [DISCONTINUED] pravastatin (PRAVACHOL) 80 MG tablet Take 40 mg by mouth Daily.     . [DISCONTINUED] spironolactone (ALDACTONE) 25 MG tablet Take 1/2 tablet by mouth once daily     No current facility-administered medications for this encounter.    Allergies  Allergen Reactions  . Dabigatran     Unknown, per pt   . Sulfonamide Derivatives     REACTION: hives    Social History   Social History  . Marital Status: Widowed    Spouse Name: N/A  . Number of Children: N/A  . Years of Education: N/A   Occupational History  . Not on file.   Social History Main Topics  . Smoking status: Never Smoker   . Smokeless tobacco: Never Used  . Alcohol Use: No  . Drug Use: No  . Sexual Activity: No   Other Topics Concern  . Not on file   Social History Narrative   She has two children, two grandchildren and three great grandchildren    Family History  Problem Relation Age of Onset  . Heart disease Father     ROS- All systems are reviewed and negative except as per the HPI above  Physical Exam: Filed Vitals:   05/04/15 1133  BP: 154/86  Pulse: 85  Height: 5\' 2"  (1.575 m)  Weight: 118 lb 3.2 oz (53.615 kg)    GEN- The patient is well appearing, alert and oriented x 3 today.   Head- normocephalic, atraumatic Eyes-  Sclera clear, conjunctiva pink Ears- hearing intact Oropharynx- clear Neck- supple, no JVP Lymph- no cervical lymphadenopathy Lungs- Clear to ausculation bilaterally, normal work of breathing Heart- Regular rate and rhythm, no murmurs, rubs or gallops, PMI not laterally displaced GI- soft, NT, ND, + BS Extremities- no clubbing, cyanosis, or edema MS- no significant  deformity or atrophy Skin- no rash or lesion Psych- euthymic mood, full affect Neuro- strength and sensation are intact  EKG- Atrial fibrillation at 85 bpm, qrs int 72 bpm, QTc 464 ms. Epic records reviewed.   Assessment and Plan:  1. Afib/flutter Frequent break though with tikosyn, failure of dccv in June For now continue with tikosyn/ diltiazem 180 mg qd/ labetolol 100 mg bid but  needs discussion when she sees Dr. Caryl Comes  November 7th if  drug worth continuing. Continue cardizem 30 mg tablet, to use 1/2 tab to full tab if HR is  elevated round 425 and if BP systolic is also around 956.  Continue warfarin  Dr. Caryl Comes in November as scheduled afib clinic as needed  Geroge Baseman. Jeremyah Jelley, [facility] [address] [place], [place] [address] [phone]

## 2015-05-19 DIAGNOSIS — E559 Vitamin D deficiency, unspecified: Secondary | ICD-10-CM | POA: Diagnosis not present

## 2015-05-19 DIAGNOSIS — R5383 Other fatigue: Secondary | ICD-10-CM | POA: Diagnosis not present

## 2015-05-19 DIAGNOSIS — Z7901 Long term (current) use of anticoagulants: Secondary | ICD-10-CM | POA: Diagnosis not present

## 2015-05-19 DIAGNOSIS — E7800 Pure hypercholesterolemia, unspecified: Secondary | ICD-10-CM | POA: Diagnosis not present

## 2015-05-19 DIAGNOSIS — Z79899 Other long term (current) drug therapy: Secondary | ICD-10-CM | POA: Diagnosis not present

## 2015-05-19 DIAGNOSIS — I129 Hypertensive chronic kidney disease with stage 1 through stage 4 chronic kidney disease, or unspecified chronic kidney disease: Secondary | ICD-10-CM | POA: Diagnosis not present

## 2015-05-19 DIAGNOSIS — D649 Anemia, unspecified: Secondary | ICD-10-CM | POA: Diagnosis not present

## 2015-05-19 DIAGNOSIS — I10 Essential (primary) hypertension: Secondary | ICD-10-CM | POA: Diagnosis not present

## 2015-05-25 DIAGNOSIS — N183 Chronic kidney disease, stage 3 (moderate): Secondary | ICD-10-CM | POA: Diagnosis not present

## 2015-05-25 DIAGNOSIS — I4891 Unspecified atrial fibrillation: Secondary | ICD-10-CM | POA: Diagnosis not present

## 2015-05-25 DIAGNOSIS — I129 Hypertensive chronic kidney disease with stage 1 through stage 4 chronic kidney disease, or unspecified chronic kidney disease: Secondary | ICD-10-CM | POA: Diagnosis not present

## 2015-05-25 DIAGNOSIS — Z23 Encounter for immunization: Secondary | ICD-10-CM | POA: Diagnosis not present

## 2015-05-25 DIAGNOSIS — E785 Hyperlipidemia, unspecified: Secondary | ICD-10-CM | POA: Diagnosis not present

## 2015-05-29 ENCOUNTER — Other Ambulatory Visit: Payer: Self-pay

## 2015-05-29 MED ORDER — DILTIAZEM HCL ER COATED BEADS 180 MG PO CP24: 180.0000 mg | ORAL_CAPSULE | Freq: Every day | ORAL | Status: DC

## 2015-06-05 ENCOUNTER — Encounter: Payer: Self-pay | Admitting: Internal Medicine

## 2015-06-05 ENCOUNTER — Ambulatory Visit (INDEPENDENT_AMBULATORY_CARE_PROVIDER_SITE_OTHER): Payer: Medicare Other | Admitting: Internal Medicine

## 2015-06-05 VITALS — BP 122/70 | HR 85 | Ht 62.0 in | Wt 116.0 lb

## 2015-06-05 DIAGNOSIS — I4819 Other persistent atrial fibrillation: Secondary | ICD-10-CM

## 2015-06-05 DIAGNOSIS — I481 Persistent atrial fibrillation: Secondary | ICD-10-CM

## 2015-06-05 MED ORDER — AMIODARONE HCL 400 MG PO TABS: ORAL_TABLET | ORAL | Status: DC

## 2015-06-05 NOTE — Progress Notes (Signed)
Patient Care Team: Thressa Sheller, MD as PCP - General (Internal Medicine)   HPI  [name] is a 81 y.o. female  seen in followup for paroxysmal atrial fibrillation for which she takes Tikosyn.    She underwent unsuccessful cardioversion 7/16 and has been seen in the atrial fibrillation clinic a number of times over the last couple of months. She has had recurrence of atrial flutter as well as fibrillation. These have been quite problematic wise.  Possibility we should probably today so that we know that they are performing exposure to the drug been raised about AV junction ablation and pacing   She has some good days and some bad days. There have been many more bad days of late. She is quite aware when she is in flutter and or fibrillation. She notes her heart rate is quite discombobulated. This correlates with her bad days.    She is not previously been exposed to amiodarone.      Past Medical History  Diagnosis Date  . Persistent atrial fibrillation ([place])        . First degree AV block   . SVT (supraventricular tachycardia) (HCC)     possible atrial tachycardia, possible junctional tacycardia  . Hypertension   . Hypothyroidism   . Hyperlipidemia   . Gastroesophageal reflux disease   . Seasonal allergies   . Hx of echocardiogram     echo 07/2009:  mild focal basal septal hypertrophy, EF 55-60%, mild MR, mod LAE, mild RAE, PASP 45  . Squamous carcinoma ([place]) 2016    "right leg; right jawline"  . History of hiatal hernia   . Arthritis     "back; hands" (01/13/2015)  . Anxiety     Past Surgical History  Procedure Laterality Date  . Laparoscopic bilateral salpingo oopherectomy Bilateral ~ 2004  . Knee arthroscopy Right 1990's  . Abdominal hysterectomy  ~ 1975  . Cardioversion  01/13/2015    "didn't work"  . Appendectomy  ~ 1975  . Cataract extraction w/ intraocular lens  implant, bilateral Bilateral 2011  . Dilation and curettage of uterus    . Cardiac catheterization   1990's  . Squamous cell carcinoma excision  2016    "right leg; right jawline"  . Cardioversion N/A 01/13/2015    Procedure: CARDIOVERSION;  Surgeon: Josue Hector, MD;  Location: [facility] ENDOSCOPY;  Service: Cardiovascular;  Laterality: N/A;    Current Outpatient Prescriptions  Medication Sig Dispense Refill  . acetaminophen (TYLENOL) 500 MG tablet Take 500 mg by mouth every 8 (eight) hours as needed for mild pain or moderate pain.    . cholecalciferol (VITAMIN D) 1000 UNITS tablet Take 1,000 Units by mouth daily.    [name] diltiazem (CARDIZEM CD) 180 MG 24 hr capsule Take 1 capsule (180 mg total) by mouth daily. 30 capsule 11  . diltiazem (CARDIZEM) 30 MG tablet Take 1/2 - 1 tablet every 4 hours AS NEEDED for heart rate >100 as long as blood pressure >100 30 tablet 1  . fexofenadine (ALLEGRA) 180 MG tablet Take 180 mg by mouth daily as needed for allergies.     [name] labetalol (NORMODYNE) 100 MG tablet Take 1 tablet (100 mg total) by mouth 2 (two) times daily. 180 tablet 2  . levothyroxine (SYNTHROID, LEVOTHROID) 50 MCG tablet Take 50 mcg by mouth daily.      . Melatonin 3 MG TABS Take 1 tablet by mouth at bedtime.    . Multiple Vitamins-Minerals (VITEYES AREDS FORMULA/LUTEIN) CAPS Take  1 capsule by mouth daily.     [name] omeprazole (PRILOSEC OTC) 20 MG tablet Take 20 mg by mouth daily as needed. For heartburn    . TIKOSYN 125 MCG capsule TAKE 1 CAPSULE BY MOUTH TWICE A DAY AS DIRECTED 180 capsule 0  . warfarin (COUMADIN) 2.5 MG tablet Take 2.5 mg by mouth daily.     . [DISCONTINUED] pantoprazole (PROTONIX) 20 MG tablet Take 1 tablet (20 mg total) by mouth daily. 30 tablet 11  . [DISCONTINUED] pravastatin (PRAVACHOL) 80 MG tablet Take 40 mg by mouth Daily.     . [DISCONTINUED] spironolactone (ALDACTONE) 25 MG tablet Take 1/2 tablet by mouth once daily     No current facility-administered medications for this visit.    Allergies  Allergen Reactions  . Dabigatran     Unknown, per pt   . Sulfonamide  Derivatives     REACTION: hives    Review of Systems negative except from HPI and PMH  PE BP 122/70 mmHg  Pulse 85  Ht 5\' 2"  (1.575 m)  Wt 116 lb (52.617 kg)  BMI 21.21 kg/m2 Well developed and well nourished in no acute distress HENT normal E scleral and icterus clear Neck Supple JVP flat; carotids brisk and full Clear to ausculation  Irregularly irregular    rhythm, no murmurs gallops or rub Soft with active bowel sounds No clubbing cyanosis n Edema Alert and oriented, grossly normal motor and sensory function Skin Warm and Dry     ECG  was ordered today and demonstrated atrial flutter   with variable conduction. Rate is 85 Intervals-/08/40   Assessment an  Plan  Atrial fibrillation-paroxysmal  Atrial tachycardia -recurrent   Hypertensive urgency  Fatigue  The patient is having symptomatic recurrences of atrial arrhythmia. We have discussed options including the use of amiodarone as well as catheter ablation AV junction ablation or doing nothing. Given the significance of her symptoms, we elected to pursue the former. We reviewed side effects of amiodarone.  We will begin her on amiodarone 400 mg twice daily 2 weeks then 400 mg daily. Will anticipate cardioversion 3-4 weeks. We will check thyroid and liver function tests today as well as schedule DLCO

## 2015-06-05 NOTE — Patient Instructions (Addendum)
Medication Instructions: 1) Stop Tikosyn 2) Start amiodarone 400 mg one tablet by mouth twice daily x 2 weeks, Then decrease amiodarone to 400 mg one tablet by mouth once daily x 2 weeks  Labwork: - pending Cardioversion date  Procedures/Testing: - Your physician has recommended that you have a Cardioversion (DCCV)- in 3-4 weeks. Electrical Cardioversion uses a jolt of electricity to your heart either through paddles or wired patches attached to your chest. This is a controlled, usually prescheduled, procedure. Defibrillation is done under light anesthesia in the hospital, and you usually go home the day of the procedure. This is done to get your heart back into a normal rhythm. You are not awake for the procedure. Please see the instruction sheet given to you today.  - Your physician has recommended that you have a pulmonary function test. Pulmonary Function Tests are a group of tests that measure how well air moves in and out of your lungs.  Follow-Up: - pending date of Cardioversion  Any Additional Special Instructions Will Be Listed Below (If Applicable).

## 2015-06-07 ENCOUNTER — Telehealth: Payer: Self-pay | Admitting: Internal Medicine

## 2015-06-07 NOTE — Telephone Encounter (Signed)
New message     Pt c/o medication issue:  1. Name of Medication: amiodarone 2. How are you currently taking this medication (dosage and times per day)? 400 mg bid 3. Are you having a reaction (difficulty breathing--STAT)? no  4. What is your medication issue?  Pt states since starting taking this medication, she is very nauseated and vomiting.  She states she cannot take this medication.  Please advise

## 2015-06-07 NOTE — Telephone Encounter (Signed)
Reviewed reported symptoms with Dr. Caryl Comes- recommendations received to have the patient decrease the amiodarone dose to 200 mg BID and take with food. I called the patient and advised her of Dr. Olin Pia recommendations. She took her first dose of amiodarone last night and her second dose this morning.  She developed vomiting shortly after taking her medications.  It sounds as though she ate not long after taking her medications. I advised her to decrease amiodarone to 1/2 tablet (200 mg) twice daily and take at the same time she is eating. She will try this and call back on Friday to update [place] of how she is doing prior to going into the weekend.  I advised her if she is still unable to tolerate, we could contact Dr. Caryl Comes at the hospital on Friday to find out what his recommendations would be. The patient's INR is followed by her PCP office and she is scheduled to have this checked on Tuesday next week.

## 2015-06-08 ENCOUNTER — Encounter: Payer: Self-pay | Admitting: Internal Medicine

## 2015-06-08 ENCOUNTER — Encounter (HOSPITAL_COMMUNITY): Payer: Medicare Other

## 2015-06-08 NOTE — Telephone Encounter (Signed)
Follow up      Calling to give update to nurse

## 2015-06-08 NOTE — Telephone Encounter (Signed)
Follow up     Calling to give update: Pt only threw up once yesterday.  Her bp is 144/107 and HR is 94.  She states she is still in AFIB.  Please call

## 2015-06-08 NOTE — Telephone Encounter (Signed)
Patient reports she was awake most of the night with her heart in afib and fast. She didn't take the evening dose of amiodarone last night because she had PFTs scheduled this am and didn't want to be sick. (she feels to bad to go anyway so she rescheduled PFT for next Wed) This morning she has taken all AM meds except amiodarone. She is making breakfast now and will take 1/2 tab with breakfast.  Her BPs this am have been 162/103 and more recently 137/101.  She vomited all her AM meds yesterday.  Instructed patient to rest today and call back this afternoon with how she is feeling.

## 2015-06-08 NOTE — Telephone Encounter (Signed)
Patient called. She is feeling much better, just tired now. She tolerated the amiodarone with food at 200 mg BP and heart rate are improved  122/76, 77 She would like to clarify if there will be any dose reduction in the future, because that was the plan when she was supposed to taking the loading dose. Advised patient I am forwarding to Dr. Olin Pia nurse to clarify. She will keep taking the 1/2 (200 mg) tablet twice daily with food unless we call and instruct her otherwise. Pt is very appreciative for the assistance.

## 2015-06-08 NOTE — Telephone Encounter (Signed)
Called patient to check on how she is feeling. No answer, left voicemail to call back with update.

## 2015-06-12 NOTE — Telephone Encounter (Signed)
I called and spoke with the patient. She reports that she has been taking her amiodarone since last week. She has not been vomiting, but she is still out of rhythm with HR- 113 bpm. She is not sleeping well, and is nauseated. She moved her PFT's from last week to this week because she was not feeling well. She inquired if she could stop amiodarone. I advised I will discuss with Dr. Caryl Comes and call her back later today. She is agreeable.

## 2015-06-12 NOTE — Telephone Encounter (Signed)
Reviewed with Dr. Caryl Comes- ok to d/c amiodarone- no PFT's needed. Have the patient come in the office on 11/16 at 4:00 pm for follow up and to discuss plan of care. The patient is aware of the above, agreeable, and verbalizes understanding.

## 2015-06-12 NOTE — Telephone Encounter (Signed)
Will forward to Dr. Caryl Comes for further recommendations. The patient is pending DCCV in 2-3 weeks. I still need to schedule this.

## 2015-06-12 NOTE — Telephone Encounter (Signed)
F/u  Pt calling to follow up w/ RN. Please call back and discuss.

## 2015-06-13 DIAGNOSIS — I4891 Unspecified atrial fibrillation: Secondary | ICD-10-CM | POA: Diagnosis not present

## 2015-06-13 DIAGNOSIS — Z7901 Long term (current) use of anticoagulants: Secondary | ICD-10-CM | POA: Diagnosis not present

## 2015-06-14 ENCOUNTER — Ambulatory Visit (INDEPENDENT_AMBULATORY_CARE_PROVIDER_SITE_OTHER): Payer: Medicare Other | Admitting: Internal Medicine

## 2015-06-14 ENCOUNTER — Ambulatory Visit (HOSPITAL_COMMUNITY): Payer: Medicare Other | Attending: Internal Medicine

## 2015-06-14 ENCOUNTER — Encounter: Payer: Self-pay | Admitting: Internal Medicine

## 2015-06-14 VITALS — BP 166/82 | HR 68 | Ht 62.0 in | Wt 117.8 lb

## 2015-06-14 DIAGNOSIS — I4819 Other persistent atrial fibrillation: Secondary | ICD-10-CM

## 2015-06-14 DIAGNOSIS — I481 Persistent atrial fibrillation: Secondary | ICD-10-CM | POA: Diagnosis not present

## 2015-06-14 LAB — POCT INR: INR: 2.6

## 2015-06-14 NOTE — Progress Notes (Signed)
Patient Care Team: Thressa Sheller, MD as PCP - General (Internal Medicine)   HPI  [name] is a 81 y.o. female  seen in followup for paroxysmal atrial fibrillation for which she took Tikosyn.    She underwent unsuccessful cardioversion 7/16 and has been seen in the atrial fibrillation clinic a number of times over the last couple of months. She has had recurrence of atrial flutter as well as fibrillation. These have been quite problematic wise.  Possibility we should probably today so that we know that they are performing exposure to the drug been raised about AV junction ablation and pacing  He did not tolerate the amiodarone at higher dose; we decreased it from 400--200 twice daily. The nausea seemed to abate. She continues to feel crummy. It is her impression that this crummy this is more related to atrial fibrillation perhaps in the amiodarone     Past Medical History  Diagnosis Date  . Persistent atrial fibrillation ([place])        . First degree AV block   . SVT (supraventricular tachycardia) ([facility])     possible atrial tachycardia, possible junctional tacycardia  . Hypertension   . Hypothyroidism   . Hyperlipidemia   . Gastroesophageal reflux disease   . Seasonal allergies   . Hx of echocardiogram     echo 07/2009:  mild focal basal septal hypertrophy, EF 55-60%, mild MR, mod LAE, mild RAE, PASP 45  . Squamous carcinoma ([place]) 2016    "right leg; right jawline"  . History of hiatal hernia   . Arthritis     "back; hands" (01/13/2015)  . Anxiety     Past Surgical History  Procedure Laterality Date  . Laparoscopic bilateral salpingo oopherectomy Bilateral ~ 2004  . Knee arthroscopy Right 1990's  . Abdominal hysterectomy  ~ 1975  . Cardioversion  01/13/2015    "didn't work"  . Appendectomy  ~ 1975  . Cataract extraction w/ intraocular lens  implant, bilateral Bilateral 2011  . Dilation and curettage of uterus    . Cardiac catheterization  1990's  . Squamous cell  carcinoma excision  2016    "right leg; right jawline"  . Cardioversion N/A 01/13/2015    Procedure: CARDIOVERSION;  Surgeon: Josue Hector, MD;  Location: [facility] ENDOSCOPY;  Service: Cardiovascular;  Laterality: N/A;    Current Outpatient Prescriptions  Medication Sig Dispense Refill  . acetaminophen (TYLENOL) 500 MG tablet Take 500 mg by mouth every 8 (eight) hours as needed for mild pain or moderate pain.    . cholecalciferol (VITAMIN D) 1000 UNITS tablet Take 1,000 Units by mouth daily.    [name] diltiazem (CARDIZEM CD) 180 MG 24 hr capsule Take 1 capsule (180 mg total) by mouth daily. 30 capsule 11  . diltiazem (CARDIZEM) 30 MG tablet Take 1/2 - 1 tablet every 4 hours AS NEEDED for heart rate >100 as long as blood pressure >100 30 tablet 1  . fexofenadine (ALLEGRA) 180 MG tablet Take 180 mg by mouth daily as needed for allergies.     [name] labetalol (NORMODYNE) 100 MG tablet Take 1 tablet (100 mg total) by mouth 2 (two) times daily. 180 tablet 2  . levothyroxine (SYNTHROID, LEVOTHROID) 50 MCG tablet Take 50 mcg by mouth daily.      . Melatonin 3 MG TABS Take 1 tablet by mouth at bedtime.    . Multiple Vitamins-Minerals (VITEYES AREDS FORMULA/LUTEIN) CAPS Take 1 capsule by mouth daily.     [name] omeprazole (PRILOSEC OTC)  20 MG tablet Take 20 mg by mouth daily as needed. For heartburn    . warfarin (COUMADIN) 2.5 MG tablet Take 2.5 mg by mouth daily.     . [DISCONTINUED] pantoprazole (PROTONIX) 20 MG tablet Take 1 tablet (20 mg total) by mouth daily. 30 tablet 11  . [DISCONTINUED] pravastatin (PRAVACHOL) 80 MG tablet Take 40 mg by mouth Daily.     . [DISCONTINUED] spironolactone (ALDACTONE) 25 MG tablet Take 1/2 tablet by mouth once daily     No current facility-administered medications for this visit.    Allergies  Allergen Reactions  . Amiodarone Nausea And Vomiting  . Dabigatran     Unknown, per pt   . Sulfonamide Derivatives     REACTION: hives    Review of Systems negative except from HPI  and PMH  PE BP 166/82 mmHg  Pulse 68  Ht 5\' 2"  (1.575 m)  Wt 117 lb 12.8 oz (53.434 kg)  BMI 21.54 kg/m2 Well developed and well nourished in no acute distress HENT normal E scleral and icterus clear Neck Supple JVP flat; carotids brisk and full Clear to ausculation  Irregularly irregular    rhythm, no murmurs gallops or rub Soft with active bowel sounds No clubbing cyanosis n Edema Alert and oriented, grossly normal motor and sensory function Skin Warm and Dry     ECG  was ordered today and demonstrated atrial flutter   with variable conduction. Rate is 85 Intervals-/08/40   Assessment an  Plan  Atrial fibrillation-paroxysmal  Atrial tachycardia -recurrent   Hypertensive urgency  Fatigue  She seems to be tolerating the low-dose amiodarone. We will plan cardioversion next week. Thereafter we may be able to better distinguish whether the last 2 is related to the atrial fibrillation or the amiodarone.  We spent more than 50% of our >25 min visit in face to face counseling regarding the above

## 2015-06-14 NOTE — Patient Instructions (Signed)
Medication Instructions:  Your physician recommends that you continue on your current medications as directed. Please refer to the Current Medication list given to you today.   Labwork: none  Testing/Procedures: .Your physician has recommended that you have a Cardioversion (DCCV). Electrical Cardioversion uses a jolt of electricity to your heart either through paddles or wired patches attached to your chest. This is a controlled, usually prescheduled, procedure. Defibrillation is done under light anesthesia in the hospital, and you usually go home the day of the procedure. This is done to get your heart back into a normal rhythm. You are not awake for the procedure. Please see the instruction sheet given to you today. We will call you to schedule this.   Will need appointment in the coumadin clinic for INR check the day of cardioversion. We will call you about this once procedure is scheduled Follow-Up: Your physician recommends that you schedule a follow-up appointment in: Atrial fib clinic in 3 weeks.     Any Other Special Instructions Will Be Listed Below (If Applicable).     If you need a refill on your cardiac medications before your next appointment, please call your pharmacy.

## 2015-06-16 ENCOUNTER — Telehealth: Payer: Self-pay | Admitting: Internal Medicine

## 2015-06-16 ENCOUNTER — Encounter: Payer: Self-pay | Admitting: *Deleted

## 2015-06-16 NOTE — Telephone Encounter (Signed)
New Message  Pt requested to speak w/ RN concerning the specifics of her upcoming cardioversion. Please call back and discuss

## 2015-06-16 NOTE — Telephone Encounter (Signed)
Cardioversion scheduled for November 23,2016 at 11:00 with Dr. Aundra Dubin.  Pt will need to come to our office for INR check in the coumadin clinic on November 23,2016 at 9:00.  She is to be at hospital at 9:30 AM.  I verbally went over all instructions with pt. I placed call to  Dr. Noland Fordyce office to get recent INR readings but office is currently closed.

## 2015-06-20 NOTE — Telephone Encounter (Signed)
I spoke with a staff member at Dr. Doristine Devoid office and requested they fax over the patient's last 3 INR readings.

## 2015-06-21 ENCOUNTER — Ambulatory Visit (HOSPITAL_COMMUNITY): Payer: Medicare Other | Admitting: Anesthesiology

## 2015-06-21 ENCOUNTER — Encounter (HOSPITAL_COMMUNITY)
Admission: RE | Disposition: A | Discharge status: Home / Self Care | Payer: Self-pay | Source: Ambulatory Visit | Attending: Cardiology

## 2015-06-21 ENCOUNTER — Ambulatory Visit (INDEPENDENT_AMBULATORY_CARE_PROVIDER_SITE_OTHER): Payer: Medicare Other | Admitting: Pharmacist

## 2015-06-21 ENCOUNTER — Ambulatory Visit (HOSPITAL_COMMUNITY)
Admission: RE | Admit: 2015-06-21 | Discharge: 2015-06-21 | Disposition: A | Discharge status: Home / Self Care | Payer: Medicare Other | Source: Ambulatory Visit | Attending: Cardiology | Admitting: Cardiology

## 2015-06-21 ENCOUNTER — Encounter (HOSPITAL_COMMUNITY): Payer: Self-pay | Admitting: Anesthesiology

## 2015-06-21 DIAGNOSIS — Z79899 Other long term (current) drug therapy: Secondary | ICD-10-CM | POA: Diagnosis not present

## 2015-06-21 DIAGNOSIS — I480 Paroxysmal atrial fibrillation: Secondary | ICD-10-CM | POA: Diagnosis not present

## 2015-06-21 DIAGNOSIS — M19049 Primary osteoarthritis, unspecified hand: Secondary | ICD-10-CM | POA: Insufficient documentation

## 2015-06-21 DIAGNOSIS — F419 Anxiety disorder, unspecified: Secondary | ICD-10-CM | POA: Diagnosis not present

## 2015-06-21 DIAGNOSIS — I4892 Unspecified atrial flutter: Secondary | ICD-10-CM | POA: Diagnosis not present

## 2015-06-21 DIAGNOSIS — K219 Gastro-esophageal reflux disease without esophagitis: Secondary | ICD-10-CM | POA: Insufficient documentation

## 2015-06-21 DIAGNOSIS — I481 Persistent atrial fibrillation: Secondary | ICD-10-CM | POA: Diagnosis not present

## 2015-06-21 DIAGNOSIS — I10 Essential (primary) hypertension: Secondary | ICD-10-CM | POA: Insufficient documentation

## 2015-06-21 DIAGNOSIS — I4891 Unspecified atrial fibrillation: Secondary | ICD-10-CM

## 2015-06-21 DIAGNOSIS — I471 Supraventricular tachycardia: Secondary | ICD-10-CM | POA: Insufficient documentation

## 2015-06-21 DIAGNOSIS — E785 Hyperlipidemia, unspecified: Secondary | ICD-10-CM | POA: Diagnosis not present

## 2015-06-21 DIAGNOSIS — Z7901 Long term (current) use of anticoagulants: Secondary | ICD-10-CM | POA: Insufficient documentation

## 2015-06-21 DIAGNOSIS — E039 Hypothyroidism, unspecified: Secondary | ICD-10-CM | POA: Diagnosis not present

## 2015-06-21 DIAGNOSIS — I4819 Other persistent atrial fibrillation: Secondary | ICD-10-CM

## 2015-06-21 HISTORY — PX: CARDIOVERSION: SHX1299

## 2015-06-21 LAB — POCT INR: INR: 3

## 2015-06-21 LAB — POCT I-STAT 4, (NA,K, GLUC, HGB,HCT)
GLUCOSE: 102 mg/dL — AB (ref 65–99)
HEMATOCRIT: 38 % (ref 36.0–46.0)
Hemoglobin: 12.9 g/dL (ref 12.0–15.0)
POTASSIUM: 3.7 mmol/L (ref 3.5–5.1)
Sodium: 138 mmol/L (ref 135–145)

## 2015-06-21 SURGERY — CARDIOVERSION
Anesthesia: General

## 2015-06-21 MED ORDER — PROPOFOL 10 MG/ML IV BOLUS
INTRAVENOUS | Status: DC | PRN
  Administered 2015-06-21 (×2): 50 mg via INTRAVENOUS

## 2015-06-21 MED ORDER — LIDOCAINE HCL (CARDIAC) 20 MG/ML IV SOLN
INTRAVENOUS | Status: DC | PRN
  Administered 2015-06-21 (×2): 50 mg via INTRAVENOUS

## 2015-06-21 MED ORDER — SODIUM CHLORIDE 0.9 % IV SOLN
INTRAVENOUS | Status: DC
  Administered 2015-06-21: 10:00:00 via INTRAVENOUS

## 2015-06-21 NOTE — Anesthesia Preprocedure Evaluation (Addendum)
Anesthesia Evaluation  Patient identified by MRN, date of birth, ID band Patient awake    Reviewed: Allergy & Precautions, H&P , NPO status , Patient's Chart, lab work & pertinent test results, reviewed documented beta blocker date and time   Airway Mallampati: II  TM Distance: >3 FB Neck ROM: Full    Dental no notable dental hx. (+) Teeth Intact, Dental Advidsory Given   Pulmonary neg pulmonary ROS,    Pulmonary exam normal breath sounds clear to auscultation       Cardiovascular hypertension, Pt. on medications and Pt. on home beta blockers Normal cardiovascular exam+ dysrhythmias  Rhythm:Regular Rate:Normal     Neuro/Psych  Headaches, negative psych ROS   GI/Hepatic Neg liver ROS, hiatal hernia, GERD  ,  Endo/Other  Hypothyroidism   Renal/GU negative Renal ROS     Musculoskeletal negative musculoskeletal ROS (+)   Abdominal   Peds  Hematology negative hematology ROS (+)   Anesthesia Other Findings   Reproductive/Obstetrics negative OB ROS                            Anesthesia Physical  Anesthesia Plan  ASA: III  Anesthesia Plan: General   Post-op Pain Management:    Induction: Intravenous  Airway Management Planned: Mask  Additional Equipment:   Intra-op Plan:   Post-operative Plan:   Informed Consent: I have reviewed the patients History and Physical, chart, labs and discussed the procedure including the risks, benefits and alternatives for the proposed anesthesia with the patient or authorized representative who has indicated his/her understanding and acceptance.   Dental advisory given  Plan Discussed with: CRNA  Anesthesia Plan Comments:         Anesthesia Quick Evaluation

## 2015-06-21 NOTE — Discharge Instructions (Signed)
Electrical Cardioversion, Care After °Refer to this sheet in the next few weeks. These instructions provide you with information on caring for yourself after your procedure. Your health care provider may also give you more specific instructions. Your treatment has been planned according to current medical practices, but problems sometimes occur. Call your health care provider if you have any problems or questions after your procedure. °WHAT TO EXPECT AFTER THE PROCEDURE °After your procedure, it is typical to have the following sensations: °· Some redness on the skin where the shocks were delivered. If this is tender, a sunburn lotion or hydrocortisone cream may help. °· Possible return of an abnormal heart rhythm within hours or days after the procedure. °HOME CARE INSTRUCTIONS °· Take medicines only as directed by your health care provider. Be sure you understand how and when to take your medicine. °· Learn how to feel your pulse and check it often. °· Limit your activity for 48 hours after the procedure or as directed by your health care provider. °· Avoid or minimize caffeine and other stimulants as directed by your health care provider. °SEEK MEDICAL CARE IF: °· You feel like your heart is beating too fast or your pulse is not regular. °· You have any questions about your medicines. °· You have bleeding that will not stop. °SEEK IMMEDIATE MEDICAL CARE IF: °· You are dizzy or feel faint. °· It is hard to breathe or you feel short of breath. °· There is a change in discomfort in your chest. °· Your speech is slurred or you have trouble moving an arm or leg on one side of your body. °· You get a serious muscle cramp that does not go away. °· Your fingers or toes turn cold or blue. °  °This information is not intended to replace advice given to you by your health care provider. Make sure you discuss any questions you have with your health care provider. °  °Document Released: 05/05/2013 Document Revised: 08/05/2014  Document Reviewed: 05/05/2013 °Elsevier Interactive Patient Education ©2016 [employer] ° °

## 2015-06-21 NOTE — Patient Instructions (Signed)
Your INR Today was 3.0.  Continue your same dose of 2.5mg  daily.   Have your primary care doctor recheck your INR at the end of next week.

## 2015-06-21 NOTE — Transfer of Care (Signed)
Immediate Anesthesia Transfer of Care Note  Patient: [name]  Procedure(s) Performed: Procedure(s): CARDIOVERSION (N/A)  Patient Location: Endoscopy Unit  Anesthesia Type:MAC  Level of Consciousness: awake, alert , oriented and patient cooperative  Airway & Oxygen Therapy: Patient Spontanous Breathing and Patient connected to nasal cannula oxygen  Post-op Assessment: Report given to RN, Post -op Vital signs reviewed and stable and Patient moving all extremities  Post vital signs: Reviewed and stable  Last Vitals:  Filed Vitals:   06/21/15 0946 06/21/15 1120  BP: 187/98 167/80  Pulse: 90 97  Temp: 36.4 C   Resp: 15 18    Complications: No apparent anesthesia complications

## 2015-06-21 NOTE — H&P (View-Only) (Signed)
Patient Care Team: Thressa Sheller, MD as PCP - General (Internal Medicine)   HPI  [name] is a 81 y.o. female  seen in followup for paroxysmal atrial fibrillation for which she took Tikosyn.    She underwent unsuccessful cardioversion 7/16 and has been seen in the atrial fibrillation clinic a number of times over the last couple of months. She has had recurrence of atrial flutter as well as fibrillation. These have been quite problematic wise.  Possibility we should probably today so that we know that they are performing exposure to the drug been raised about AV junction ablation and pacing  He did not tolerate the amiodarone at higher dose; we decreased it from 400--200 twice daily. The nausea seemed to abate. She continues to feel crummy. It is her impression that this crummy this is more related to atrial fibrillation perhaps in the amiodarone     Past Medical History  Diagnosis Date  . Persistent atrial fibrillation ([place])        . First degree AV block   . SVT (supraventricular tachycardia) ([facility])     possible atrial tachycardia, possible junctional tacycardia  . Hypertension   . Hypothyroidism   . Hyperlipidemia   . Gastroesophageal reflux disease   . Seasonal allergies   . Hx of echocardiogram     echo 07/2009:  mild focal basal septal hypertrophy, EF 55-60%, mild MR, mod LAE, mild RAE, PASP 45  . Squamous carcinoma ([place]) 2016    "right leg; right jawline"  . History of hiatal hernia   . Arthritis     "back; hands" (01/13/2015)  . Anxiety     Past Surgical History  Procedure Laterality Date  . Laparoscopic bilateral salpingo oopherectomy Bilateral ~ 2004  . Knee arthroscopy Right 1990's  . Abdominal hysterectomy  ~ 1975  . Cardioversion  01/13/2015    "didn't work"  . Appendectomy  ~ 1975  . Cataract extraction w/ intraocular lens  implant, bilateral Bilateral 2011  . Dilation and curettage of uterus    . Cardiac catheterization  1990's  . Squamous cell  carcinoma excision  2016    "right leg; right jawline"  . Cardioversion N/A 01/13/2015    Procedure: CARDIOVERSION;  Surgeon: Josue Hector, MD;  Location: [facility] ENDOSCOPY;  Service: Cardiovascular;  Laterality: N/A;    Current Outpatient Prescriptions  Medication Sig Dispense Refill  . acetaminophen (TYLENOL) 500 MG tablet Take 500 mg by mouth every 8 (eight) hours as needed for mild pain or moderate pain.    . cholecalciferol (VITAMIN D) 1000 UNITS tablet Take 1,000 Units by mouth daily.    [name] diltiazem (CARDIZEM CD) 180 MG 24 hr capsule Take 1 capsule (180 mg total) by mouth daily. 30 capsule 11  . diltiazem (CARDIZEM) 30 MG tablet Take 1/2 - 1 tablet every 4 hours AS NEEDED for heart rate >100 as long as blood pressure >100 30 tablet 1  . fexofenadine (ALLEGRA) 180 MG tablet Take 180 mg by mouth daily as needed for allergies.     [name] labetalol (NORMODYNE) 100 MG tablet Take 1 tablet (100 mg total) by mouth 2 (two) times daily. 180 tablet 2  . levothyroxine (SYNTHROID, LEVOTHROID) 50 MCG tablet Take 50 mcg by mouth daily.      . Melatonin 3 MG TABS Take 1 tablet by mouth at bedtime.    . Multiple Vitamins-Minerals (VITEYES AREDS FORMULA/LUTEIN) CAPS Take 1 capsule by mouth daily.     [name] omeprazole (PRILOSEC OTC)  20 MG tablet Take 20 mg by mouth daily as needed. For heartburn    . warfarin (COUMADIN) 2.5 MG tablet Take 2.5 mg by mouth daily.     . [DISCONTINUED] pantoprazole (PROTONIX) 20 MG tablet Take 1 tablet (20 mg total) by mouth daily. 30 tablet 11  . [DISCONTINUED] pravastatin (PRAVACHOL) 80 MG tablet Take 40 mg by mouth Daily.     . [DISCONTINUED] spironolactone (ALDACTONE) 25 MG tablet Take 1/2 tablet by mouth once daily     No current facility-administered medications for this visit.    Allergies  Allergen Reactions  . Amiodarone Nausea And Vomiting  . Dabigatran     Unknown, per pt   . Sulfonamide Derivatives     REACTION: hives    Review of Systems negative except from HPI  and PMH  PE BP 166/82 mmHg  Pulse 68  Ht 5\' 2"  (1.575 m)  Wt 117 lb 12.8 oz (53.434 kg)  BMI 21.54 kg/m2 Well developed and well nourished in no acute distress HENT normal E scleral and icterus clear Neck Supple JVP flat; carotids brisk and full Clear to ausculation  Irregularly irregular    rhythm, no murmurs gallops or rub Soft with active bowel sounds No clubbing cyanosis n Edema Alert and oriented, grossly normal motor and sensory function Skin Warm and Dry     ECG  was ordered today and demonstrated atrial flutter   with variable conduction. Rate is 85 Intervals-/08/40   Assessment an  Plan  Atrial fibrillation-paroxysmal  Atrial tachycardia -recurrent   Hypertensive urgency  Fatigue  She seems to be tolerating the low-dose amiodarone. We will plan cardioversion next week. Thereafter we may be able to better distinguish whether the last 2 is related to the atrial fibrillation or the amiodarone.  We spent more than 50% of our >25 min visit in face to face counseling regarding the above

## 2015-06-21 NOTE — Procedures (Addendum)
Electrical Cardioversion Procedure Note [name] [id number] November 25, 1924  Procedure: Electrical Cardioversion Indications:  Atrial Fibrillation  Procedure Details Consent: Risks of procedure as well as the alternatives and risks of each were explained to the (patient/caregiver).  Consent for procedure obtained. Time Out: Verified patient identification, verified procedure, site/side was marked, verified correct patient position, special equipment/implants available, medications/allergies/relevent history reviewed, required imaging and test results available.  Performed  Patient placed on cardiac monitor, pulse oximetry, supplemental oxygen as necessary.  Sedation given: Propofol per anesthesiology Pacer pads placed anterior and posterior chest.  Cardioverted 2 time(s).  Cardioverted at 200J => atrial tachycardia.  Cardioverted at 150J => NSR with frequent PVCs.   Evaluation Findings: Post procedure EKG shows: NSR.  There were frequent PACs but is currently in NSR. Complications: None Patient did tolerate procedure well.  She will continue amiodarone.  Given frequency of PACs, I am concerned that sustained atrial arrhythmia has a fairly robust chance of recurring.    [name] 06/21/2015, 11:19 AM

## 2015-06-21 NOTE — Anesthesia Postprocedure Evaluation (Signed)
Anesthesia Post Note  Patient: [name]  Procedure(s) Performed: Procedure(s) (LRB): CARDIOVERSION (N/A)  Patient location during evaluation: Endoscopy Anesthesia Type: MAC Level of consciousness: awake, oriented and patient cooperative Pain management: pain level controlled Vital Signs Assessment: post-procedure vital signs reviewed and stable Respiratory status: spontaneous breathing Cardiovascular status: stable Anesthetic complications: no    Last Vitals:  Filed Vitals:   06/21/15 0946 06/21/15 1120  BP: 187/98 167/80  Pulse: 90 97  Temp: 36.4 C   Resp: 15 18    Last Pain: There were no vitals filed for this visit.               Averi Cacioppo

## 2015-06-21 NOTE — Interval H&P Note (Signed)
History and Physical Interval Note:  06/21/2015 11:11 AM  [name]  has presented today for surgery, with the diagnosis of AFIB  The various methods of treatment have been discussed with the patient and family. After consideration of risks, benefits and other options for treatment, the patient has consented to  Procedure(s): CARDIOVERSION (N/A) as a surgical intervention .  The patient's history has been reviewed, patient examined, no change in status, stable for surgery.  I have reviewed the patient's chart and labs.  Questions were answered to the patient's satisfaction.     [place] [employer]

## 2015-06-23 ENCOUNTER — Encounter (HOSPITAL_COMMUNITY): Payer: Self-pay | Admitting: Cardiology

## 2015-06-26 ENCOUNTER — Telehealth: Payer: Self-pay | Admitting: Internal Medicine

## 2015-06-26 NOTE — Telephone Encounter (Signed)
appt made for 11/29 @ 11:30am

## 2015-06-26 NOTE — Telephone Encounter (Signed)
I spoke with the patient. She had a DCCV on 06/21/15 with Dr. Aundra Dubin. Per Dr. Claris Gladden cardioversion note, the patient did convert to NSR w/ PAC's. The patient states she was in rhythm about 2-3 days, then converted back to a-fib. She states that her HR's have intermittently been around 114 bpm since then. She took an extra diltiazem over the weekend and she thinks it dropped her BP too low as she did not feel well at all. She is scheduled to follow up with Roderic Palau, NP in the a-fib clinic on 07/06/15. I advised the patient I will forward to Rushie Goltz, RN for Roderic Palau and see if they can see her this week. The patient states she would really appreciate being seen sooner. I advised her Marzetta Board may call her directly to offer times that work best for the patient as I cannot schedule on their schedule. The patient verbalizes understanding.

## 2015-06-26 NOTE — Telephone Encounter (Signed)
New message      Pt had a cardioversion last wed. She still has an irr heart beat.  Please advise

## 2015-06-27 ENCOUNTER — Ambulatory Visit (HOSPITAL_COMMUNITY)
Admission: RE | Admit: 2015-06-27 | Discharge: 2015-06-27 | Disposition: A | Discharge status: Home / Self Care | Payer: Medicare Other | Source: Ambulatory Visit | Attending: Nurse Practitioner | Admitting: Nurse Practitioner

## 2015-06-27 VITALS — BP 120/82 | HR 78 | Ht 62.0 in | Wt 116.8 lb

## 2015-06-27 DIAGNOSIS — I481 Persistent atrial fibrillation: Secondary | ICD-10-CM | POA: Diagnosis not present

## 2015-06-27 DIAGNOSIS — I4819 Other persistent atrial fibrillation: Secondary | ICD-10-CM

## 2015-06-27 NOTE — Patient Instructions (Signed)
Your physician has recommended you make the following change in your medication:  1)Decrease amiodarone to 200mg once a day   

## 2015-06-27 NOTE — Progress Notes (Signed)
Patient ID: [name], female   DOB: Dec 03, 1924, 81 y.o.   MRN: [id number]     Primary Care Physician: Thressa Sheller, MD Referring Physician: Dr. Purvis Sheffield Mertz is a 81 y.o. female with a h/o persistent afib that has now failed tikosyn, most recently had a short amiodarone  load,due to intolerance of higher  doses of amiodarone and failed cardioversion last week. She asked to be seen today for her next option. However, she doesn't have too many options left. She feels more tired in afib and cannot sleep at night. She is c/o constipation and wants to stop amiodarone. Today, Ekg shows rate controlled afib/flutter at 78 bpm, qrs int 82 ms, qtc 440 ms.   Today, she denies symptoms of palpitations, chest pain, shortness of breath, orthopnea, PND, lower extremity edema, dizziness, presyncope, syncope, or neurologic sequela. The patient is tolerating medications without difficulties and is otherwise without complaint today.   Past Medical History  Diagnosis Date  . Persistent atrial fibrillation ([place])        . First degree AV block   . SVT (supraventricular tachycardia) (HCC)     possible atrial tachycardia, possible junctional tacycardia  . Hypertension   . Hypothyroidism   . Hyperlipidemia   . Gastroesophageal reflux disease   . Seasonal allergies   . Hx of echocardiogram     echo 07/2009:  mild focal basal septal hypertrophy, EF 55-60%, mild MR, mod LAE, mild RAE, PASP 45  . Squamous carcinoma ([place]) 2016    "right leg; right jawline"  . History of hiatal hernia   . Arthritis     "back; hands" (01/13/2015)  . Anxiety    Past Surgical History  Procedure Laterality Date  . Laparoscopic bilateral salpingo oopherectomy Bilateral ~ 2004  . Knee arthroscopy Right 1990's  . Abdominal hysterectomy  ~ 1975  . Cardioversion  01/13/2015    "didn't work"  . Appendectomy  ~ 1975  . Cataract extraction w/ intraocular lens  implant, bilateral Bilateral 2011  . Dilation and curettage  of uterus    . Cardiac catheterization  1990's  . Squamous cell carcinoma excision  2016    "right leg; right jawline"  . Cardioversion N/A 01/13/2015    Procedure: CARDIOVERSION;  Surgeon: Josue Hector, MD;  Location: [facility] ENDOSCOPY;  Service: Cardiovascular;  Laterality: N/A;  . Cardioversion N/A 06/21/2015    Procedure: CARDIOVERSION;  Surgeon: Larey Dresser, MD;  Location: [facility];  Service: Cardiovascular;  Laterality: N/A;    Current Outpatient Prescriptions  Medication Sig Dispense Refill  . acetaminophen (TYLENOL) 500 MG tablet Take 500 mg by mouth every 8 (eight) hours as needed for mild pain or moderate pain.    [name] amiodarone (PACERONE) 200 MG tablet Take 200 mg by mouth daily.    . cholecalciferol (VITAMIN D) 1000 UNITS tablet Take 1,000 Units by mouth daily.    [name] diltiazem (CARDIZEM CD) 180 MG 24 hr capsule Take 1 capsule (180 mg total) by mouth daily. 30 capsule 11  . diltiazem (CARDIZEM) 30 MG tablet Take 1/2 - 1 tablet every 4 hours AS NEEDED for heart rate >100 as long as blood pressure >100 30 tablet 1  . fexofenadine (ALLEGRA) 180 MG tablet Take 180 mg by mouth daily as needed for allergies.     [name] labetalol (NORMODYNE) 100 MG tablet Take 1 tablet (100 mg total) by mouth 2 (two) times daily. 180 tablet 2  . levothyroxine (SYNTHROID, LEVOTHROID) 50 MCG tablet  Take 50 mcg by mouth daily.      . Melatonin 3 MG TABS Take 1 tablet by mouth at bedtime.    . Multiple Vitamins-Minerals (VITEYES AREDS FORMULA/LUTEIN) CAPS Take 1 capsule by mouth daily.     [name] omeprazole (PRILOSEC OTC) 20 MG tablet Take 20 mg by mouth daily as needed. For heartburn    . warfarin (COUMADIN) 2.5 MG tablet Take 2.5 mg by mouth daily.     . [DISCONTINUED] pantoprazole (PROTONIX) 20 MG tablet Take 1 tablet (20 mg total) by mouth daily. 30 tablet 11  . [DISCONTINUED] pravastatin (PRAVACHOL) 80 MG tablet Take 40 mg by mouth Daily.     . [DISCONTINUED] spironolactone (ALDACTONE) 25 MG tablet Take 1/2  tablet by mouth once daily     No current facility-administered medications for this encounter.    Allergies  Allergen Reactions  . Amiodarone Nausea And Vomiting    Only at higher dose.  Able to tolerate lower dose  . Dabigatran     Unknown, per pt   . Sulfonamide Derivatives     REACTION: hives    Social History   Social History  . Marital Status: Widowed    Spouse Name: N/A  . Number of Children: N/A  . Years of Education: N/A   Occupational History  . Not on file.   Social History Main Topics  . Smoking status: Never Smoker   . Smokeless tobacco: Never Used  . Alcohol Use: No  . Drug Use: No  . Sexual Activity: No   Other Topics Concern  . Not on file   Social History Narrative   She has two children, two grandchildren and three great grandchildren    Family History  Problem Relation Age of Onset  . Heart disease Father     ROS- All systems are reviewed and negative except as per the HPI above  Physical Exam: Filed Vitals:   06/27/15 1145  BP: 120/82  Pulse: 78  Height: 5\' 2"  (1.575 m)  Weight: 116 lb 12.8 oz (52.98 kg)    GEN- The patient is well appearing, alert and oriented x 3 today.   Head- normocephalic, atraumatic Eyes-  Sclera clear, conjunctiva pink Ears- hearing intact Oropharynx- clear Neck- supple, no JVP Lymph- no cervical lymphadenopathy Lungs- Clear to ausculation bilaterally, normal work of breathing Heart- Irregular rate and rhythm, no murmurs, rubs or gallops, PMI not laterally displaced GI- soft, NT, ND, + BS Extremities- no clubbing, cyanosis, or edema MS- no significant deformity or atrophy Skin- no rash or lesion Psych- euthymic mood, full affect Neuro- strength and sensation are intact  EKG- afib at 78 bpm, qrs 82 ms, qtc 440 ms Epic records reviewed  Assessment and Plan: 1.Persistent afib Failed tikosyn Failed short amio load and cardioversion For now reduce amiodarone  to 200 mg, ? Longer load at lower dose  and retry cardioversion, although pt states that she does not care for another cardioversion. I will speak to Dr. Caryl Comes when he returns from vacation to discuss next step, ? Av node ablation/ppm  F/u with afib clinic next week and I will have an opportunity to speak to Dr. Caryl Comes by then   Geroge Baseman. [facility] [address] [place], [place] [address] [phone]  In one note he discussed av node ablation/PPM as last option.

## 2015-07-02 ENCOUNTER — Other Ambulatory Visit: Payer: Self-pay | Admitting: Internal Medicine

## 2015-07-03 ENCOUNTER — Telehealth (HOSPITAL_COMMUNITY): Payer: Self-pay | Admitting: *Deleted

## 2015-07-03 NOTE — Telephone Encounter (Signed)
Pt called in stating she tried decreasing her amiodarone dose as instructed by Butch Penny but by the second day of decreased dose she felt terrible so she increased it back to twice daily and she feels much better and thinks she maybe in normal rhythm now.  Wants to discuss with Roderic Palau NP -- will have Butch Penny call patient 12/6 upon her return.

## 2015-07-04 NOTE — Telephone Encounter (Signed)
Butch Penny talked with patient had reverted back to afib today. Butch Penny will discuss case with Dr. Caryl Comes and give further instructions at her appt on Thursday. To stay on Amiodarone 200mg  twice daily until appt. Patient was agreeable.

## 2015-07-06 ENCOUNTER — Ambulatory Visit (HOSPITAL_COMMUNITY)
Admission: RE | Admit: 2015-07-06 | Discharge: 2015-07-06 | Disposition: A | Discharge status: Home / Self Care | Payer: Medicare Other | Source: Ambulatory Visit | Attending: Nurse Practitioner | Admitting: Nurse Practitioner

## 2015-07-06 ENCOUNTER — Encounter (HOSPITAL_COMMUNITY): Payer: Self-pay | Admitting: Nurse Practitioner

## 2015-07-06 VITALS — BP 162/80 | HR 83 | Ht 65.0 in | Wt 115.6 lb

## 2015-07-06 DIAGNOSIS — I4819 Other persistent atrial fibrillation: Secondary | ICD-10-CM

## 2015-07-06 DIAGNOSIS — I481 Persistent atrial fibrillation: Secondary | ICD-10-CM

## 2015-07-06 LAB — CBC
HEMATOCRIT: 39.6 % (ref 36.0–46.0)
HEMOGLOBIN: 12.5 g/dL (ref 12.0–15.0)
MCH: 25.6 pg — ABNORMAL LOW (ref 26.0–34.0)
MCHC: 31.6 g/dL (ref 30.0–36.0)
MCV: 81 fL (ref 78.0–100.0)
Platelets: 229 10*3/uL (ref 150–400)
RBC: 4.89 MIL/uL (ref 3.87–5.11)
RDW: 15.5 % (ref 11.5–15.5)
WBC: 6 10*3/uL (ref 4.0–10.5)

## 2015-07-06 LAB — BASIC METABOLIC PANEL
ANION GAP: 8 (ref 5–15)
BUN: 15 mg/dL (ref 6–20)
CALCIUM: 9.3 mg/dL (ref 8.9–10.3)
CHLORIDE: 102 mmol/L (ref 101–111)
CO2: 27 mmol/L (ref 22–32)
Creatinine, Ser: 1.16 mg/dL — ABNORMAL HIGH (ref 0.44–1.00)
GFR calc non Af Amer: 41 mL/min — ABNORMAL LOW (ref >60)
GFR, EST AFRICAN AMERICAN: 47 mL/min — AB (ref >60)
GLUCOSE: 134 mg/dL — AB (ref 65–99)
POTASSIUM: 4.4 mmol/L (ref 3.5–5.1)
Sodium: 137 mmol/L (ref 135–145)

## 2015-07-06 LAB — PROTIME-INR
INR: 3.24 — AB (ref 0.00–1.49)
Prothrombin Time: 32.5 seconds — ABNORMAL HIGH (ref 11.6–15.2)

## 2015-07-06 NOTE — Progress Notes (Signed)
Patient ID: [name], female   DOB: November 03, 1924, 81 y.o.   MRN: [id number] Patient ID: Kaitlyn Blackbird Mccammon, female   DOB: 1925/02/24, 81 y.o.   MRN: [id number]     Primary Care Physician: Thressa Sheller, MD Referring Physician: Dr. Purvis Sheffield Murch is a 81 y.o. female with a h/o persistent afib that has now failed tikosyn, most recently had a short amiodarone  load,due to intolerance of higher  doses of amiodarone and failed cardioversion last week. She initially cardioverted but was in a tach, sedated and cardioverted again, with NSR with pac's. She had early return of afib  wiithin 2 days.She asked to be seen following failed cardioversion for next options. However, she doesn't have too many options left. She feels more tired in afib and cannot sleep at night. She is c/o constipation and wanted to stop amiodarone. We discussed reducing amiodarone to 200 mg a day, allowing for a longer slower load and trying cardioversion again. Also discussed AV ablation with PPM. I discussed with Dr. Caryl Comes and he was in favor of that plan.  She returns to the office today stating that she reduced amiodarone to 200 mg a day and felt worse, she increased amiodarone back  to 200 mg bid and felt better, thought she may have returned to SR for 2 days. Today in office she is in rate controlled afib. Still having issues sleeping. She is in favor of one more cardioversion after another week of amiodarone loading at 200 mg bid and if she has ERAF, will return to Dr. Caryl Comes to discuss AV ablation with PPM.   Today, she denies symptoms of palpitations, chest pain, shortness of breath, orthopnea, PND, lower extremity edema, dizziness, presyncope, syncope, or neurologic sequela. The patient is tolerating medications without difficulties and is otherwise without complaint today.   Past Medical History  Diagnosis Date  . Persistent atrial fibrillation ([place])        . First degree AV block   . SVT (supraventricular  tachycardia) (HCC)     possible atrial tachycardia, possible junctional tacycardia  . Hypertension   . Hypothyroidism   . Hyperlipidemia   . Gastroesophageal reflux disease   . Seasonal allergies   . Hx of echocardiogram     echo 07/2009:  mild focal basal septal hypertrophy, EF 55-60%, mild MR, mod LAE, mild RAE, PASP 45  . Squamous carcinoma ([place]) 2016    "right leg; right jawline"  . History of hiatal hernia   . Arthritis     "back; hands" (01/13/2015)  . Anxiety    Past Surgical History  Procedure Laterality Date  . Laparoscopic bilateral salpingo oopherectomy Bilateral ~ 2004  . Knee arthroscopy Right 1990's  . Abdominal hysterectomy  ~ 1975  . Cardioversion  01/13/2015    "didn't work"  . Appendectomy  ~ 1975  . Cataract extraction w/ intraocular lens  implant, bilateral Bilateral 2011  . Dilation and curettage of uterus    . Cardiac catheterization  1990's  . Squamous cell carcinoma excision  2016    "right leg; right jawline"  . Cardioversion N/A 01/13/2015    Procedure: CARDIOVERSION;  Surgeon: Josue Hector, MD;  Location: [facility] ENDOSCOPY;  Service: Cardiovascular;  Laterality: N/A;  . Cardioversion N/A 06/21/2015    Procedure: CARDIOVERSION;  Surgeon: Larey Dresser, MD;  Location: [place];  Service: Cardiovascular;  Laterality: N/A;    Current Outpatient Prescriptions  Medication Sig Dispense Refill  . acetaminophen (TYLENOL) 500 MG  tablet Take 500 mg by mouth every 8 (eight) hours as needed for mild pain or moderate pain.    [name] amiodarone (PACERONE) 200 MG tablet Take 400 mg by mouth daily.     . cholecalciferol (VITAMIN D) 1000 UNITS tablet Take 1,000 Units by mouth daily.    [name] diltiazem (CARDIZEM CD) 180 MG 24 hr capsule Take 1 capsule (180 mg total) by mouth daily. 30 capsule 11  . diltiazem (CARDIZEM) 30 MG tablet Take 1/2 - 1 tablet every 4 hours AS NEEDED for heart rate >100 as long as blood pressure >100 30 tablet 1  . fexofenadine (ALLEGRA) 180 MG tablet  Take 180 mg by mouth daily as needed for allergies.     [name] labetalol (NORMODYNE) 100 MG tablet Take 1 tablet (100 mg total) by mouth 2 (two) times daily. 180 tablet 2  . levothyroxine (SYNTHROID, LEVOTHROID) 50 MCG tablet Take 50 mcg by mouth daily.      . Melatonin 3 MG TABS Take 1 tablet by mouth at bedtime.    . Multiple Vitamins-Minerals (VITEYES AREDS FORMULA/LUTEIN) CAPS Take 1 capsule by mouth daily.     [name] omeprazole (PRILOSEC OTC) 20 MG tablet Take 20 mg by mouth daily as needed. For heartburn    . warfarin (COUMADIN) 2.5 MG tablet Take 2.5 mg by mouth daily.     . [DISCONTINUED] pantoprazole (PROTONIX) 20 MG tablet Take 1 tablet (20 mg total) by mouth daily. 30 tablet 11  . [DISCONTINUED] pravastatin (PRAVACHOL) 80 MG tablet Take 40 mg by mouth Daily.     . [DISCONTINUED] spironolactone (ALDACTONE) 25 MG tablet Take 1/2 tablet by mouth once daily     No current facility-administered medications for this encounter.    Allergies  Allergen Reactions  . Amiodarone Nausea And Vomiting    Only at higher dose.  Able to tolerate lower dose  . Dabigatran     Unknown, per pt   . Sulfonamide Derivatives     REACTION: hives    Social History   Social History  . Marital Status: Widowed    Spouse Name: N/A  . Number of Children: N/A  . Years of Education: N/A   Occupational History  . Not on file.   Social History Main Topics  . Smoking status: Never Smoker   . Smokeless tobacco: Never Used  . Alcohol Use: No  . Drug Use: No  . Sexual Activity: No   Other Topics Concern  . Not on file   Social History Narrative   She has two children, two grandchildren and three great grandchildren    Family History  Problem Relation Age of Onset  . Heart disease Father     ROS- All systems are reviewed and negative except as per the HPI above  Physical Exam: Filed Vitals:   07/06/15 1107  BP: 162/80  Pulse: 83  Height: 5\' 5"  (1.651 m)  Weight: 115 lb 9.6 oz (52.436 kg)     GEN- The patient is well appearing, alert and oriented x 3 today.   Head- normocephalic, atraumatic Eyes-  Sclera clear, conjunctiva pink Ears- hearing intact Oropharynx- clear Neck- supple, no JVP Lymph- no cervical lymphadenopathy Lungs- Clear to ausculation bilaterally, normal work of breathing Heart- Irregular rate and rhythm, no murmurs, rubs or gallops, PMI not laterally displaced GI- soft, NT, ND, + BS Extremities- no clubbing, cyanosis, or edema MS- no significant deformity or atrophy Skin- no rash or lesion Psych- euthymic mood, full affect Neuro- strength  and sensation are intact  EKG- afib at 78 bpm, qrs 82 ms, qtc 440 ms Epic records reviewed  Assessment and Plan: 1.Persistent afib Failed tikosyn Failed short amio load and cardioversion 11/23 Allow longer load at amiodarone 200 mg bid and attempt cardioversion on 12/19, will try to schedule with Dr. Caryl Comes if possible. If ERAF, she will return to Dr. Caryl Comes to discuss AV nodal ablation Bmet/CBC/INR today Was therapeutic prior to last cardioversion, INR 11/16- 2.6, 12-8, 3.0 and today, 3.24 and will be checked am of procedure.  F/u one week after cardioversion in afib clinic  Janiyla Long C. [facility] [address] [place], [place] [address] [phone]  In one note he discussed av node ablation/PPM as last option.

## 2015-07-07 ENCOUNTER — Telehealth: Payer: Self-pay | Admitting: *Deleted

## 2015-07-07 NOTE — Telephone Encounter (Signed)
Scheduled DCCV per Dr. Ellis Savage Rn. Scheduled for 12/19 w/ Dr. Caryl Comes.  Reviewed instructions with patient and advised to arrive at [facility] hospital at 9:30 a.m. NPO after MN the night 12/18. Patient verbalized understanding and agreeable to plan.

## 2015-07-10 ENCOUNTER — Telehealth (HOSPITAL_COMMUNITY): Payer: Self-pay | Admitting: *Deleted

## 2015-07-10 NOTE — Telephone Encounter (Signed)
Pt called in stating she was not feeling well today wanting to decrease her amiodarone back down. Roderic Palau NP decided to go ahead and decrease amiodarone 200mg  once daily. Patient will call back if still not feeling better after decreasing.

## 2015-07-11 ENCOUNTER — Telehealth (HOSPITAL_COMMUNITY): Payer: Self-pay | Admitting: *Deleted

## 2015-07-11 ENCOUNTER — Emergency Department (HOSPITAL_COMMUNITY): Payer: Medicare Other

## 2015-07-11 ENCOUNTER — Emergency Department (HOSPITAL_COMMUNITY)
Admission: EM | Admit: 2015-07-11 | Discharge: 2015-07-11 | Disposition: A | Discharge status: Home / Self Care | Payer: Medicare Other | Attending: Emergency Medicine | Admitting: Emergency Medicine

## 2015-07-11 ENCOUNTER — Encounter (HOSPITAL_COMMUNITY): Payer: Self-pay | Admitting: Neurology

## 2015-07-11 DIAGNOSIS — I10 Essential (primary) hypertension: Secondary | ICD-10-CM | POA: Diagnosis not present

## 2015-07-11 DIAGNOSIS — E785 Hyperlipidemia, unspecified: Secondary | ICD-10-CM | POA: Insufficient documentation

## 2015-07-11 DIAGNOSIS — M1990 Unspecified osteoarthritis, unspecified site: Secondary | ICD-10-CM | POA: Insufficient documentation

## 2015-07-11 DIAGNOSIS — Z79899 Other long term (current) drug therapy: Secondary | ICD-10-CM | POA: Insufficient documentation

## 2015-07-11 DIAGNOSIS — R079 Chest pain, unspecified: Secondary | ICD-10-CM | POA: Diagnosis not present

## 2015-07-11 DIAGNOSIS — F419 Anxiety disorder, unspecified: Secondary | ICD-10-CM | POA: Diagnosis not present

## 2015-07-11 DIAGNOSIS — K219 Gastro-esophageal reflux disease without esophagitis: Secondary | ICD-10-CM | POA: Diagnosis not present

## 2015-07-11 DIAGNOSIS — E039 Hypothyroidism, unspecified: Secondary | ICD-10-CM | POA: Diagnosis not present

## 2015-07-11 DIAGNOSIS — R0789 Other chest pain: Secondary | ICD-10-CM | POA: Diagnosis not present

## 2015-07-11 LAB — CBC
HCT: 39.3 % (ref 36.0–46.0)
HEMOGLOBIN: 12.4 g/dL (ref 12.0–15.0)
MCH: 25.6 pg — ABNORMAL LOW (ref 26.0–34.0)
MCHC: 31.6 g/dL (ref 30.0–36.0)
MCV: 81.2 fL (ref 78.0–100.0)
Platelets: 232 10*3/uL (ref 150–400)
RBC: 4.84 MIL/uL (ref 3.87–5.11)
RDW: 15.5 % (ref 11.5–15.5)
WBC: 6.8 10*3/uL (ref 4.0–10.5)

## 2015-07-11 LAB — PROTIME-INR
INR: 4.4 — AB (ref 0.00–1.49)
Prothrombin Time: 40.8 seconds — ABNORMAL HIGH (ref 11.6–15.2)

## 2015-07-11 LAB — BASIC METABOLIC PANEL
ANION GAP: 9 (ref 5–15)
BUN: 14 mg/dL (ref 6–20)
CALCIUM: 9.2 mg/dL (ref 8.9–10.3)
CO2: 26 mmol/L (ref 22–32)
Chloride: 103 mmol/L (ref 101–111)
Creatinine, Ser: 0.99 mg/dL (ref 0.44–1.00)
GFR, EST AFRICAN AMERICAN: 57 mL/min — AB (ref >60)
GFR, EST NON AFRICAN AMERICAN: 49 mL/min — AB (ref >60)
Glucose, Bld: 115 mg/dL — ABNORMAL HIGH (ref 65–99)
Potassium: 4 mmol/L (ref 3.5–5.1)
Sodium: 138 mmol/L (ref 135–145)

## 2015-07-11 LAB — I-STAT TROPONIN, ED
TROPONIN I, POC: 0.01 ng/mL (ref 0.00–0.08)
TROPONIN I, POC: 0.01 ng/mL (ref 0.00–0.08)

## 2015-07-11 MED ORDER — ALUM & MAG HYDROXIDE-SIMETH 200-200-20 MG/5ML PO SUSP
15.0000 mL | Freq: Once | ORAL | Status: AC
  Administered 2015-07-11: 15 mL via ORAL
  Filled 2015-07-11: qty 30

## 2015-07-11 NOTE — Telephone Encounter (Signed)
Pt called in stating she is having CP/right arm pain/numbness which started around 2AM. Described as sharp constant pain in right/mid chest area with right arm pain and numbness. BP 160/101 HR 78. Recommended pt report to ER to be further evaluated. Patient states she would talk with her daughter to arrange transportation versus EMS arrival.

## 2015-07-11 NOTE — Discharge Instructions (Signed)
Your INR was 4.4 today.  Do not take your coumadin tonight and contact the coumadin clinic in the morning for dosage adjustments.  Follow up with Dr. Caryl Comes.  Get rechecked immediately if you develop any new or worrisome symptoms.     Nonspecific Chest Pain  Chest pain can be caused by many different conditions. There is always a chance that your pain could be related to something serious, such as a heart attack or a blood clot in your lungs. Chest pain can also be caused by conditions that are not life-threatening. If you have chest pain, it is very important to follow up with your health care provider. CAUSES  Chest pain can be caused by:  Heartburn.  Pneumonia or bronchitis.  Anxiety or stress.  Inflammation around your heart (pericarditis) or lung (pleuritis or pleurisy).  A blood clot in your lung.  A collapsed lung (pneumothorax). It can develop suddenly on its own (spontaneous pneumothorax) or from trauma to the chest.  Shingles infection (varicella-zoster virus).  Heart attack.  Damage to the bones, muscles, and cartilage that make up your chest wall. This can include:  Bruised bones due to injury.  Strained muscles or cartilage due to frequent or repeated coughing or overwork.  Fracture to one or more ribs.  Sore cartilage due to inflammation (costochondritis). RISK FACTORS  Risk factors for chest pain may include:  Activities that increase your risk for trauma or injury to your chest.  Respiratory infections or conditions that cause frequent coughing.  Medical conditions or overeating that can cause heartburn.  Heart disease or family history of heart disease.  Conditions or health behaviors that increase your risk of developing a blood clot.  Having had chicken pox (varicella zoster). SIGNS AND SYMPTOMS Chest pain can feel like:  Burning or tingling on the surface of your chest or deep in your chest.  Crushing, pressure, aching, or squeezing pain.  Dull  or sharp pain that is worse when you move, cough, or take a deep breath.  Pain that is also felt in your back, neck, shoulder, or arm, or pain that spreads to any of these areas. Your chest pain may come and go, or it may stay constant. DIAGNOSIS Lab tests or other studies may be needed to find the cause of your pain. Your health care provider may have you take a test called an ambulatory ECG (electrocardiogram). An ECG records your heartbeat patterns at the time the test is performed. You may also have other tests, such as:  Transthoracic echocardiogram (TTE). During echocardiography, sound waves are used to create a picture of all of the heart structures and to look at how blood flows through your heart.  Transesophageal echocardiogram (TEE).This is a more advanced imaging test that obtains images from inside your body. It allows your health care provider to see your heart in finer detail.  Cardiac monitoring. This allows your health care provider to monitor your heart rate and rhythm in real time.  Holter monitor. This is a portable device that records your heartbeat and can help to diagnose abnormal heartbeats. It allows your health care provider to track your heart activity for several days, if needed.  Stress tests. These can be done through exercise or by taking medicine that makes your heart beat more quickly.  Blood tests.  Imaging tests. TREATMENT  Your treatment depends on what is causing your chest pain. Treatment may include:  Medicines. These may include:  Acid blockers for heartburn.  Anti-inflammatory medicine.  Pain medicine for inflammatory conditions.  Antibiotic medicine, if an infection is present.  Medicines to dissolve blood clots.  Medicines to treat coronary artery disease.  Supportive care for conditions that do not require medicines. This may include:  Resting.  Applying heat or cold packs to injured areas.  Limiting activities until pain  decreases. HOME CARE INSTRUCTIONS  If you were prescribed an antibiotic medicine, finish it all even if you start to feel better.  Avoid any activities that bring on chest pain.  Do not use any tobacco products, including cigarettes, chewing tobacco, or electronic cigarettes. If you need help quitting, ask your health care provider.  Do not drink alcohol.  Take medicines only as directed by your health care provider.  Keep all follow-up visits as directed by your health care provider. This is important. This includes any further testing if your chest pain does not go away.  If heartburn is the cause for your chest pain, you may be told to keep your head raised (elevated) while sleeping. This reduces the chance that acid will go from your stomach into your esophagus.  Make lifestyle changes as directed by your health care provider. These may include:  Getting regular exercise. Ask your health care provider to suggest some activities that are safe for you.  Eating a heart-healthy diet. A registered dietitian can help you to learn healthy eating options.  Maintaining a healthy weight.  Managing diabetes, if necessary.  Reducing stress. SEEK MEDICAL CARE IF:  Your chest pain does not go away after treatment.  You have a rash with blisters on your chest.  You have a fever. SEEK IMMEDIATE MEDICAL CARE IF:   Your chest pain is worse.  You have an increasing cough, or you cough up blood.  You have severe abdominal pain.  You have severe weakness.  You faint.  You have chills.  You have sudden, unexplained chest discomfort.  You have sudden, unexplained discomfort in your arms, back, neck, or jaw.  You have shortness of breath at any time.  You suddenly start to sweat, or your skin gets clammy.  You feel nauseous or you vomit.  You suddenly feel light-headed or dizzy.  Your heart begins to beat quickly, or it feels like it is skipping beats. These symptoms may  represent a serious problem that is an emergency. Do not wait to see if the symptoms will go away. Get medical help right away. Call your local emergency services (911 in the [place] Do not drive yourself to the hospital.   This information is not intended to replace advice given to you by your health care provider. Make sure you discuss any questions you have with your health care provider.   Document Released: 04/24/2005 Document Revised: 08/05/2014 Document Reviewed: 02/18/2014 Elsevier Interactive Patient Education [employer].

## 2015-07-11 NOTE — ED Notes (Signed)
Pt ambulated to bathroom 

## 2015-07-11 NOTE — ED Provider Notes (Signed)
CSN: [id number]     Arrival date & time 07/11/15  1301 History   First MD Initiated Contact with Patient 07/11/15 1419     Chief Complaint  Patient presents with  . Chest Pain     Patient is a 81 y.o. female presenting with chest pain. The history is provided by the patient and a relative. No language interpreter was used.  Chest Pain  Gurpreet Jerilynn Mages Perdomo is a 81 y.o. female who presents to the Emergency Department complaining of chest pain.  She reports right sided chest pain, just below her breast that radiates to the right arm.  The pain began at 0200 and woke her from sleep.  The pain is constant in nature with no clear alleviating or worsening factors.  No change with ambulating, meals.  She has associated nausea, malaise/fatigue.  She has no shortness of breath, vomiting, fevers, cough, abdominal pain, leg edema.  She has a hx/o afib and take coumadin and is currently undergoing multiple medication changes.  Sxs are mild to moderate.  She is scheduled for cardioversion in six days.    Past Medical History  Diagnosis Date  . Persistent atrial fibrillation ([place])        . First degree AV block   . SVT (supraventricular tachycardia) (HCC)     possible atrial tachycardia, possible junctional tacycardia  . Hypertension   . Hypothyroidism   . Hyperlipidemia   . Gastroesophageal reflux disease   . Seasonal allergies   . Hx of echocardiogram     echo 07/2009:  mild focal basal septal hypertrophy, EF 55-60%, mild MR, mod LAE, mild RAE, PASP 45  . Squamous carcinoma ([place]) 2016    "right leg; right jawline"  . History of hiatal hernia   . Arthritis     "back; hands" (01/13/2015)  . Anxiety    Past Surgical History  Procedure Laterality Date  . Laparoscopic bilateral salpingo oopherectomy Bilateral ~ 2004  . Knee arthroscopy Right 1990's  . Abdominal hysterectomy  ~ 1975  . Cardioversion  01/13/2015    "didn't work"  . Appendectomy  ~ 1975  . Cataract extraction w/ intraocular lens   implant, bilateral Bilateral 2011  . Dilation and curettage of uterus    . Cardiac catheterization  1990's  . Squamous cell carcinoma excision  2016    "right leg; right jawline"  . Cardioversion N/A 01/13/2015    Procedure: CARDIOVERSION;  Surgeon: Josue Hector, MD;  Location: [facility] ENDOSCOPY;  Service: Cardiovascular;  Laterality: N/A;  . Cardioversion N/A 06/21/2015    Procedure: CARDIOVERSION;  Surgeon: Larey Dresser, MD;  Location: [facility] ENDOSCOPY;  Service: Cardiovascular;  Laterality: N/A;   Family History  Problem Relation Age of Onset  . Heart disease Father    Social History  Substance Use Topics  . Smoking status: Never Smoker   . Smokeless tobacco: Never Used  . Alcohol Use: No   OB History    No data available     Review of Systems  Cardiovascular: Positive for chest pain.  All other systems reviewed and are negative.     Allergies  Amiodarone; Dabigatran; and Sulfonamide derivatives  Home Medications   Prior to Admission medications   Medication Sig Start Date End Date Taking? Authorizing Provider  acetaminophen (TYLENOL) 500 MG tablet Take 500 mg by mouth every 8 (eight) hours as needed for mild pain or moderate pain.   Yes Historical Provider, MD  amiodarone (PACERONE) 200 MG tablet Take 200 mg  by mouth daily.   Yes Historical Provider, MD  calcium carbonate (TUMS - DOSED IN MG ELEMENTAL CALCIUM) 500 MG chewable tablet Chew 1 tablet by mouth daily as needed for indigestion or heartburn.   Yes Historical Provider, MD  cholecalciferol (VITAMIN D) 1000 UNITS tablet Take 1,000 Units by mouth daily.   Yes Historical Provider, MD  diltiazem (CARDIZEM CD) 180 MG 24 hr capsule Take 1 capsule (180 mg total) by mouth daily. 05/29/15  Yes Deboraha Sprang, MD  diltiazem (CARDIZEM) 30 MG tablet Take 1/2 - 1 tablet every 4 hours AS NEEDED for heart rate >100 as long as blood pressure >100 04/11/15  Yes Sherran Needs, NP  labetalol (NORMODYNE) 100 MG tablet Take 1 tablet  (100 mg total) by mouth 2 (two) times daily. 02/27/15  Yes Deboraha Sprang, MD  levothyroxine (SYNTHROID, LEVOTHROID) 50 MCG tablet Take 50 mcg by mouth daily.     Yes Historical Provider, MD  Melatonin 3 MG TABS Take 1 tablet by mouth at bedtime.   Yes Historical Provider, MD  Multiple Vitamins-Minerals (VITEYES AREDS FORMULA/LUTEIN) CAPS Take 1 capsule by mouth 2 (two) times daily.    Yes Historical Provider, MD  warfarin (COUMADIN) 2.5 MG tablet Take 2.5 mg by mouth daily at 6 PM.    Yes Historical Provider, MD   BP 165/69 mmHg  Pulse 100  Temp(Src) 98.4 F (36.9 C) (Oral)  Resp 11  SpO2 99% Physical Exam  Constitutional: She is oriented to person, place, and time. She appears well-developed and well-nourished.  HENT:  Head: Normocephalic and atraumatic.  Cardiovascular: Normal rate and regular rhythm.   No murmur heard. Pulmonary/Chest: Effort normal and breath sounds normal. No respiratory distress. She exhibits no tenderness.  Abdominal: Soft. There is no tenderness. There is no rebound and no guarding.  Musculoskeletal: She exhibits no edema or tenderness.  Neurological: She is alert and oriented to person, place, and time.  Skin: Skin is warm and dry.  Psychiatric: She has a normal mood and affect. Her behavior is normal.  Nursing note and vitals reviewed.   ED Course  Procedures (including critical care time) Labs Review Labs Reviewed  BASIC METABOLIC PANEL - Abnormal; Notable for the following:    Glucose, Bld 115 (*)    GFR calc non Af Amer 49 (*)    GFR calc Af Amer 57 (*)    All other components within normal limits  CBC - Abnormal; Notable for the following:    MCH 25.6 (*)    All other components within normal limits  PROTIME-INR - Abnormal; Notable for the following:    Prothrombin Time 40.8 (*)    INR 4.40 (*)    All other components within normal limits  I-STAT TROPOININ, ED    Imaging Review Dg Chest 2 View  07/11/2015  CLINICAL DATA:  RIGHT arm and  RIGHT chest pain under breast since 0230 hours today, hypertension, SVT, hiatal hernia EXAM: CHEST  2 VIEW COMPARISON:  12/12/2009 FINDINGS: Enlargement of cardiac silhouette. Atherosclerotic calcification aorta. Mediastinal contours and pulmonary vascularity normal. Calcified LEFT hilar and AP window adenopathy. Bronchitic changes with mild hyperinflation. Probable calcified granuloma LEFT upper lobe. No definite acute infiltrate, pleural effusion or pneumothorax. Bones demineralized. IMPRESSION: Enlargement of cardiac silhouette. Old granulomatous disease with probable COPD changes. No acute infiltrate. Electronically Signed   By: Lavonia Dana M.D.   On: 07/11/2015 14:09   I have personally reviewed and evaluated these images and lab results as  part of my medical decision-making.   EKG Interpretation   Date/Time:  Tuesday July 11 2015 13:14:02 EST Ventricular Rate:  81 PR Interval:    QRS Duration: 84 QT Interval:  408 QTC Calculation: 473 R Axis:   -24 Text Interpretation:  Atrial fibrillation Anteroseptal infarct , age  undetermined Abnormal ECG No significant change since last tracing  Confirmed by Hazle Coca [phone]) on 07/11/2015 1:58:36 PM      MDM   Final diagnoses:  Chest pain, unspecified chest pain type    Pt with hx/o atrial fibrillation, on coumadin here for evaluation of right sided chest pain that has been constant since 2am.  EKG unchanged from priors.  Abdominal examination is benign, presentation is not c/w pancreatitis, cholecystitis.  Presentation is not c/w PE.  INR is elevated, no current bleeding.  Pain is improved in department after maalox.  Discussed with pt likely indigestion as source of pain, discussed home care, outpatient follow up with cardiology as well as close return precautions.      Quintella Reichert, MD 07/11/15 [phone]

## 2015-07-11 NOTE — ED Notes (Signed)
Pt reports right sided cp since this morning at  0200 that woke her up from sleep. Pain radiates to right arm. Pt is constant. Denies n/v/d. Pt is a x 4. Has cardiac hx.

## 2015-07-12 ENCOUNTER — Telehealth: Payer: Self-pay | Admitting: Internal Medicine

## 2015-07-12 NOTE — Telephone Encounter (Signed)
New message     Calling to talk to the nurse about her upcoming procedure

## 2015-07-12 NOTE — Telephone Encounter (Signed)
Dr. Caryl Comes reviewed the information below. He called the patient and advised her that proceeding with the DCCV on 12/19 would be the right thing to do. He also advised her to decrease her warfarin to 1/2 tablet twice weekly and a whole tablet all the other days due to her amiodarone. She will need a repeat INR here prior to her DCCV on Monday with Dr. Caryl Comes. Will forward to [facility] to call and schedule the patient.

## 2015-07-12 NOTE — Telephone Encounter (Signed)
I spoke with the patient. She states that she was in the ER yesterday for right sided chest pain. This started about 2:30 am and she went to the ER about 12 hours later. She was given Maalox as her symptoms were felt to be GI in nature.  She states she was not given instant relief, but was symptom free when she woke up this morning. She states she feels tired today and wondered if she should proceed with the DCCV on 07/17/15 as planned with Dr. Caryl Comes. She wondered if it would be ok to wait until after the new year. I advised her it is important to try to get her back in NSR as this could be contributing to her symptoms in general. She tells me today that her SBP is running high 150- 180's at home. Her INR was checked yesterday at her PCP and she was 4.4.  She was told to hold her dose of warfarin last night, then resume. I advised her I would review the above with Dr. Caryl Comes and call her back. She is agreeable.

## 2015-07-17 ENCOUNTER — Ambulatory Visit (HOSPITAL_COMMUNITY)
Admission: RE | Admit: 2015-07-17 | Discharge: 2015-07-17 | Disposition: A | Discharge status: Home / Self Care | Payer: Medicare Other | Source: Ambulatory Visit | Attending: Internal Medicine | Admitting: Internal Medicine

## 2015-07-17 ENCOUNTER — Telehealth: Payer: Self-pay | Admitting: Internal Medicine

## 2015-07-17 ENCOUNTER — Ambulatory Visit (INDEPENDENT_AMBULATORY_CARE_PROVIDER_SITE_OTHER): Payer: Medicare Other | Admitting: Pharmacist

## 2015-07-17 ENCOUNTER — Encounter (HOSPITAL_COMMUNITY)
Admission: RE | Disposition: A | Discharge status: Home / Self Care | Payer: Self-pay | Source: Ambulatory Visit | Attending: Internal Medicine

## 2015-07-17 DIAGNOSIS — I4891 Unspecified atrial fibrillation: Secondary | ICD-10-CM | POA: Diagnosis not present

## 2015-07-17 DIAGNOSIS — I440 Atrioventricular block, first degree: Secondary | ICD-10-CM | POA: Diagnosis not present

## 2015-07-17 DIAGNOSIS — E785 Hyperlipidemia, unspecified: Secondary | ICD-10-CM | POA: Diagnosis not present

## 2015-07-17 DIAGNOSIS — F419 Anxiety disorder, unspecified: Secondary | ICD-10-CM | POA: Insufficient documentation

## 2015-07-17 DIAGNOSIS — I481 Persistent atrial fibrillation: Secondary | ICD-10-CM | POA: Diagnosis not present

## 2015-07-17 DIAGNOSIS — I471 Supraventricular tachycardia: Secondary | ICD-10-CM | POA: Diagnosis not present

## 2015-07-17 DIAGNOSIS — I10 Essential (primary) hypertension: Secondary | ICD-10-CM | POA: Insufficient documentation

## 2015-07-17 DIAGNOSIS — I4719 Other supraventricular tachycardia: Secondary | ICD-10-CM | POA: Diagnosis present

## 2015-07-17 DIAGNOSIS — M1990 Unspecified osteoarthritis, unspecified site: Secondary | ICD-10-CM | POA: Diagnosis not present

## 2015-07-17 DIAGNOSIS — E039 Hypothyroidism, unspecified: Secondary | ICD-10-CM | POA: Insufficient documentation

## 2015-07-17 DIAGNOSIS — Z7901 Long term (current) use of anticoagulants: Secondary | ICD-10-CM | POA: Insufficient documentation

## 2015-07-17 DIAGNOSIS — I4819 Other persistent atrial fibrillation: Secondary | ICD-10-CM

## 2015-07-17 DIAGNOSIS — Z882 Allergy status to sulfonamides status: Secondary | ICD-10-CM | POA: Diagnosis not present

## 2015-07-17 DIAGNOSIS — K219 Gastro-esophageal reflux disease without esophagitis: Secondary | ICD-10-CM | POA: Insufficient documentation

## 2015-07-17 HISTORY — PX: ELECTROPHYSIOLOGIC STUDY: SHX172A

## 2015-07-17 LAB — POCT INR: INR: 4.4

## 2015-07-17 SURGERY — CARDIOVERSION (CATH LAB)
Anesthesia: LOCAL

## 2015-07-17 MED ORDER — SODIUM CHLORIDE 0.9 % IV SOLN: 250.0000 mL | INTRAVENOUS | Status: DC

## 2015-07-17 MED ORDER — FENTANYL CITRATE (PF) 100 MCG/2ML IJ SOLN
INTRAMUSCULAR | Status: DC | PRN
  Administered 2015-07-17: 12.5 ug via INTRAVENOUS
  Administered 2015-07-17: 25 ug via INTRAVENOUS

## 2015-07-17 MED ORDER — DILTIAZEM HCL ER COATED BEADS 180 MG PO CP24: 180.0000 mg | ORAL_CAPSULE | Freq: Two times a day (BID) | ORAL | Status: DC

## 2015-07-17 MED ORDER — SODIUM CHLORIDE 0.9 % IJ SOLN: 3.0000 mL | INTRAMUSCULAR | Status: DC | PRN

## 2015-07-17 MED ORDER — MIDAZOLAM HCL 5 MG/5ML IJ SOLN
INTRAMUSCULAR | Status: DC | PRN
  Administered 2015-07-17: 2 mg via INTRAVENOUS
  Administered 2015-07-17: 1 mg via INTRAVENOUS

## 2015-07-17 MED ORDER — SODIUM CHLORIDE 0.9 % IJ SOLN: 3.0000 mL | Freq: Two times a day (BID) | INTRAMUSCULAR | Status: DC

## 2015-07-17 MED ORDER — FENTANYL CITRATE (PF) 100 MCG/2ML IJ SOLN
INTRAMUSCULAR | Status: AC
  Filled 2015-07-17: qty 2

## 2015-07-17 MED ORDER — MIDAZOLAM HCL 5 MG/5ML IJ SOLN
INTRAMUSCULAR | Status: AC
  Filled 2015-07-17: qty 5

## 2015-07-17 SURGICAL SUPPLY — 1 items: PAD DEFIB LIFELINK (PAD) ×2 IMPLANT

## 2015-07-17 NOTE — Progress Notes (Signed)
Pt reverted to atrial tach Will stop amio Will continue coumadin Have discussed AV ablation and Pacer She and her daughter will consider and call us after Christmas

## 2015-07-17 NOTE — Telephone Encounter (Signed)
New problem    Have a question concerning the quantity and directions for Diltiazem  180mg  that was faxed.

## 2015-07-17 NOTE — Interval H&P Note (Signed)
History and Physical Interval Note:  07/17/2015 11:46 AM  [name]  has presented today for surgery, with the diagnosis of afib  The various methods of treatment have been discussed with the patient and family. After consideration of risks, benefits and other options for treatment, the patient has consented to  Procedure(s): Cardioversion (N/A) as a surgical intervention .  The patient's history has been reviewed, patient examined, no change in status, stable for surgery.  I have reviewed the patient's chart and labs.  Questions were answered to the patient's satisfaction.     Virl Axe  Pt seen and examined Will proceed with DCCV now that she has been on amiodarone,  INR 4.4  Dose adjusted earlier today by coumadin clinic

## 2015-07-17 NOTE — CV Procedure (Signed)
Preop Dx atrial tach Post op DX  NSR   Procedure  DC Cardioversion   Pt was sedated by anesthesia receiving 3 versed & 37.5  Mg fentanyl A synchronized shock 360 joules restored sinus Rhythm   Pt tolerated without difficulty

## 2015-07-17 NOTE — Telephone Encounter (Signed)
LAST   SCRIPT DIRECTIONS WERE  DILTIAZEM 180 MG  BID    PHARMACY ONLY  RECEIVED  REFILL FOR  30  TABS NOT  60 .  60 TABS  OKAYED .

## 2015-07-17 NOTE — H&P (View-Only) (Signed)
Patient ID: [name], female   DOB: January 24, 1925, 81 y.o.   MRN: [id number] Patient ID: [name], female   DOB: 05-19-1925, 81 y.o.   MRN: [id number]     Primary Care Physician: Thressa Sheller, MD Referring Physician: Dr. Purvis Sheffield Blust is a 81 y.o. female with a h/o persistent afib that has now failed tikosyn, most recently had a short amiodarone  load,due to intolerance of higher  doses of amiodarone and failed cardioversion last week. She initially cardioverted but was in a tach, sedated and cardioverted again, with NSR with pac's. She had early return of afib  wiithin 2 days.She asked to be seen following failed cardioversion for next options. However, she doesn't have too many options left. She feels more tired in afib and cannot sleep at night. She is c/o constipation and wanted to stop amiodarone. We discussed reducing amiodarone to 200 mg a day, allowing for a longer slower load and trying cardioversion again. Also discussed AV ablation with PPM. I discussed with Dr. Caryl Comes and he was in favor of that plan.  She returns to the office today stating that she reduced amiodarone to 200 mg a day and felt worse, she increased amiodarone back  to 200 mg bid and felt better, thought she may have returned to SR for 2 days. Today in office she is in rate controlled afib. Still having issues sleeping. She is in favor of one more cardioversion after another week of amiodarone loading at 200 mg bid and if she has ERAF, will return to Dr. Caryl Comes to discuss AV ablation with PPM.   Today, she denies symptoms of palpitations, chest pain, shortness of breath, orthopnea, PND, lower extremity edema, dizziness, presyncope, syncope, or neurologic sequela. The patient is tolerating medications without difficulties and is otherwise without complaint today.   Past Medical History  Diagnosis Date  . Persistent atrial fibrillation ([place])        . First degree AV block   . SVT (supraventricular  tachycardia) (HCC)     possible atrial tachycardia, possible junctional tacycardia  . Hypertension   . Hypothyroidism   . Hyperlipidemia   . Gastroesophageal reflux disease   . Seasonal allergies   . Hx of echocardiogram     echo 07/2009:  mild focal basal septal hypertrophy, EF 55-60%, mild MR, mod LAE, mild RAE, PASP 45  . Squamous carcinoma ([place]) 2016    "right leg; right jawline"  . History of hiatal hernia   . Arthritis     "back; hands" (01/13/2015)  . Anxiety    Past Surgical History  Procedure Laterality Date  . Laparoscopic bilateral salpingo oopherectomy Bilateral ~ 2004  . Knee arthroscopy Right 1990's  . Abdominal hysterectomy  ~ 1975  . Cardioversion  01/13/2015    "didn't work"  . Appendectomy  ~ 1975  . Cataract extraction w/ intraocular lens  implant, bilateral Bilateral 2011  . Dilation and curettage of uterus    . Cardiac catheterization  1990's  . Squamous cell carcinoma excision  2016    "right leg; right jawline"  . Cardioversion N/A 01/13/2015    Procedure: CARDIOVERSION;  Surgeon: Josue Hector, MD;  Location: [facility] ENDOSCOPY;  Service: Cardiovascular;  Laterality: N/A;  . Cardioversion N/A 06/21/2015    Procedure: CARDIOVERSION;  Surgeon: Larey Dresser, MD;  Location: [place];  Service: Cardiovascular;  Laterality: N/A;    Current Outpatient Prescriptions  Medication Sig Dispense Refill  . acetaminophen (TYLENOL) 500 MG  tablet Take 500 mg by mouth every 8 (eight) hours as needed for mild pain or moderate pain.    [name] amiodarone (PACERONE) 200 MG tablet Take 400 mg by mouth daily.     . cholecalciferol (VITAMIN D) 1000 UNITS tablet Take 1,000 Units by mouth daily.    [name] diltiazem (CARDIZEM CD) 180 MG 24 hr capsule Take 1 capsule (180 mg total) by mouth daily. 30 capsule 11  . diltiazem (CARDIZEM) 30 MG tablet Take 1/2 - 1 tablet every 4 hours AS NEEDED for heart rate >100 as long as blood pressure >100 30 tablet 1  . fexofenadine (ALLEGRA) 180 MG tablet  Take 180 mg by mouth daily as needed for allergies.     [name] labetalol (NORMODYNE) 100 MG tablet Take 1 tablet (100 mg total) by mouth 2 (two) times daily. 180 tablet 2  . levothyroxine (SYNTHROID, LEVOTHROID) 50 MCG tablet Take 50 mcg by mouth daily.      . Melatonin 3 MG TABS Take 1 tablet by mouth at bedtime.    . Multiple Vitamins-Minerals (VITEYES AREDS FORMULA/LUTEIN) CAPS Take 1 capsule by mouth daily.     [name] omeprazole (PRILOSEC OTC) 20 MG tablet Take 20 mg by mouth daily as needed. For heartburn    . warfarin (COUMADIN) 2.5 MG tablet Take 2.5 mg by mouth daily.     . [DISCONTINUED] pantoprazole (PROTONIX) 20 MG tablet Take 1 tablet (20 mg total) by mouth daily. 30 tablet 11  . [DISCONTINUED] pravastatin (PRAVACHOL) 80 MG tablet Take 40 mg by mouth Daily.     . [DISCONTINUED] spironolactone (ALDACTONE) 25 MG tablet Take 1/2 tablet by mouth once daily     No current facility-administered medications for this encounter.    Allergies  Allergen Reactions  . Amiodarone Nausea And Vomiting    Only at higher dose.  Able to tolerate lower dose  . Dabigatran     Unknown, per pt   . Sulfonamide Derivatives     REACTION: hives    Social History   Social History  . Marital Status: Widowed    Spouse Name: N/A  . Number of Children: N/A  . Years of Education: N/A   Occupational History  . Not on file.   Social History Main Topics  . Smoking status: Never Smoker   . Smokeless tobacco: Never Used  . Alcohol Use: No  . Drug Use: No  . Sexual Activity: No   Other Topics Concern  . Not on file   Social History Narrative   She has two children, two grandchildren and three great grandchildren    Family History  Problem Relation Age of Onset  . Heart disease Father     ROS- All systems are reviewed and negative except as per the HPI above  Physical Exam: Filed Vitals:   07/06/15 1107  BP: 162/80  Pulse: 83  Height: 5\' 5"  (1.651 m)  Weight: 115 lb 9.6 oz (52.436 kg)     GEN- The patient is well appearing, alert and oriented x 3 today.   Head- normocephalic, atraumatic Eyes-  Sclera clear, conjunctiva pink Ears- hearing intact Oropharynx- clear Neck- supple, no JVP Lymph- no cervical lymphadenopathy Lungs- Clear to ausculation bilaterally, normal work of breathing Heart- Irregular rate and rhythm, no murmurs, rubs or gallops, PMI not laterally displaced GI- soft, NT, ND, + BS Extremities- no clubbing, cyanosis, or edema MS- no significant deformity or atrophy Skin- no rash or lesion Psych- euthymic mood, full affect Neuro- strength  and sensation are intact  EKG- afib at 78 bpm, qrs 82 ms, qtc 440 ms Epic records reviewed  Assessment and Plan: 1.Persistent afib Failed tikosyn Failed short amio load and cardioversion 11/23 Allow longer load at amiodarone 200 mg bid and attempt cardioversion on 12/19, will try to schedule with Dr. Caryl Comes if possible. If ERAF, she will return to Dr. Caryl Comes to discuss AV nodal ablation Bmet/CBC/INR today Was therapeutic prior to last cardioversion, INR 11/16- 2.6, 12-8, 3.0 and today, 3.24 and will be checked am of procedure.  F/u one week after cardioversion in afib clinic  Arisa Congleton C. [facility] [address] [place], [place] [address] [phone]  In one note he discussed av node ablation/PPM as last option.

## 2015-07-17 NOTE — Patient Instructions (Signed)
INR today: 4.4  Skip Coumadin today (12/19) then decrease your dose to 1 tablet every day except 1/2 tablet on Tuesday, Thursday and Saturday.  Recheck INR in 1 week.

## 2015-07-17 NOTE — Discharge Instructions (Signed)
Electrical Cardioversion, Care After °Refer to this sheet in the next few weeks. These instructions provide you with information on caring for yourself after your procedure. Your health care provider may also give you more specific instructions. Your treatment has been planned according to current medical practices, but problems sometimes occur. Call your health care provider if you have any problems or questions after your procedure. °WHAT TO EXPECT AFTER THE PROCEDURE °After your procedure, it is typical to have the following sensations: °· Some redness on the skin where the shocks were delivered. If this is tender, a sunburn lotion or hydrocortisone cream may help. °· Possible return of an abnormal heart rhythm within hours or days after the procedure. °HOME CARE INSTRUCTIONS °· Take medicines only as directed by your health care provider. Be sure you understand how and when to take your medicine. °· Learn how to feel your pulse and check it often. °· Limit your activity for 48 hours after the procedure or as directed by your health care provider. °· Avoid or minimize caffeine and other stimulants as directed by your health care provider. °SEEK MEDICAL CARE IF: °· You feel like your heart is beating too fast or your pulse is not regular. °· You have any questions about your medicines. °· You have bleeding that will not stop. °SEEK IMMEDIATE MEDICAL CARE IF: °· You are dizzy or feel faint. °· It is hard to breathe or you feel short of breath. °· There is a change in discomfort in your chest. °· Your speech is slurred or you have trouble moving an arm or leg on one side of your body. °· You get a serious muscle cramp that does not go away. °· Your fingers or toes turn cold or blue. °  °This information is not intended to replace advice given to you by your health care provider. Make sure you discuss any questions you have with your health care provider. °  °Document Released: 05/05/2013 Document Revised: 08/05/2014  Document Reviewed: 05/05/2013 °Elsevier Interactive Patient Education ©2016 [employer] ° °

## 2015-07-17 NOTE — Progress Notes (Signed)
Dr Caryl Comes in to see pt and talk to her and her daughter about the results of her EKG and the plan of care.  Discharge instruction given pr MD order.  Pt and CG were able to verbalize understanding.  Pt to car via wheelchair.

## 2015-07-19 ENCOUNTER — Encounter (HOSPITAL_COMMUNITY): Payer: Self-pay | Admitting: Internal Medicine

## 2015-07-19 ENCOUNTER — Telehealth: Payer: Self-pay | Admitting: Internal Medicine

## 2015-07-19 NOTE — Telephone Encounter (Signed)
Attempted to touch base with Dr. Caryl Comes to clarify what is going on with the patient and her medications post procedure. Waiting on a call back.

## 2015-07-19 NOTE — Telephone Encounter (Signed)
New Message   Pt states that she was told to decrease her medications   and she needs rn to help her understand what that mean

## 2015-07-19 NOTE — Telephone Encounter (Signed)
Reviewed the patient's medication changes with Dr. Caryl Comes as the only change I could see was to discontinue amiodarone. Per Dr. Caryl Comes- discontinue amiodarone, increase diltiazem to 180 mg BID, and keep labetalol the same for now at 100 mg BID. The patient verbalizes understanding of the changes in her medications. She states she is ready to schedule her PPM implant. I asked, just to clarify, if she was wanting to proceed with AV node ablation as well. She states she needs to speak with Dr. Caryl Comes a little further. I advised her I will try to get him to call and speak with her on the phone when he is back in the office next week, then we can proceed from there. She is agreeable.

## 2015-07-25 ENCOUNTER — Ambulatory Visit (INDEPENDENT_AMBULATORY_CARE_PROVIDER_SITE_OTHER): Payer: Medicare Other | Admitting: Pharmacist

## 2015-07-25 ENCOUNTER — Telehealth: Payer: Self-pay | Admitting: Internal Medicine

## 2015-07-25 DIAGNOSIS — I4819 Other persistent atrial fibrillation: Secondary | ICD-10-CM

## 2015-07-25 DIAGNOSIS — I481 Persistent atrial fibrillation: Secondary | ICD-10-CM | POA: Diagnosis not present

## 2015-07-25 DIAGNOSIS — Z5181 Encounter for therapeutic drug level monitoring: Secondary | ICD-10-CM

## 2015-07-25 DIAGNOSIS — Z7901 Long term (current) use of anticoagulants: Secondary | ICD-10-CM | POA: Diagnosis not present

## 2015-07-25 LAB — POCT INR: INR: 3.5

## 2015-07-25 NOTE — Telephone Encounter (Signed)
New message     Patient calling   Pt C/O medication issue:  1. Name of Medication: diltiazem 180 mg two time a day   2. How are you currently taking this medication (dosage and times per day)? Two time a day in am  & pm   3. Are you having a reaction (difficulty breathing--STAT)? Unable to sleep, dizzy, lightheaded   4. What is your medication issue? Wants to discuss with nurse.

## 2015-07-25 NOTE — Telephone Encounter (Signed)
I called and spoke with the patient. She reports that since increasing the diltiazem to 180 mg BID, her BP's have been improved, but she is unable to sleep at night. BP has been 153/91, 133/82, 143/81. She is having some occasional dizziness/ lightheadedness during the day. I advised her I would review with Dr. Caryl Comes and call her back. She is pending PPM/ AV node ablation, but wanted to talk with Dr. Caryl Comes a little further about the ablation. I advised her I would try to have him call her tomorrow if possible. She is agreeable.

## 2015-07-28 NOTE — Telephone Encounter (Signed)
Dr. Caryl Comes called patient this morning to discuss concerns.  Patient and he decided to leave things the way they are right now.  Agreed that no decisions will be made on procedure at this time and will revisit this in 3-4 weeks in office visit. Will have scheduler call her to arrange OV w/ Caryl Comes in the next 3-4 weeks.  Patient was agreeable to above stated plan.

## 2015-08-02 ENCOUNTER — Telehealth: Payer: Self-pay | Admitting: Internal Medicine

## 2015-08-02 NOTE — Telephone Encounter (Signed)
Pt c/o BP issue: STAT if pt c/o blurred vision, one-sided weakness or slurred speech  1. What are your last 5 BP readings? 128/90 (2pm1/4/17)  2. Are you having any other symptoms (ex. Dizziness, headache, blurred vision, passed out)? Dizziness, lightheaded  3. What is your BP issue? BP too low

## 2015-08-02 NOTE — Telephone Encounter (Signed)
A couple of alternatives. 1-decrease the diltiazem to- 180 once a day again 2-trying intermediate dose of 240. 3-trying alternative drugs like verapamil at 180 mg. The advantage of the latter is that it is a different drug. The disadvantage is that it can be associated with constipation  I don't have a preference as to which she chooses

## 2015-08-02 NOTE — Telephone Encounter (Signed)
Patient complaining of low bp with dizziness and lightheadedness. Patient stated that her Diltiazem was increased 2 weeks ago and she does not think she is tolerating this change. Patient's stated BP 128/70 and HR 69. Informed patient that these are good readings. Encouraged patient to slowly change positions when going from sitting to standing, drink some fluids, and while sitting elevate her feet. Will forward to [facility], Dr. Olin Pia nurse and Dr. Caryl Comes for advisement.

## 2015-08-04 NOTE — Telephone Encounter (Signed)
Attempted to call the patient- busy signal x 2 tries.

## 2015-08-04 NOTE — Addendum Note (Signed)
Addended by: Alvis Lemmings C on: 08/04/2015 05:41 PM   Modules accepted: Orders

## 2015-08-04 NOTE — Telephone Encounter (Signed)
The patient is aware of Dr. Olin Pia recommendations. She will decrease diltiazem to 180 mg once daily to start and see how she does.

## 2015-08-10 ENCOUNTER — Ambulatory Visit (INDEPENDENT_AMBULATORY_CARE_PROVIDER_SITE_OTHER): Payer: Medicare Other | Admitting: *Deleted

## 2015-08-10 DIAGNOSIS — I481 Persistent atrial fibrillation: Secondary | ICD-10-CM | POA: Diagnosis not present

## 2015-08-10 DIAGNOSIS — I4819 Other persistent atrial fibrillation: Secondary | ICD-10-CM

## 2015-08-10 DIAGNOSIS — Z7901 Long term (current) use of anticoagulants: Secondary | ICD-10-CM | POA: Diagnosis not present

## 2015-08-10 DIAGNOSIS — Z5181 Encounter for therapeutic drug level monitoring: Secondary | ICD-10-CM | POA: Diagnosis not present

## 2015-08-10 LAB — POCT INR: INR: 2.1

## 2015-08-25 ENCOUNTER — Ambulatory Visit (INDEPENDENT_AMBULATORY_CARE_PROVIDER_SITE_OTHER): Payer: Medicare Other | Admitting: Surgery

## 2015-08-25 ENCOUNTER — Ambulatory Visit (INDEPENDENT_AMBULATORY_CARE_PROVIDER_SITE_OTHER): Payer: Medicare Other | Admitting: Internal Medicine

## 2015-08-25 ENCOUNTER — Encounter: Payer: Self-pay | Admitting: Internal Medicine

## 2015-08-25 ENCOUNTER — Ambulatory Visit (INDEPENDENT_AMBULATORY_CARE_PROVIDER_SITE_OTHER)
Admission: RE | Admit: 2015-08-25 | Discharge: 2015-08-25 | Disposition: A | Discharge status: Home / Self Care | Payer: Medicare Other | Source: Ambulatory Visit | Attending: Internal Medicine | Admitting: Internal Medicine

## 2015-08-25 VITALS — BP 160/82 | HR 80 | Ht 62.0 in | Wt 114.8 lb

## 2015-08-25 DIAGNOSIS — I4819 Other persistent atrial fibrillation: Secondary | ICD-10-CM

## 2015-08-25 DIAGNOSIS — Z5181 Encounter for therapeutic drug level monitoring: Secondary | ICD-10-CM | POA: Diagnosis not present

## 2015-08-25 DIAGNOSIS — I481 Persistent atrial fibrillation: Secondary | ICD-10-CM

## 2015-08-25 DIAGNOSIS — I480 Paroxysmal atrial fibrillation: Secondary | ICD-10-CM

## 2015-08-25 DIAGNOSIS — R51 Headache: Secondary | ICD-10-CM

## 2015-08-25 DIAGNOSIS — R519 Headache, unspecified: Secondary | ICD-10-CM

## 2015-08-25 DIAGNOSIS — R42 Dizziness and giddiness: Secondary | ICD-10-CM | POA: Diagnosis not present

## 2015-08-25 DIAGNOSIS — Z7901 Long term (current) use of anticoagulants: Secondary | ICD-10-CM

## 2015-08-25 LAB — POCT INR: INR: 2.8

## 2015-08-25 NOTE — Patient Instructions (Signed)
Medication Instructions: 1) Stop Labetalol  X 2 weeks- if you are feeling better, then call and let Dr. Caryl Comes know 2) If you are not feeling any better, then restart Labetolol and STOP Diltiazem (Cardizem) x 2 weeks  Labwork: - none  Procedures/Testing: - Non-Cardiac CT scanning, (CAT scanning), is a noninvasive, special x-ray that produces cross-sectional images of the body using x-rays and a computer. CT scans help physicians diagnose and treat medical conditions. For some CT exams, a contrast material is used to enhance visibility in the area of the body being studied. CT scans provide greater clarity and reveal more details than regular x-ray exams.- Head CT- non-contrast  Follow-Up: - Your physician recommends that you schedule a follow-up appointment in: 2 months with Dr. Caryl Comes.  Any Additional Special Instructions Will Be Listed Below (If Applicable).     If you need a refill on your cardiac medications before your next appointment, please call your pharmacy.

## 2015-08-25 NOTE — Progress Notes (Signed)
Patient Care Team: Thressa Sheller, MD as PCP - General (Internal Medicine)   HPI  Antony Blackbird Butchko is a 82 y.o. female  seen in followup for paroxysmal atrial fibrillation for which she took Tikosyn.    She underwent unsuccessful cardioversion 7/16 and has been seen in the atrial fibrillation clinic a number of times over the last couple of months. She has had recurrence of atrial flutter as well as fibrillation. These have been quite problematic wise.  Possibility we should probably today so that we know that they are performing exposure to the drug been raised about AV junction ablation and pacing   she did not tolerate the amiodarone at higher dose; we have stopped it    She still feels lousy  Afib seems to be well controlled        Past Medical History  Diagnosis Date  . Persistent atrial fibrillation ([place])        . First degree AV block   . SVT (supraventricular tachycardia) (H[facility])     possible atrial tachycardia, possible junctional tacycardia  . Hypertension   . Hypothyroidism   . Hyperlipidemia   . Gastroesophageal reflux disease   . Seasonal allergies   . Hx of echocardiogram     echo 07/2009:  mild focal basal septal hypertrophy, EF 55-60%, mild MR, mod LAE, mild RAE, PASP 45  . Squamous carcinoma ([place]) 2016    "right leg; right jawline"  . History of hiatal hernia   . Arthritis     "back; hands" (01/13/2015)  . Anxiety     Past Surgical History  Procedure Laterality Date  . Laparoscopic bilateral salpingo oopherectomy Bilateral ~ 2004  . Knee arthroscopy Right 1990's  . Abdominal hysterectomy  ~ 1975  . Cardioversion  01/13/2015    "didn't work"  . Appendectomy  ~ 1975  . Cataract extraction w/ intraocular lens  implant, bilateral Bilateral 2011  . Dilation and curettage of uterus    . Cardiac catheterization  1990's  . Squamous cell carcinoma excision  2016    "right leg; right jawline"  . Cardioversion N/A 01/13/2015    Procedure: CARDIOVERSION;  Surgeon:  Josue Hector, MD;  Location: [facility] ENDOSCOPY;  Service: Cardiovascular;  Laterality: N/A;  . Cardioversion N/A 06/21/2015    Procedure: CARDIOVERSION;  Surgeon: Larey Dresser, MD;  Location: [place];  Service: Cardiovascular;  Laterality: N/A;  . Electrophysiologic study N/A 07/17/2015    Procedure: Cardioversion;  Surgeon: Deboraha Sprang, MD;  Location: [place] [facility];  Service: Cardiovascular;  Laterality: N/A;    Current Outpatient Prescriptions  Medication Sig Dispense Refill  . acetaminophen (TYLENOL) 500 MG tablet Take 500 mg by mouth every 8 (eight) hours as needed for mild pain or moderate pain.    . calcium carbonate (TUMS - DOSED IN MG ELEMENTAL CALCIUM) 500 MG chewable tablet Chew 1 tablet by mouth daily as needed for indigestion or heartburn.    . cholecalciferol (VITAMIN D) 1000 UNITS tablet Take 1,000 Units by mouth daily.    [name] diltiazem (CARDIZEM CD) 180 MG 24 hr capsule Take 1 capsule (180 mg total) by mouth daily.    [name] diltiazem (CARDIZEM) 30 MG tablet Take 1/2 - 1 tablet every 4 hours AS NEEDED for heart rate >100 as long as blood pressure >100 30 tablet 1  . labetalol (NORMODYNE) 100 MG tablet Take 1 tablet (100 mg total) by mouth 2 (two) times daily. 180 tablet 2  . levothyroxine (SYNTHROID, LEVOTHROID)  50 MCG tablet Take 50 mcg by mouth daily.      . Melatonin 3 MG TABS Take 1 tablet by mouth at bedtime.    . Multiple Vitamins-Minerals (VITEYES AREDS FORMULA/LUTEIN) CAPS Take 1 capsule by mouth 2 (two) times daily.     [name] warfarin (COUMADIN) 2.5 MG tablet Take 2.5 mg by mouth daily at 6 PM.     . [DISCONTINUED] pantoprazole (PROTONIX) 20 MG tablet Take 1 tablet (20 mg total) by mouth daily. 30 tablet 11  . [DISCONTINUED] pravastatin (PRAVACHOL) 80 MG tablet Take 40 mg by mouth Daily.     . [DISCONTINUED] spironolactone (ALDACTONE) 25 MG tablet Take 1/2 tablet by mouth once daily     No current facility-administered medications for this visit.    Allergies   Allergen Reactions  . Amiodarone Nausea And Vomiting    Only at higher dose.  Able to tolerate lower dose  . Dabigatran Other (See Comments)    Unknown, per pt   . Sulfonamide Derivatives     REACTION: hives    Review of Systems negative except from HPI and PMH  PE BP 160/82 mmHg  Pulse 80  Ht 5\' 2"  (1.575 m)  Wt 114 lb 12.8 oz (52.073 kg)  BMI 20.99 kg/m2  SpO2 97% Well developed and well nourished in no acute distress HENT normal E scleral and icterus clear Neck Supple JVP flat; carotids brisk and full Clear to ausculation  Irregularly irregular    rhythm, no murmurs gallops or rub Soft with active bowel sounds No clubbing cyanosis n Edema Alert and oriented, grossly normal motor and sensory function Skin Warm and Dry     ECG  was ordered today and demonstrated atrial flutter  with variable conduction. Rate is 85 Intervals-/08/40   Assessment an  Plan  Atrial fibrillation-persistent  Atrial tachycardia -recurrent   Hypertension well controlled   Fatigue  Headache  Her heart rate seems to be relatively well controlled. The lassitude may be drug-related so we will take a serial elimination trial of her labetalol for 2 weeks and then her diltiazem for 2 weeks.  Her headaches in the context of anticoagulation are concerning. We will undertake noncontrast CT scan  We spent more than 50% of our >25 min visit in face to face counseling regarding the above

## 2015-08-31 ENCOUNTER — Ambulatory Visit (INDEPENDENT_AMBULATORY_CARE_PROVIDER_SITE_OTHER): Payer: Medicare Other | Admitting: Physician Assistant

## 2015-08-31 VITALS — BP 148/100 | HR 97 | Temp 97.7°F | Resp 20 | Ht 62.0 in | Wt 116.4 lb

## 2015-08-31 DIAGNOSIS — K5900 Constipation, unspecified: Secondary | ICD-10-CM

## 2015-08-31 DIAGNOSIS — K5909 Other constipation: Secondary | ICD-10-CM | POA: Diagnosis not present

## 2015-08-31 NOTE — Progress Notes (Signed)
Gentrie Jindal Maack  MRN: [id number] DOB: 09/24/1924  Subjective:  Pt presents to clinic with constipation and the inability to have a BM over the last 2 days.  She had a hard golf ball stool 2 days ago and feels like she needs to go but has not been able to.  She tried to do an enema today but it was not successful.  Since then she has had some leakage but not a full stool.  She typically has hard to pass golf ball size stools.  She takes miralax daily but she drinks less than 20oz fluid a day.  She eats fruits and veggies daily.  She feels like she has always had problems with constipation.  She thinks that she is getting hemorrhoids also.  Patient Active Problem List   Diagnosis Date Noted  . Atrial tachycardia ([place]) 01/13/2015  . Anticoagulated on Coumadin 11/20/2012  . Chest pain 02/10/2012  . Hyperlipidemia 05/28/2010  . HYPERTENSION, BENIGN 10/31/2009  . Atrial fibrillation ([place]) 10/31/2009  . SVT/ PSVT/ PAT 09/04/2009    Current Outpatient Prescriptions on File Prior to Visit  Medication Sig Dispense Refill  . acetaminophen (TYLENOL) 500 MG tablet Take 500 mg by mouth every 8 (eight) hours as needed for mild pain or moderate pain.    . calcium carbonate (TUMS - DOSED IN MG ELEMENTAL CALCIUM) 500 MG chewable tablet Chew 1 tablet by mouth daily as needed for indigestion or heartburn.    . cholecalciferol (VITAMIN D) 1000 UNITS tablet Take 1,000 Units by mouth daily.    [name] diltiazem (CARDIZEM CD) 180 MG 24 hr capsule Take 1 capsule (180 mg total) by mouth daily.    [name] diltiazem (CARDIZEM) 30 MG tablet Take 1/2 - 1 tablet every 4 hours AS NEEDED for heart rate >100 as long as blood pressure >100 30 tablet 1  . labetalol (NORMODYNE) 100 MG tablet Take 1 tablet (100 mg total) by mouth 2 (two) times daily. 180 tablet 2  . levothyroxine (SYNTHROID, LEVOTHROID) 50 MCG tablet Take 50 mcg by mouth daily.      . Melatonin 3 MG TABS Take 1 tablet by mouth at bedtime.    . Multiple Vitamins-Minerals  (VITEYES AREDS FORMULA/LUTEIN) CAPS Take 1 capsule by mouth 2 (two) times daily.     [name] warfarin (COUMADIN) 2.5 MG tablet Take 2.5 mg by mouth daily at 6 PM.     . [DISCONTINUED] pantoprazole (PROTONIX) 20 MG tablet Take 1 tablet (20 mg total) by mouth daily. 30 tablet 11  . [DISCONTINUED] pravastatin (PRAVACHOL) 80 MG tablet Take 40 mg by mouth Daily.     . [DISCONTINUED] spironolactone (ALDACTONE) 25 MG tablet Take 1/2 tablet by mouth once daily     No current facility-administered medications on file prior to visit.    Allergies  Allergen Reactions  . Amiodarone Nausea And Vomiting    Only at higher dose.  Able to tolerate lower dose  . Dabigatran Other (See Comments)    Unknown, per pt   . Sulfonamide Derivatives     REACTION: hives    Review of Systems  Gastrointestinal: Positive for constipation.   Objective:  BP 148/100 mmHg  Pulse 97  Temp(Src) 97.7 F (36.5 C) (Oral)  Resp 20  Ht 5\' 2"  (1.575 m)  Wt 116 lb 6.4 oz (52.799 kg)  BMI 21.28 kg/m2  SpO2 98%  Physical Exam  Constitutional: She is oriented to person, place, and time and well-developed, well-nourished, and in no distress.  HENT:  Head: Normocephalic and atraumatic.  Right Ear: Hearing and external ear normal.  Left Ear: Hearing and external ear normal.  Eyes: Conjunctivae are normal.  Neck: Normal range of motion.  Pulmonary/Chest: Effort normal.  Neurological: She is alert and oriented to person, place, and time. Gait normal.  Skin: Skin is warm and dry.  Psychiatric: Mood, memory, affect and judgment normal.  Vitals reviewed.   Assessment and Plan :  Constipation, unspecified constipation type   D/w pt at length how to deal with her constipation.  She had a stool 48h ago and she did not tolerate the fecal disimpaction well last time.  She has tried an enema today but was unable to get on the floor and do it - therefore I think that is why it was unsuccessful.  We discussed ways to do a successful  enema - she will also try glycerin suppositories as that may be easier for her.  I am also concerned that she does not drink enough fluids with the miralax and that may be part of her problem with hard stool - we talked about adding colace daily to soft the stool - she will also start with a fiber pudding to help with her daily stools.  Windell Hummingbird PA-C  [facility] 08/31/2015 6:43 PM

## 2015-08-31 NOTE — Patient Instructions (Signed)
Power Pudding Recipe with 3 main ingredients  1 cup applesauce  3/4 cup prune juice  1 cup of unprocessed wheat bran Mix together in a small storage container; applesauce, prune juice and wheat bran until it forms a pasty pudding. Take one tablespoon every morning followed by a full glass of water. Drink some water throughout the day.   Tonight- Glycerin suppositories - to help softner the stool - do this tonight Enema - do them until you have a stool - a good one  Future -  Colace - for the next 2 weeks - take 2-3 capsules a day - change dose based on how hard your stool is Prunes and prune juice - are ok Miralax - no more than 1 capful a day - once your stool gets soft  - decrease to 1/2 capful -

## 2015-09-01 ENCOUNTER — Telehealth: Payer: Self-pay | Admitting: Physician Assistant

## 2015-09-01 NOTE — Telephone Encounter (Signed)
Spoke with patient - she did an enema this am and produced a BM - she plans on using another glycerin suppository and enema this am.  She will contact us if she has any problems.

## 2015-09-14 ENCOUNTER — Ambulatory Visit (INDEPENDENT_AMBULATORY_CARE_PROVIDER_SITE_OTHER): Payer: Medicare Other | Admitting: Pharmacist

## 2015-09-14 DIAGNOSIS — Z5181 Encounter for therapeutic drug level monitoring: Secondary | ICD-10-CM | POA: Diagnosis not present

## 2015-09-14 DIAGNOSIS — I4819 Other persistent atrial fibrillation: Secondary | ICD-10-CM

## 2015-09-14 DIAGNOSIS — I480 Paroxysmal atrial fibrillation: Secondary | ICD-10-CM | POA: Diagnosis not present

## 2015-09-14 DIAGNOSIS — I481 Persistent atrial fibrillation: Secondary | ICD-10-CM

## 2015-09-14 DIAGNOSIS — Z7901 Long term (current) use of anticoagulants: Secondary | ICD-10-CM | POA: Diagnosis not present

## 2015-09-14 LAB — POCT INR: INR: 1.8

## 2015-10-12 ENCOUNTER — Ambulatory Visit (INDEPENDENT_AMBULATORY_CARE_PROVIDER_SITE_OTHER): Payer: Medicare Other | Admitting: Pharmacist

## 2015-10-12 DIAGNOSIS — Z5181 Encounter for therapeutic drug level monitoring: Secondary | ICD-10-CM | POA: Diagnosis not present

## 2015-10-12 DIAGNOSIS — I481 Persistent atrial fibrillation: Secondary | ICD-10-CM | POA: Diagnosis not present

## 2015-10-12 DIAGNOSIS — Z7901 Long term (current) use of anticoagulants: Secondary | ICD-10-CM

## 2015-10-12 DIAGNOSIS — I4819 Other persistent atrial fibrillation: Secondary | ICD-10-CM

## 2015-10-12 DIAGNOSIS — I480 Paroxysmal atrial fibrillation: Secondary | ICD-10-CM | POA: Diagnosis not present

## 2015-10-12 LAB — POCT INR: INR: 3.2

## 2015-10-23 ENCOUNTER — Encounter: Payer: Self-pay | Admitting: Internal Medicine

## 2015-10-23 ENCOUNTER — Ambulatory Visit (INDEPENDENT_AMBULATORY_CARE_PROVIDER_SITE_OTHER): Payer: Medicare Other | Admitting: Pharmacist

## 2015-10-23 ENCOUNTER — Ambulatory Visit (INDEPENDENT_AMBULATORY_CARE_PROVIDER_SITE_OTHER): Payer: Medicare Other | Admitting: Internal Medicine

## 2015-10-23 VITALS — BP 184/96 | HR 81 | Ht 62.0 in | Wt 113.8 lb

## 2015-10-23 DIAGNOSIS — Z7901 Long term (current) use of anticoagulants: Secondary | ICD-10-CM

## 2015-10-23 DIAGNOSIS — Z5181 Encounter for therapeutic drug level monitoring: Secondary | ICD-10-CM

## 2015-10-23 DIAGNOSIS — I481 Persistent atrial fibrillation: Secondary | ICD-10-CM

## 2015-10-23 DIAGNOSIS — I4819 Other persistent atrial fibrillation: Secondary | ICD-10-CM

## 2015-10-23 DIAGNOSIS — I471 Supraventricular tachycardia: Secondary | ICD-10-CM

## 2015-10-23 LAB — POCT INR: INR: 2.3

## 2015-10-23 NOTE — Patient Instructions (Signed)
Medication Instructions: 1) Take diltiazem 180 mg at night 2) Increase labetalol to 100 mg take two tablets (200 mg) by mouth twice daily. If your morning blood pressure is < 120 on the top number, then only take one tablet (100 mg) in the morning)  Labwork: - none  Procedures/Testing: - none  Follow-Up: - Your physician recommends that you schedule a follow-up appointment in: 6 weeks with Roderic Palau, NP in the a-fib clinic.  Any Additional Special Instructions Will Be Listed Below (If Applicable).     If you need a refill on your cardiac medications before your next appointment, please call your pharmacy.

## 2015-10-23 NOTE — Progress Notes (Signed)
Patient Care Team: Thressa Sheller, MD as PCP - General (Internal Medicine)   HPI  [name] is a 82 y.o. female  seen in followup for paroxysmal atrial fibrillation for which she took Tikosyn.    She underwent unsuccessful cardioversion 7/16 and has been seen in the atrial fibrillation clinic a number of times over the last couple of months. She has had recurrence of atrial flutter as well as fibrillation. These have been quite problematic wise.  We have discussed AV junction ablation and pacing in the past.   she did not tolerate the amiodarone at higher dose; we have stopped it      Antiarrhythmics Date   dofetilide 2013---9/16 failed  amiodarone 11/16/  Not tolerated    most recently cardioverted 12/16 with rapid reversion to atrial flutter. After she was seen in the office recently, we tried cereal elimination without clear evidence that either drug or worse but she was most notably affected when her Cardizem was discontinued;  after a couple of weeks her symptoms worsened with "more A. fib". It is very disruptive of her sleep. She is struggled with sleep in the past and actually took Xanax in the past that resulted in dependence  Blood pressures at home have ranged from 120--to 10       Past Medical History  Diagnosis Date  . Persistent atrial fibrillation ([place])        . First degree AV block   . SVT (supraventricular tachycardia) (H[facility])     possible atrial tachycardia, possible junctional tacycardia  . Hypertension   . Hypothyroidism   . Hyperlipidemia   . Gastroesophageal reflux disease   . Seasonal allergies   . Hx of echocardiogram     echo 07/2009:  mild focal basal septal hypertrophy, EF 55-60%, mild MR, mod LAE, mild RAE, PASP 45  . Squamous carcinoma ([place]) 2016    "right leg; right jawline"  . History of hiatal hernia   . Arthritis     "back; hands" (01/13/2015)  . Anxiety     Past Surgical History  Procedure Laterality Date  . Laparoscopic  bilateral salpingo oopherectomy Bilateral ~ 2004  . Knee arthroscopy Right 1990's  . Abdominal hysterectomy  ~ 1975  . Cardioversion  01/13/2015    "didn't work"  . Appendectomy  ~ 1975  . Cataract extraction w/ intraocular lens  implant, bilateral Bilateral 2011  . Dilation and curettage of uterus    . Cardiac catheterization  1990's  . Squamous cell carcinoma excision  2016    "right leg; right jawline"  . Cardioversion N/A 01/13/2015    Procedure: CARDIOVERSION;  Surgeon: Josue Hector, MD;  [facility] ENDOSCOPY;  Service: Cardiovascular;  Laterality: N/A;  . Cardioversion N/A 06/21/2015    Procedure: CARDIOVERSION;  Surgeon: Larey Dresser, MD;  Location: [place];  Service: Cardiovascular;  Laterality: N/A;  . Electrophysiologic study N/A 07/17/2015    Procedure: Cardioversion;  Surgeon: Deboraha Sprang, MD;  Location: [place] [facility];  Service: Cardiovascular;  Laterality: N/A;    Current Outpatient Prescriptions  Medication Sig Dispense Refill  . acetaminophen (TYLENOL) 500 MG tablet Take 500 mg by mouth every 8 (eight) hours as needed for mild pain or moderate pain.    . calcium carbonate (TUMS - DOSED IN MG ELEMENTAL CALCIUM) 500 MG chewable tablet Chew 1 tablet by mouth daily as needed for indigestion or heartburn.    . cholecalciferol (VITAMIN D) 1000 UNITS tablet Take 1,000 Units by  mouth daily.    [name] diltiazem (CARDIZEM CD) 180 MG 24 hr capsule Take 1 capsule (180 mg total) by mouth daily.    [name] diltiazem (CARDIZEM) 30 MG tablet Take 1/2 - 1 tablet every 4 hours AS NEEDED for heart rate >100 as long as blood pressure >100 30 tablet 1  . labetalol (NORMODYNE) 100 MG tablet Take 1 tablet (100 mg total) by mouth 2 (two) times daily. 180 tablet 2  . levothyroxine (SYNTHROID, LEVOTHROID) 50 MCG tablet Take 50 mcg by mouth daily.      . Melatonin 3 MG TABS Take 1 tablet by mouth at bedtime.    . Multiple Vitamins-Minerals (VITEYES AREDS FORMULA/LUTEIN) CAPS Take 1 capsule by  mouth 2 (two) times daily.     [name] warfarin (COUMADIN) 2.5 MG tablet Take 2.5 mg by mouth daily at 6 PM.     . [DISCONTINUED] pantoprazole (PROTONIX) 20 MG tablet Take 1 tablet (20 mg total) by mouth daily. 30 tablet 11  . [DISCONTINUED] pravastatin (PRAVACHOL) 80 MG tablet Take 40 mg by mouth Daily.     . [DISCONTINUED] spironolactone (ALDACTONE) 25 MG tablet Take 1/2 tablet by mouth once daily     No current facility-administered medications for this visit.    Allergies  Allergen Reactions  . Amiodarone Nausea And Vomiting    Only at higher dose.  Able to tolerate lower dose  . Dabigatran Other (See Comments)    Unknown, per pt   . Sulfonamide Derivatives     REACTION: hives    Review of Systems negative except from HPI and PMH  PE BP 184/96 mmHg  Pulse 81  Ht 5\' 2"  (1.575 m)  Wt 113 lb 12.8 oz (51.619 kg)  BMI 20.81 kg/m2 Well developed and well nourished in no acute distress HENT normal E scleral and icterus clear Neck Supple JVP flat; carotids brisk and full Clear to ausculation  Irregularly irregular    rhythm, no murmurs gallops or rub Soft with active bowel sounds No clubbing cyanosis n Edema Alert and oriented, grossly normal motor and sensory function Skin Warm and Dry     ECG  was ordered today and demonstrated atrial flutter  with variable conduction. Rate is 81 Intervals-/08/40   Assessment an  Plan  Atrial fibrillatio/flutter-permanent  Atrial tachycardia -recurrent   Sleep issues  Hypertension labile  at this juncture, we will pursue a strategy of rate control using labetalol and diltiazem. I think her heart rates are sufficiently slow that AV junction ablation and pacing are not likely going to be significantly beneficial although the irregularity could be addressed and this might be helpful.    given her blood pressure being elevated, we increase her labetalol again from 100--200 mg twice daily, but given its lability if her a.m. blood pressure  is less than 120 she take only 100 mg  Her sleep is a big issue; I wonder if a sleep aid might be of benefit. Not withstanding her prior issues with Xanax, I have asked her to follow up with her PCP regarding some type of pharmacological assistance.  We spent more than 50% of our >25 min visit in face to face counseling regarding the above

## 2015-11-08 DIAGNOSIS — L720 Epidermal cyst: Secondary | ICD-10-CM | POA: Diagnosis not present

## 2015-11-08 DIAGNOSIS — L821 Other seborrheic keratosis: Secondary | ICD-10-CM | POA: Diagnosis not present

## 2015-11-08 DIAGNOSIS — D225 Melanocytic nevi of trunk: Secondary | ICD-10-CM | POA: Diagnosis not present

## 2015-11-08 DIAGNOSIS — L570 Actinic keratosis: Secondary | ICD-10-CM | POA: Diagnosis not present

## 2015-11-08 DIAGNOSIS — D485 Neoplasm of uncertain behavior of skin: Secondary | ICD-10-CM | POA: Diagnosis not present

## 2015-11-08 DIAGNOSIS — Z85828 Personal history of other malignant neoplasm of skin: Secondary | ICD-10-CM | POA: Diagnosis not present

## 2015-11-08 DIAGNOSIS — C44722 Squamous cell carcinoma of skin of right lower limb, including hip: Secondary | ICD-10-CM | POA: Diagnosis not present

## 2015-11-13 ENCOUNTER — Other Ambulatory Visit: Payer: Self-pay | Admitting: Internal Medicine

## 2015-11-15 ENCOUNTER — Other Ambulatory Visit: Payer: Self-pay | Admitting: Internal Medicine

## 2015-11-15 ENCOUNTER — Other Ambulatory Visit: Payer: Self-pay | Admitting: *Deleted

## 2015-11-15 MED ORDER — LABETALOL HCL 100 MG PO TABS: ORAL_TABLET | ORAL | Status: DC

## 2015-11-16 ENCOUNTER — Ambulatory Visit (INDEPENDENT_AMBULATORY_CARE_PROVIDER_SITE_OTHER): Payer: Medicare Other | Admitting: *Deleted

## 2015-11-16 DIAGNOSIS — Z7901 Long term (current) use of anticoagulants: Secondary | ICD-10-CM

## 2015-11-16 DIAGNOSIS — Z5181 Encounter for therapeutic drug level monitoring: Secondary | ICD-10-CM | POA: Diagnosis not present

## 2015-11-16 DIAGNOSIS — I4819 Other persistent atrial fibrillation: Secondary | ICD-10-CM

## 2015-11-16 DIAGNOSIS — I481 Persistent atrial fibrillation: Secondary | ICD-10-CM

## 2015-11-16 LAB — POCT INR: INR: 1.9

## 2015-11-27 ENCOUNTER — Encounter (HOSPITAL_COMMUNITY): Payer: Self-pay | Admitting: Nurse Practitioner

## 2015-11-27 ENCOUNTER — Ambulatory Visit (HOSPITAL_COMMUNITY)
Admission: RE | Admit: 2015-11-27 | Discharge: 2015-11-27 | Disposition: A | Discharge status: Home / Self Care | Payer: Medicare Other | Source: Ambulatory Visit | Attending: Nurse Practitioner | Admitting: Nurse Practitioner

## 2015-11-27 VITALS — BP 138/70 | HR 79 | Ht 62.0 in | Wt 114.0 lb

## 2015-11-27 DIAGNOSIS — R51 Headache: Secondary | ICD-10-CM | POA: Diagnosis not present

## 2015-11-27 DIAGNOSIS — Z7901 Long term (current) use of anticoagulants: Secondary | ICD-10-CM | POA: Insufficient documentation

## 2015-11-27 DIAGNOSIS — I4891 Unspecified atrial fibrillation: Secondary | ICD-10-CM | POA: Diagnosis not present

## 2015-11-27 DIAGNOSIS — Z79899 Other long term (current) drug therapy: Secondary | ICD-10-CM | POA: Insufficient documentation

## 2015-11-27 DIAGNOSIS — E785 Hyperlipidemia, unspecified: Secondary | ICD-10-CM | POA: Diagnosis not present

## 2015-11-27 DIAGNOSIS — Z8249 Family history of ischemic heart disease and other diseases of the circulatory system: Secondary | ICD-10-CM | POA: Diagnosis not present

## 2015-11-27 DIAGNOSIS — I481 Persistent atrial fibrillation: Secondary | ICD-10-CM

## 2015-11-27 DIAGNOSIS — M25569 Pain in unspecified knee: Secondary | ICD-10-CM | POA: Diagnosis not present

## 2015-11-27 DIAGNOSIS — I10 Essential (primary) hypertension: Secondary | ICD-10-CM | POA: Diagnosis not present

## 2015-11-27 DIAGNOSIS — Z882 Allergy status to sulfonamides status: Secondary | ICD-10-CM | POA: Diagnosis not present

## 2015-11-27 DIAGNOSIS — K219 Gastro-esophageal reflux disease without esophagitis: Secondary | ICD-10-CM | POA: Diagnosis not present

## 2015-11-27 DIAGNOSIS — E039 Hypothyroidism, unspecified: Secondary | ICD-10-CM | POA: Insufficient documentation

## 2015-11-27 DIAGNOSIS — I4819 Other persistent atrial fibrillation: Secondary | ICD-10-CM

## 2015-11-27 DIAGNOSIS — Z888 Allergy status to other drugs, medicaments and biological substances status: Secondary | ICD-10-CM | POA: Insufficient documentation

## 2015-11-27 LAB — COMPREHENSIVE METABOLIC PANEL
ALT: 25 U/L (ref 14–54)
AST: 26 U/L (ref 15–41)
Albumin: 4 g/dL (ref 3.5–5.0)
Alkaline Phosphatase: 92 U/L (ref 38–126)
Anion gap: 9 (ref 5–15)
BILIRUBIN TOTAL: 0.5 mg/dL (ref 0.3–1.2)
BUN: 21 mg/dL — AB (ref 6–20)
CO2: 28 mmol/L (ref 22–32)
Calcium: 9.5 mg/dL (ref 8.9–10.3)
Chloride: 104 mmol/L (ref 101–111)
Creatinine, Ser: 1.02 mg/dL — ABNORMAL HIGH (ref 0.44–1.00)
GFR calc non Af Amer: 48 mL/min — ABNORMAL LOW (ref >60)
GFR, EST AFRICAN AMERICAN: 55 mL/min — AB (ref >60)
GLUCOSE: 104 mg/dL — AB (ref 65–99)
Potassium: 4.6 mmol/L (ref 3.5–5.1)
SODIUM: 141 mmol/L (ref 135–145)
TOTAL PROTEIN: 6.8 g/dL (ref 6.5–8.1)

## 2015-11-27 LAB — T4, FREE: Free T4: 1.02 ng/dL (ref 0.61–1.12)

## 2015-11-27 LAB — TSH: TSH: 1.5 u[IU]/mL (ref 0.350–4.500)

## 2015-11-27 NOTE — Progress Notes (Signed)
Patient ID: [name], female   DOB: 1925/02/08, 82 y.o.   MRN: [id number]     Primary Care Physician: Thressa Sheller, MD Referring Physician: Dr. Purvis Sheffield Lueth is a 82 y.o. female with a h/o persistent afib/flutter tht is currently being rate controlled having failed tikosyn/amiodarone and two cardioversion last fall/winter. She saw Dr. Caryl Comes last end of March and her labetalol was increased for HR and BP. She asked to be seen today for 3 concerns, persistent H/A's for months, shooting pains around her rt knee and feeling fullness in her chest when her heart rate gets up around 100 usually just for a few minutes. Overall, her heart rate is much improved from earlier in year. BP is well managed.She will feel her heart rate usually first thing in am before she takes her meds. She states that her HA's were present at the time she saw Dr. Caryl Comes in March and she forgot to mention, but they have become more frequent, usually several times a week and they are improved with Tylenol. She had negative CT of head 1/17. No falls, no signs of neuro deficients with H/A. She has never had H/A's and she is puzzled why they are occurring.No other change in medications. She is pending appointment with her primary next week. Her knee is slightly tender to touch but exam is unremarkable. She continues on warfarin.  Today, she denies symptoms of palpitations, chest pain, shortness of breath, orthopnea, PND, lower extremity edema, dizziness, presyncope, syncope, or neurologic sequela. The patient is tolerating medications without difficulties and is otherwise without complaint today.   Past Medical History  Diagnosis Date  . Persistent atrial fibrillation ([place])        . First degree AV block   . SVT (supraventricular tachycardia) (HCC)     possible atrial tachycardia, possible junctional tacycardia  . Hypertension   . Hypothyroidism   . Hyperlipidemia   . Gastroesophageal reflux disease   . Seasonal  allergies   . Hx of echocardiogram     echo 07/2009:  mild focal basal septal hypertrophy, EF 55-60%, mild MR, mod LAE, mild RAE, PASP 45  . Squamous carcinoma ([place]) 2016    "right leg; right jawline"  . History of hiatal hernia   . Arthritis     "back; hands" (01/13/2015)  . Anxiety    Past Surgical History  Procedure Laterality Date  . Laparoscopic bilateral salpingo oopherectomy Bilateral ~ 2004  . Knee arthroscopy Right 1990's  . Abdominal hysterectomy  ~ 1975  . Cardioversion  01/13/2015    "didn't work"  . Appendectomy  ~ 1975  . Cataract extraction w/ intraocular lens  implant, bilateral Bilateral 2011  . Dilation and curettage of uterus    . Cardiac catheterization  1990's  . Squamous cell carcinoma excision  2016    "right leg; right jawline"  . Cardioversion N/A 01/13/2015    Procedure: CARDIOVERSION;  Surgeon: Josue Hector, MD;  Location: [facility] ENDOSCOPY;  Service: Cardiovascular;  Laterality: N/A;  . Cardioversion N/A 06/21/2015    Procedure: CARDIOVERSION;  Surgeon: Larey Dresser, MD;  Location: [place];  Service: Cardiovascular;  Laterality: N/A;  . Electrophysiologic study N/A 07/17/2015    Procedure: Cardioversion;  Surgeon: Deboraha Sprang, MD;  Location: [place] CV LAB;  Service: Cardiovascular;  Laterality: N/A;    Current Outpatient Prescriptions  Medication Sig Dispense Refill  . acetaminophen (TYLENOL) 500 MG tablet Take 500 mg by mouth every 8 (eight) hours  as needed for mild pain or moderate pain.    . calcium carbonate (TUMS - DOSED IN MG ELEMENTAL CALCIUM) 500 MG chewable tablet Chew 1 tablet by mouth daily as needed for indigestion or heartburn.    . cholecalciferol (VITAMIN D) 1000 UNITS tablet Take 1,000 Units by mouth daily.    [name] diltiazem (CARDIZEM CD) 180 MG 24 hr capsule Take 180 mg by mouth daily.     [name] diltiazem (CARDIZEM) 30 MG tablet Take 1/2 - 1 tablet every 4 hours AS NEEDED for heart rate >100 as long as blood pressure >100 30 tablet 1   . labetalol (NORMODYNE) 100 MG tablet Take (200 mg) by mouth twice daily, if AM blood pressure is < 120 for the top number, take 100 mg AM only. 360 tablet 2  . levothyroxine (SYNTHROID, LEVOTHROID) 50 MCG tablet Take 50 mcg by mouth daily.      . Melatonin 3 MG TABS Take 1 tablet by mouth at bedtime.    . Multiple Vitamins-Minerals (VITEYES AREDS FORMULA/LUTEIN) CAPS Take 1 capsule by mouth 2 (two) times daily.     [name] warfarin (COUMADIN) 2.5 MG tablet Take 2.5 mg by mouth daily at 6 PM.     . [DISCONTINUED] pantoprazole (PROTONIX) 20 MG tablet Take 1 tablet (20 mg total) by mouth daily. 30 tablet 11  . [DISCONTINUED] pravastatin (PRAVACHOL) 80 MG tablet Take 40 mg by mouth Daily.     . [DISCONTINUED] spironolactone (ALDACTONE) 25 MG tablet Take 1/2 tablet by mouth once daily     No current facility-administered medications for this encounter.    Allergies  Allergen Reactions  . Amiodarone Nausea And Vomiting    Only at higher dose.  Able to tolerate lower dose  . Dabigatran Other (See Comments)    Unknown, per pt   . Sulfonamide Derivatives     REACTION: hives    Social History   Social History  . Marital Status: Widowed    Spouse Name: N/A  . Number of Children: N/A  . Years of Education: N/A   Occupational History  . Not on file.   Social History Main Topics  . Smoking status: Never Smoker   . Smokeless tobacco: Never Used  . Alcohol Use: No  . Drug Use: No  . Sexual Activity: No   Other Topics Concern  . Not on file   Social History Narrative   She has two children, two grandchildren and three great grandchildren    Family History  Problem Relation Age of Onset  . Heart disease Father     ROS- All systems are reviewed and negative except as per the HPI above  Physical Exam: Filed Vitals:   11/27/15 1428  BP: 138/70  Pulse: 79  Height: 5\' 2"  (1.575 m)  Weight: 114 lb (51.71 kg)    GEN- The patient is well appearing, alert and oriented x 3 today.     Head- normocephalic, atraumatic Eyes-  Sclera clear, conjunctiva pink Ears- hearing intact Oropharynx- clear Neck- supple, no JVP Lymph- no cervical lymphadenopathy Lungs- Clear to ausculation bilaterally, normal work of breathing Heart- Regular rate and rhythm, no murmurs, rubs or gallops, PMI not laterally displaced GI- soft, NT, ND, + BS Extremities- no clubbing, cyanosis, or edema MS- no significant deformity or atrophy Skin- no rash or lesion Psych- euthymic mood, full affect Neuro- strength and sensation are intact  EKG-S Brady at 58 bpm  Assessment and Plan: 1. afib It has been decided with Dr.  Caryl Comes and pt that she will be rate controlled and overall that is much better than previous issues with RVR. Continue Cardizem and labetalol. Continue warfarin She notices some increase in HR up to 100 at which time she will notice some chest fullness but is nonsustained  Chest fullness is not exertional.  2.H/A's Unclear to etiology Cmet,cbc,tsh panel today and will fax to PCP Neg CT of head 1/17 No falls or associated neuro deficients Referred to PCP for further evaluation  3. Knee pain Appears to be musculoskeletal Reassured    Geroge Baseman. Twylah Bennetts, [facility] [address] [place], [place] [address] [phone]

## 2015-11-28 LAB — T3, FREE: T3, Free: 2 pg/mL (ref 2.0–4.4)

## 2015-12-05 ENCOUNTER — Ambulatory Visit (HOSPITAL_COMMUNITY): Payer: Medicare Other | Admitting: Nurse Practitioner

## 2015-12-05 ENCOUNTER — Ambulatory Visit (INDEPENDENT_AMBULATORY_CARE_PROVIDER_SITE_OTHER): Payer: Medicare Other | Admitting: *Deleted

## 2015-12-05 DIAGNOSIS — I4819 Other persistent atrial fibrillation: Secondary | ICD-10-CM

## 2015-12-05 DIAGNOSIS — Z7901 Long term (current) use of anticoagulants: Secondary | ICD-10-CM | POA: Diagnosis not present

## 2015-12-05 DIAGNOSIS — I481 Persistent atrial fibrillation: Secondary | ICD-10-CM

## 2015-12-05 DIAGNOSIS — Z5181 Encounter for therapeutic drug level monitoring: Secondary | ICD-10-CM

## 2015-12-05 LAB — POCT INR: INR: 1.6

## 2015-12-07 ENCOUNTER — Inpatient Hospital Stay (HOSPITAL_COMMUNITY): Admission: RE | Admit: 2015-12-07 | Payer: Medicare Other | Source: Ambulatory Visit | Admitting: Nurse Practitioner

## 2015-12-07 DIAGNOSIS — I129 Hypertensive chronic kidney disease with stage 1 through stage 4 chronic kidney disease, or unspecified chronic kidney disease: Secondary | ICD-10-CM | POA: Diagnosis not present

## 2015-12-07 DIAGNOSIS — H6120 Impacted cerumen, unspecified ear: Secondary | ICD-10-CM | POA: Diagnosis not present

## 2015-12-07 DIAGNOSIS — E039 Hypothyroidism, unspecified: Secondary | ICD-10-CM | POA: Diagnosis not present

## 2015-12-07 DIAGNOSIS — I4891 Unspecified atrial fibrillation: Secondary | ICD-10-CM | POA: Diagnosis not present

## 2015-12-07 DIAGNOSIS — E785 Hyperlipidemia, unspecified: Secondary | ICD-10-CM | POA: Diagnosis not present

## 2015-12-19 ENCOUNTER — Telehealth (HOSPITAL_COMMUNITY): Payer: Self-pay | Admitting: *Deleted

## 2015-12-19 DIAGNOSIS — I4891 Unspecified atrial fibrillation: Secondary | ICD-10-CM | POA: Diagnosis not present

## 2015-12-19 DIAGNOSIS — Z7901 Long term (current) use of anticoagulants: Secondary | ICD-10-CM | POA: Diagnosis not present

## 2015-12-19 NOTE — Telephone Encounter (Signed)
Patient called in stating she would like to go back down to 100mg  twice a day of labetalol. She feels extremely tired and just generally bad when taking the 200mg  BID.  Her blood pressures have been running 120-130/80s consistently.  Discussed with Roderic Palau NP - will decrease back down to 100mg  twice a day and patient will call in 1 week with blood pressure/heart rate log. Patient was in agreement with this plan.

## 2016-01-02 DIAGNOSIS — Z7901 Long term (current) use of anticoagulants: Secondary | ICD-10-CM | POA: Diagnosis not present

## 2016-01-02 DIAGNOSIS — I4891 Unspecified atrial fibrillation: Secondary | ICD-10-CM | POA: Diagnosis not present

## 2016-01-09 DIAGNOSIS — R300 Dysuria: Secondary | ICD-10-CM | POA: Diagnosis not present

## 2016-01-09 DIAGNOSIS — N390 Urinary tract infection, site not specified: Secondary | ICD-10-CM | POA: Diagnosis not present

## 2016-01-18 DIAGNOSIS — M810 Age-related osteoporosis without current pathological fracture: Secondary | ICD-10-CM | POA: Diagnosis not present

## 2016-01-22 DIAGNOSIS — I4891 Unspecified atrial fibrillation: Secondary | ICD-10-CM | POA: Diagnosis not present

## 2016-01-22 DIAGNOSIS — Z7901 Long term (current) use of anticoagulants: Secondary | ICD-10-CM | POA: Diagnosis not present

## 2016-01-24 DIAGNOSIS — C44722 Squamous cell carcinoma of skin of right lower limb, including hip: Secondary | ICD-10-CM | POA: Diagnosis not present

## 2016-02-22 DIAGNOSIS — I4891 Unspecified atrial fibrillation: Secondary | ICD-10-CM | POA: Diagnosis not present

## 2016-02-22 DIAGNOSIS — Z7901 Long term (current) use of anticoagulants: Secondary | ICD-10-CM | POA: Diagnosis not present

## 2016-03-21 DIAGNOSIS — I4891 Unspecified atrial fibrillation: Secondary | ICD-10-CM | POA: Diagnosis not present

## 2016-03-21 DIAGNOSIS — Z7901 Long term (current) use of anticoagulants: Secondary | ICD-10-CM | POA: Diagnosis not present

## 2016-03-28 DIAGNOSIS — H353110 Nonexudative age-related macular degeneration, right eye, stage unspecified: Secondary | ICD-10-CM | POA: Diagnosis not present

## 2016-03-28 DIAGNOSIS — H04123 Dry eye syndrome of bilateral lacrimal glands: Secondary | ICD-10-CM | POA: Diagnosis not present

## 2016-03-28 DIAGNOSIS — H40013 Open angle with borderline findings, low risk, bilateral: Secondary | ICD-10-CM | POA: Diagnosis not present

## 2016-03-28 DIAGNOSIS — H353120 Nonexudative age-related macular degeneration, left eye, stage unspecified: Secondary | ICD-10-CM | POA: Diagnosis not present

## 2016-04-05 NOTE — Telephone Encounter (Signed)
close Encounter

## 2016-04-17 DIAGNOSIS — L853 Xerosis cutis: Secondary | ICD-10-CM | POA: Diagnosis not present

## 2016-04-17 DIAGNOSIS — Z23 Encounter for immunization: Secondary | ICD-10-CM | POA: Diagnosis not present

## 2016-04-17 DIAGNOSIS — Z85828 Personal history of other malignant neoplasm of skin: Secondary | ICD-10-CM | POA: Diagnosis not present

## 2016-04-17 DIAGNOSIS — L570 Actinic keratosis: Secondary | ICD-10-CM | POA: Diagnosis not present

## 2016-04-17 DIAGNOSIS — L814 Other melanin hyperpigmentation: Secondary | ICD-10-CM | POA: Diagnosis not present

## 2016-04-17 DIAGNOSIS — D225 Melanocytic nevi of trunk: Secondary | ICD-10-CM | POA: Diagnosis not present

## 2016-04-17 DIAGNOSIS — D485 Neoplasm of uncertain behavior of skin: Secondary | ICD-10-CM | POA: Diagnosis not present

## 2016-04-17 DIAGNOSIS — L821 Other seborrheic keratosis: Secondary | ICD-10-CM | POA: Diagnosis not present

## 2016-04-17 DIAGNOSIS — D1801 Hemangioma of skin and subcutaneous tissue: Secondary | ICD-10-CM | POA: Diagnosis not present

## 2016-04-18 DIAGNOSIS — C44729 Squamous cell carcinoma of skin of left lower limb, including hip: Secondary | ICD-10-CM | POA: Diagnosis not present

## 2016-04-30 DIAGNOSIS — Z7901 Long term (current) use of anticoagulants: Secondary | ICD-10-CM | POA: Diagnosis not present

## 2016-04-30 DIAGNOSIS — I4891 Unspecified atrial fibrillation: Secondary | ICD-10-CM | POA: Diagnosis not present

## 2016-05-01 DIAGNOSIS — C44729 Squamous cell carcinoma of skin of left lower limb, including hip: Secondary | ICD-10-CM | POA: Diagnosis not present

## 2016-05-20 DIAGNOSIS — N390 Urinary tract infection, site not specified: Secondary | ICD-10-CM | POA: Diagnosis not present

## 2016-05-20 DIAGNOSIS — E785 Hyperlipidemia, unspecified: Secondary | ICD-10-CM | POA: Diagnosis not present

## 2016-05-20 DIAGNOSIS — I129 Hypertensive chronic kidney disease with stage 1 through stage 4 chronic kidney disease, or unspecified chronic kidney disease: Secondary | ICD-10-CM | POA: Diagnosis not present

## 2016-05-20 DIAGNOSIS — E039 Hypothyroidism, unspecified: Secondary | ICD-10-CM | POA: Diagnosis not present

## 2016-05-20 DIAGNOSIS — M810 Age-related osteoporosis without current pathological fracture: Secondary | ICD-10-CM | POA: Diagnosis not present

## 2016-05-21 ENCOUNTER — Ambulatory Visit (HOSPITAL_COMMUNITY)
Admission: RE | Admit: 2016-05-21 | Discharge: 2016-05-21 | Disposition: A | Discharge status: Home / Self Care | Payer: Medicare Other | Source: Ambulatory Visit | Attending: Nurse Practitioner | Admitting: Nurse Practitioner

## 2016-05-21 ENCOUNTER — Ambulatory Visit (INDEPENDENT_AMBULATORY_CARE_PROVIDER_SITE_OTHER): Payer: Medicare Other | Admitting: *Deleted

## 2016-05-21 ENCOUNTER — Encounter (HOSPITAL_COMMUNITY): Payer: Self-pay | Admitting: Nurse Practitioner

## 2016-05-21 VITALS — BP 160/84 | HR 78 | Ht 62.0 in | Wt 117.2 lb

## 2016-05-21 DIAGNOSIS — R9431 Abnormal electrocardiogram [ECG] [EKG]: Secondary | ICD-10-CM | POA: Insufficient documentation

## 2016-05-21 DIAGNOSIS — Z5181 Encounter for therapeutic drug level monitoring: Secondary | ICD-10-CM | POA: Diagnosis not present

## 2016-05-21 DIAGNOSIS — I4819 Other persistent atrial fibrillation: Secondary | ICD-10-CM

## 2016-05-21 DIAGNOSIS — Z79899 Other long term (current) drug therapy: Secondary | ICD-10-CM | POA: Insufficient documentation

## 2016-05-21 DIAGNOSIS — Z7901 Long term (current) use of anticoagulants: Secondary | ICD-10-CM | POA: Diagnosis not present

## 2016-05-21 DIAGNOSIS — I482 Chronic atrial fibrillation: Secondary | ICD-10-CM | POA: Insufficient documentation

## 2016-05-21 DIAGNOSIS — I481 Persistent atrial fibrillation: Secondary | ICD-10-CM | POA: Diagnosis not present

## 2016-05-21 DIAGNOSIS — I10 Essential (primary) hypertension: Secondary | ICD-10-CM | POA: Insufficient documentation

## 2016-05-21 DIAGNOSIS — Z90710 Acquired absence of both cervix and uterus: Secondary | ICD-10-CM | POA: Insufficient documentation

## 2016-05-21 DIAGNOSIS — I4892 Unspecified atrial flutter: Secondary | ICD-10-CM | POA: Diagnosis not present

## 2016-05-21 DIAGNOSIS — I471 Supraventricular tachycardia: Secondary | ICD-10-CM | POA: Diagnosis not present

## 2016-05-21 DIAGNOSIS — Z9889 Other specified postprocedural states: Secondary | ICD-10-CM | POA: Diagnosis not present

## 2016-05-21 DIAGNOSIS — I4821 Permanent atrial fibrillation: Secondary | ICD-10-CM

## 2016-05-21 LAB — POCT INR: INR: 1.2

## 2016-05-21 NOTE — Progress Notes (Signed)
Patient ID: [name], female   DOB: [birth year], 82 y.o.   MRN: [id number]     Primary Care Physician: Thressa Sheller, MD Referring Physician: Dr. Purvis Sheffield Willert is a 82 y.o. female with a h/o persistent afib/flutter tht is currently being rate controlled having failed tikosyn/amiodarone and two cardioversion last fall/winter. She saw Dr. Caryl Comes last end of March and her labetalol was increased for HR and BP. She asked to be seen today for 3 concerns, persistent H/A's for months, shooting pains around her rt knee and feeling fullness in her chest when her heart rate gets up around 100 usually just for a few minutes. Overall, her heart rate is much improved from earlier in year. BP is well managed.She will feel her heart rate usually first thing in am before she takes her meds. She states that her HA's were present at the time she saw Dr. Caryl Comes in March and she forgot to mention, but they have become more frequent, usually several times a week and they are improved with Tylenol. She had negative CT of head 1/17. No falls, no signs of neuro deficients with H/A. She has never had H/A's and she is puzzled why they are occurring.No other change in medications. She is pending appointment with her primary next week. Her knee is slightly tender to touch but exam is unremarkable. She continues on warfarin.  Returns to [place] clinic 10/24, she is living in persistent afib/flutter since last year failing tikosyn/amiodarone/cardioversions. She feels that she is doing well. HR is controlled and BP at home is controlled. BP is office today is elevated. She is doing her usual activities without symptoms. Continues on warfarin but discovered last week, that coumadin  had been left out of her pre poured meds. She was seen in the coumadin clinic this am and with a INR of 1.2 and her dose was adjusted with recheck in one week.She will rarely take an extra 1/2 tab of short acting cardizem.  Today, she denies  symptoms of palpitations, chest pain, shortness of breath, orthopnea, PND, lower extremity edema, dizziness, presyncope, syncope, or neurologic sequela. The patient is tolerating medications without difficulties and is otherwise without complaint today.   Past Medical History:  Diagnosis Date  . Anxiety   . Arthritis    "back; hands" (01/13/2015)  . First degree AV block   . Gastroesophageal reflux disease   . History of hiatal hernia   . Hx of echocardiogram    echo 07/2009:  mild focal basal septal hypertrophy, EF 55-60%, mild MR, mod LAE, mild RAE, PASP 45  . Hyperlipidemia   . Hypertension   . Hypothyroidism   . Persistent atrial fibrillation ([place])       . Seasonal allergies   . Squamous carcinoma 2016   "right leg; right jawline"  . SVT (supraventricular tachycardia) (HCC)    possible atrial tachycardia, possible junctional tacycardia   Past Surgical History:  Procedure Laterality Date  . ABDOMINAL HYSTERECTOMY  ~ 1975  . APPENDECTOMY  ~ 1975  . CARDIAC CATHETERIZATION  1990's  . CARDIOVERSION  01/13/2015   "didn't work"  . CARDIOVERSION N/A 01/13/2015   Procedure: CARDIOVERSION;  Surgeon: Josue Hector, MD;  Location: [facility] ENDOSCOPY;  Service: Cardiovascular;  Laterality: N/A;  . CARDIOVERSION N/A 06/21/2015   Procedure: CARDIOVERSION;  Surgeon: Larey Dresser, MD;  Location: [place];  Service: Cardiovascular;  Laterality: N/A;  . CATARACT EXTRACTION W/ INTRAOCULAR LENS  IMPLANT, BILATERAL Bilateral 2011  .  DILATION AND CURETTAGE OF UTERUS    . ELECTROPHYSIOLOGIC STUDY N/A 07/17/2015   Procedure: Cardioversion;  Surgeon: Deboraha Sprang, MD;  Location: [place] CV LAB;  Service: Cardiovascular;  Laterality: N/A;  . KNEE ARTHROSCOPY Right 1990's  . LAPAROSCOPIC BILATERAL SALPINGO OOPHERECTOMY Bilateral ~ 2004  . SQUAMOUS CELL CARCINOMA EXCISION  2016   "right leg; right jawline"    Current Outpatient Prescriptions  Medication Sig Dispense Refill  . acetaminophen  (TYLENOL) 500 MG tablet Take 500 mg by mouth every 8 (eight) hours as needed for mild pain or moderate pain.    . calcium carbonate (TUMS - DOSED IN MG ELEMENTAL CALCIUM) 500 MG chewable tablet Chew 1 tablet by mouth daily as needed for indigestion or heartburn.    . cholecalciferol (VITAMIN D) 1000 UNITS tablet Take 1,000 Units by mouth daily.    [name] diltiazem (CARDIZEM CD) 180 MG 24 hr capsule Take 180 mg by mouth daily.     [name] diltiazem (CARDIZEM) 30 MG tablet Take 1/2 - 1 tablet every 4 hours AS NEEDED for heart rate >100 as long as blood pressure >100 30 tablet 1  . labetalol (NORMODYNE) 100 MG tablet Take 100 mg by mouth 2 (two) times daily.    [name] levothyroxine (SYNTHROID, LEVOTHROID) 50 MCG tablet Take 50 mcg by mouth daily.      . Melatonin 3 MG TABS Take 1 tablet by mouth at bedtime.    . Multiple Vitamins-Minerals (VITEYES AREDS FORMULA/LUTEIN) CAPS Take 1 capsule by mouth 2 (two) times daily.     [name] warfarin (COUMADIN) 2.5 MG tablet Take 2.5 mg by mouth daily at 6 PM.      No current facility-administered medications for this encounter.     Allergies  Allergen Reactions  . Amiodarone Nausea And Vomiting    Only at higher dose.  Able to tolerate lower dose  . Dabigatran Other (See Comments)    Unknown, per pt   . Sulfonamide Derivatives     REACTION: hives    Social History   Social History  . Marital status: Widowed    Spouse name: N/A  . Number of children: N/A  . Years of education: N/A   Occupational History  . Not on file.   Social History Main Topics  . Smoking status: Never Smoker  . Smokeless tobacco: Never Used  . Alcohol use No  . Drug use: No  . Sexual activity: No   Other Topics Concern  . Not on file   Social History Narrative   She has two children, two grandchildren and three great grandchildren    Family History  Problem Relation Age of Onset  . Heart disease Father     ROS- All systems are reviewed and negative except as per the HPI  above  Physical Exam: Vitals:   05/21/16 1058  BP: (!) 172/84  Pulse: 78  Weight: 117 lb 3.2 oz (53.2 kg)  Height: 5\' 2"  (1.575 m)    GEN- The patient is well appearing, alert and oriented x 3 today.   Head- normocephalic, atraumatic Eyes-  Sclera clear, conjunctiva pink Ears- hearing intact Oropharynx- clear Neck- supple, no JVP Lymph- no cervical lymphadenopathy Lungs- Clear to ausculation bilaterally, normal work of breathing Heart-irregular rate and rhythm, no murmurs, rubs or gallops, PMI not laterally displaced GI- soft, NT, ND, + BS Extremities- no clubbing, cyanosis, or edema MS- no significant deformity or atrophy Skin- no rash or lesion Psych- euthymic mood, full affect Neuro- strength  and sensation are intact  EKG-atrial flutter with variable rate at 78 bpm, qrs int 78 ms, qtc 453 ms Epic records reviewed  Assessment and Plan: 1. Longstanding permanent afib/flutter Continue Cardizem and labetalol. Continue warfarin She notices some increase in HR up to 100 at which time she will notice some chest fullness but is nonsustained, takes prn cardizem for this situation Chest fullness is not exertional and not new   2. HTN Bp is elevated today but is usually well controlled at home   F/u in afib clinic in 6 months      Butch Penny C. Atiana Levier, [facility] [address] [place], [place] [address] [phone]

## 2016-05-29 ENCOUNTER — Other Ambulatory Visit: Payer: Self-pay | Admitting: Internal Medicine

## 2016-06-03 DIAGNOSIS — I4891 Unspecified atrial fibrillation: Secondary | ICD-10-CM | POA: Diagnosis not present

## 2016-06-03 DIAGNOSIS — Z7901 Long term (current) use of anticoagulants: Secondary | ICD-10-CM | POA: Diagnosis not present

## 2016-06-05 DIAGNOSIS — Z23 Encounter for immunization: Secondary | ICD-10-CM | POA: Diagnosis not present

## 2016-06-05 DIAGNOSIS — M810 Age-related osteoporosis without current pathological fracture: Secondary | ICD-10-CM | POA: Diagnosis not present

## 2016-06-05 DIAGNOSIS — I10 Essential (primary) hypertension: Secondary | ICD-10-CM | POA: Diagnosis not present

## 2016-06-05 DIAGNOSIS — E039 Hypothyroidism, unspecified: Secondary | ICD-10-CM | POA: Diagnosis not present

## 2016-06-26 DIAGNOSIS — C44729 Squamous cell carcinoma of skin of left lower limb, including hip: Secondary | ICD-10-CM | POA: Diagnosis not present

## 2016-07-03 DIAGNOSIS — C44729 Squamous cell carcinoma of skin of left lower limb, including hip: Secondary | ICD-10-CM | POA: Diagnosis not present

## 2016-07-04 ENCOUNTER — Telehealth (HOSPITAL_COMMUNITY): Payer: Self-pay | Admitting: Nurse Practitioner

## 2016-07-04 NOTE — Telephone Encounter (Signed)
Pt states that she had recent surgery on lower leg for invasive carcinoma that was not completely removed with the surgery. She had a "chemotherapy shot" yesterday and has noted today elevation of BP at 147/105 and chest discomfort. She took an extra 30 mg of cardizem and BP is now lower and chest discomfort is better but still present. She was advised to call the office of the MD that gave this injection as I do not know what the injection was or possible side effects for further guidance. If chest pain escalates, go to to the ER.

## 2016-07-09 DIAGNOSIS — F430 Acute stress reaction: Secondary | ICD-10-CM | POA: Diagnosis not present

## 2016-07-09 DIAGNOSIS — Z7901 Long term (current) use of anticoagulants: Secondary | ICD-10-CM | POA: Diagnosis not present

## 2016-07-09 DIAGNOSIS — I4891 Unspecified atrial fibrillation: Secondary | ICD-10-CM | POA: Diagnosis not present

## 2016-07-10 DIAGNOSIS — C44729 Squamous cell carcinoma of skin of left lower limb, including hip: Secondary | ICD-10-CM | POA: Diagnosis not present

## 2016-07-16 ENCOUNTER — Emergency Department (HOSPITAL_COMMUNITY)
Admission: EM | Admit: 2016-07-16 | Discharge: 2016-07-16 | Disposition: A | Discharge status: Home / Self Care | Payer: Medicare Other | Attending: Emergency Medicine | Admitting: Emergency Medicine

## 2016-07-16 ENCOUNTER — Encounter (HOSPITAL_COMMUNITY): Payer: Self-pay | Admitting: Emergency Medicine

## 2016-07-16 DIAGNOSIS — W268XXA Contact with other sharp object(s), not elsewhere classified, initial encounter: Secondary | ICD-10-CM | POA: Insufficient documentation

## 2016-07-16 DIAGNOSIS — I10 Essential (primary) hypertension: Secondary | ICD-10-CM | POA: Insufficient documentation

## 2016-07-16 DIAGNOSIS — Z79899 Other long term (current) drug therapy: Secondary | ICD-10-CM | POA: Diagnosis not present

## 2016-07-16 DIAGNOSIS — Z7901 Long term (current) use of anticoagulants: Secondary | ICD-10-CM | POA: Insufficient documentation

## 2016-07-16 DIAGNOSIS — Y999 Unspecified external cause status: Secondary | ICD-10-CM | POA: Insufficient documentation

## 2016-07-16 DIAGNOSIS — E039 Hypothyroidism, unspecified: Secondary | ICD-10-CM | POA: Insufficient documentation

## 2016-07-16 DIAGNOSIS — Y929 Unspecified place or not applicable: Secondary | ICD-10-CM | POA: Insufficient documentation

## 2016-07-16 DIAGNOSIS — Z23 Encounter for immunization: Secondary | ICD-10-CM | POA: Insufficient documentation

## 2016-07-16 DIAGNOSIS — Y939 Activity, unspecified: Secondary | ICD-10-CM | POA: Insufficient documentation

## 2016-07-16 DIAGNOSIS — S61211A Laceration without foreign body of left index finger without damage to nail, initial encounter: Secondary | ICD-10-CM | POA: Diagnosis not present

## 2016-07-16 HISTORY — DX: Squamous cell carcinoma of skin, unspecified: C44.92

## 2016-07-16 MED ORDER — LIDOCAINE-EPINEPHRINE-TETRACAINE (LET) SOLUTION
3.0000 mL | Freq: Once | NASAL | Status: DC
  Filled 2016-07-16: qty 3

## 2016-07-16 MED ORDER — LIDOCAINE-EPINEPHRINE (PF) 2 %-1:200000 IJ SOLN
20.0000 mL | Freq: Once | INTRAMUSCULAR | Status: DC
  Filled 2016-07-16: qty 20

## 2016-07-16 MED ORDER — LIDOCAINE HCL 2 % IJ SOLN
10.0000 mL | Freq: Once | INTRAMUSCULAR | Status: AC
  Administered 2016-07-16: 200 mg
  Filled 2016-07-16: qty 20

## 2016-07-16 MED ORDER — TETANUS-DIPHTH-ACELL PERTUSSIS 5-2.5-18.5 LF-MCG/0.5 IM SUSP
0.5000 mL | Freq: Once | INTRAMUSCULAR | Status: AC
  Administered 2016-07-16: 0.5 mL via INTRAMUSCULAR
  Filled 2016-07-16: qty 0.5

## 2016-07-16 NOTE — ED Provider Notes (Signed)
[facility] DEPT Provider Note   CSN: [id number] Arrival date & time: 07/16/16  0357     History   Chief Complaint Chief Complaint  Patient presents with  . Extremity Laceration    HPI [name] is a 82 y.o. female.  She cut her left second finger while opening a can of green beans. She does not own a last tetanus immunization was. She is anticoagulated on warfarin and INR last week was 2.4. She has been unable to control bleeding at home.   The history is provided by the patient.    Past Medical History:  Diagnosis Date  . Anxiety   . Arthritis    "back; hands" (01/13/2015)  . First degree AV block   . Gastroesophageal reflux disease   . History of hiatal hernia   . Hx of echocardiogram    echo 07/2009:  mild focal basal septal hypertrophy, EF 55-60%, mild MR, mod LAE, mild RAE, PASP 45  . Hyperlipidemia   . Hypertension   . Hypothyroidism   . Persistent atrial fibrillation ([place])       . Seasonal allergies   . Squamous carcinoma 2016   "right leg; right jawline"  . Squamous cell skin cancer   . SVT (supraventricular tachycardia) (H[facility])    possible atrial tachycardia, possible junctional tacycardia    Patient Active Problem List   Diagnosis Date Noted  . Constipation 08/31/2015  . Atrial tachycardia ([place]) 01/13/2015  . Anticoagulated on Coumadin 11/20/2012  . Chest pain 02/10/2012  . Hyperlipidemia 05/28/2010  . HYPERTENSION, BENIGN 10/31/2009  . Atrial fibrillation ([place]) 10/31/2009  . SVT/ PSVT/ PAT 09/04/2009    Past Surgical History:  Procedure Laterality Date  . ABDOMINAL HYSTERECTOMY  ~ 1975  . APPENDECTOMY  ~ 1975  . CARDIAC CATHETERIZATION  1990's  . CARDIOVERSION  01/13/2015   "didn't work"  . CARDIOVERSION N/A 01/13/2015   Procedure: CARDIOVERSION;  Surgeon: Josue Hector, MD;  Location: [facility] ENDOSCOPY;  Service: Cardiovascular;  Laterality: N/A;  . CARDIOVERSION N/A 06/21/2015   Procedure: CARDIOVERSION;  Surgeon: Larey Dresser, MD;   Location: [place];  Service: Cardiovascular;  Laterality: N/A;  . CATARACT EXTRACTION W/ INTRAOCULAR LENS  IMPLANT, BILATERAL Bilateral 2011  . DILATION AND CURETTAGE OF UTERUS    . ELECTROPHYSIOLOGIC STUDY N/A 07/17/2015   Procedure: Cardioversion;  Surgeon: Deboraha Sprang, MD;  Location: [place] CV LAB;  Service: Cardiovascular;  Laterality: N/A;  . KNEE ARTHROSCOPY Right 1990's  . LAPAROSCOPIC BILATERAL SALPINGO OOPHERECTOMY Bilateral ~ 2004  . SQUAMOUS CELL CARCINOMA EXCISION  2016   "right leg; right jawline"    OB History    No data available       Home Medications    Prior to Admission medications   Medication Sig Start Date End Date Taking? Authorizing Provider  acetaminophen (TYLENOL) 500 MG tablet Take 500 mg by mouth every 8 (eight) hours as needed for mild pain or moderate pain.    Historical Provider, MD  calcium carbonate (TUMS - DOSED IN MG ELEMENTAL CALCIUM) 500 MG chewable tablet Chew 1 tablet by mouth daily as needed for indigestion or heartburn.    Historical Provider, MD  cholecalciferol (VITAMIN D) 1000 UNITS tablet Take 1,000 Units by mouth daily.    Historical Provider, MD  diltiazem (CARDIZEM CD) 180 MG 24 hr capsule Take 180 mg by mouth daily.  08/04/15   Deboraha Sprang, MD  diltiazem (CARDIZEM CD) 180 MG 24 hr capsule TAKE 1 CAPSULE (  180 MG TOTAL) BY MOUTH DAILY. 05/29/16   Deboraha Sprang, MD  diltiazem (CARDIZEM) 30 MG tablet Take 1/2 - 1 tablet every 4 hours AS NEEDED for heart rate >100 as long as blood pressure >100 04/11/15   Sherran Needs, NP  labetalol (NORMODYNE) 100 MG tablet Take 100 mg by mouth 2 (two) times daily.    Historical Provider, MD  levothyroxine (SYNTHROID, LEVOTHROID) 50 MCG tablet Take 50 mcg by mouth daily.      Historical Provider, MD  Melatonin 3 MG TABS Take 1 tablet by mouth at bedtime.    Historical Provider, MD  Multiple Vitamins-Minerals (VITEYES AREDS FORMULA/LUTEIN) CAPS Take 1 capsule by mouth 2 (two) times daily.      Historical Provider, MD  warfarin (COUMADIN) 2.5 MG tablet Take 2.5 mg by mouth daily at 6 PM.     Historical Provider, MD    Family History Family History  Problem Relation Age of Onset  . Heart disease Father     Social History Social History  Substance Use Topics  . Smoking status: Never Smoker  . Smokeless tobacco: Never Used  . Alcohol use No     Allergies   Amiodarone; Dabigatran; and Sulfonamide derivatives   Review of Systems Review of Systems  All other systems reviewed and are negative.   Physical Exam Updated Vital Signs BP 184/86 (BP Location: Right Arm)   Pulse 97   Temp 98.1 F (36.7 C) (Oral)   Resp 18   SpO2 98%   Physical Exam  Nursing note and vitals reviewed.  82 year old female, resting comfortably and in no acute distress. Vital signs are Significant for hypertension. Oxygen saturation is 98%, which is normal. Head is normocephalic and atraumatic. PERRLA, EOMI. Oropharynx is clear. Neck is nontender and supple without adenopathy or JVD. Back is nontender and there is no CVA tenderness. Lungs are clear without rales, wheezes, or rhonchi. Chest is nontender. Heart has regular rate and rhythm without murmur. Abdomen is soft, flat, nontender without masses or hepatosplenomegaly and peristalsis is normoactive. Extremities have no cyanosis or edema, full range of motion is present. 1.5 cm laceration is present on the flexor surface of the left second finger middle phalanx. Neurovascular and tendon function is intact. Skin is warm and dry without rash. Neurologic: Mental status is normal, cranial nerves are intact, there are no motor or sensory deficits  ED Treatments / Results   Procedures Procedures (including critical care time) LACERATION REPAIR Performed by: [id number] Authorized by: [id number] Consent: Verbal consent obtained. Risks and benefits: risks, benefits and alternatives were discussed Consent given by: patient Patient  identity confirmed: provided demographic data Prepped and Draped in normal sterile fashion Wound explored  Laceration Location: Left second finger  Laceration Length: 1.5cm  No Foreign Bodies seen or palpated  Anesthesia: local infiltration  Local anesthetic: lidocaine 2% without epinephrine  Anesthetic total: 3 ml  Amount of cleaning: standard  Skin closure: Close   Number of sutures: 3  Technique: Simple interrupted sutures with 4-0 Prolene   Patient tolerance: Patient tolerated the procedure well with no immediate complications.   Medications Ordered in ED Medications  lidocaine-EPINEPHrine-tetracaine (LET) solution (not administered)  Tdap (BOOSTRIX) injection 0.5 mL (0.5 mLs Intramuscular Given 07/16/16 0625)  lidocaine (XYLOCAINE) 2 % (with pres) injection 200 mg (200 mg Infiltration Given 07/16/16 [id number])   Initial Impression / Assessment and Plan / ED Course  I have reviewed the triage vital signs and the nursing notes.  Pertinent labs & imaging results that were available during my care of the patient were reviewed by me and considered in my medical decision making (see chart for details).  Clinical Course    Laceration of left second finger. Tdap booster is given. Laceration is closed with sutures with adequate hemostasis. She is discharged with instructions to have sutures removed in 7-10 days.  Final Clinical Impressions(s) / ED Diagnoses   Final diagnoses:  Laceration of left index finger without foreign body without damage to nail, initial encounter    New Prescriptions Discharge Medication List as of 07/16/2016  6:08 AM       Delora Fuel, MD [id number]

## 2016-07-16 NOTE — ED Triage Notes (Addendum)
Pt was opening can of green beans and cut index finger; laceration is about an inch in length and extends up the inside of pt's finger; pt's pharmacy recommended only taking half of her coumadin dose last night; bleeding was somewhat controlled when pt went to sleep; upon waking, bleeding increased and was difficult to control; at present, dressing has been applied to finger, however bleeding is not controlled  Hx of A Fib and Hypertension; current BP is 188/104

## 2016-07-16 NOTE — Discharge Instructions (Signed)
Stitches need to be removed in 7-10 days. That can be done at your doctor's office, at an urgent care center, or in the ED.

## 2016-08-02 DIAGNOSIS — C44729 Squamous cell carcinoma of skin of left lower limb, including hip: Secondary | ICD-10-CM | POA: Diagnosis not present

## 2016-08-20 DIAGNOSIS — I4891 Unspecified atrial fibrillation: Secondary | ICD-10-CM | POA: Diagnosis not present

## 2016-08-20 DIAGNOSIS — Z7901 Long term (current) use of anticoagulants: Secondary | ICD-10-CM | POA: Diagnosis not present

## 2016-08-21 DIAGNOSIS — C44729 Squamous cell carcinoma of skin of left lower limb, including hip: Secondary | ICD-10-CM | POA: Diagnosis not present

## 2016-08-21 DIAGNOSIS — L570 Actinic keratosis: Secondary | ICD-10-CM | POA: Diagnosis not present

## 2016-09-19 DIAGNOSIS — N390 Urinary tract infection, site not specified: Secondary | ICD-10-CM | POA: Diagnosis not present

## 2016-09-19 DIAGNOSIS — R300 Dysuria: Secondary | ICD-10-CM | POA: Diagnosis not present

## 2016-09-24 DIAGNOSIS — I4891 Unspecified atrial fibrillation: Secondary | ICD-10-CM | POA: Diagnosis not present

## 2016-09-24 DIAGNOSIS — Z7901 Long term (current) use of anticoagulants: Secondary | ICD-10-CM | POA: Diagnosis not present

## 2016-09-26 DIAGNOSIS — N390 Urinary tract infection, site not specified: Secondary | ICD-10-CM | POA: Diagnosis not present

## 2016-10-03 DIAGNOSIS — Z7901 Long term (current) use of anticoagulants: Secondary | ICD-10-CM | POA: Diagnosis not present

## 2016-10-03 DIAGNOSIS — R300 Dysuria: Secondary | ICD-10-CM | POA: Diagnosis not present

## 2016-10-03 DIAGNOSIS — I4891 Unspecified atrial fibrillation: Secondary | ICD-10-CM | POA: Diagnosis not present

## 2016-10-08 DIAGNOSIS — R300 Dysuria: Secondary | ICD-10-CM | POA: Diagnosis not present

## 2016-10-08 DIAGNOSIS — N390 Urinary tract infection, site not specified: Secondary | ICD-10-CM | POA: Diagnosis not present

## 2016-10-23 ENCOUNTER — Ambulatory Visit (HOSPITAL_COMMUNITY)
Admission: RE | Admit: 2016-10-23 | Discharge: 2016-10-23 | Disposition: A | Discharge status: Home / Self Care | Payer: Medicare Other | Source: Ambulatory Visit | Attending: Nurse Practitioner | Admitting: Nurse Practitioner

## 2016-10-23 VITALS — BP 120/78 | HR 78 | Ht 62.0 in | Wt 114.8 lb

## 2016-10-23 DIAGNOSIS — Z882 Allergy status to sulfonamides status: Secondary | ICD-10-CM | POA: Diagnosis not present

## 2016-10-23 DIAGNOSIS — Z87440 Personal history of urinary (tract) infections: Secondary | ICD-10-CM | POA: Diagnosis not present

## 2016-10-23 DIAGNOSIS — Z90710 Acquired absence of both cervix and uterus: Secondary | ICD-10-CM | POA: Diagnosis not present

## 2016-10-23 DIAGNOSIS — Z9889 Other specified postprocedural states: Secondary | ICD-10-CM | POA: Insufficient documentation

## 2016-10-23 DIAGNOSIS — I482 Chronic atrial fibrillation: Secondary | ICD-10-CM | POA: Diagnosis not present

## 2016-10-23 DIAGNOSIS — Z85828 Personal history of other malignant neoplasm of skin: Secondary | ICD-10-CM | POA: Insufficient documentation

## 2016-10-23 DIAGNOSIS — K449 Diaphragmatic hernia without obstruction or gangrene: Secondary | ICD-10-CM | POA: Insufficient documentation

## 2016-10-23 DIAGNOSIS — K219 Gastro-esophageal reflux disease without esophagitis: Secondary | ICD-10-CM | POA: Insufficient documentation

## 2016-10-23 DIAGNOSIS — I4821 Permanent atrial fibrillation: Secondary | ICD-10-CM

## 2016-10-23 DIAGNOSIS — Z79899 Other long term (current) drug therapy: Secondary | ICD-10-CM | POA: Insufficient documentation

## 2016-10-23 DIAGNOSIS — I481 Persistent atrial fibrillation: Secondary | ICD-10-CM | POA: Diagnosis not present

## 2016-10-23 DIAGNOSIS — I4892 Unspecified atrial flutter: Secondary | ICD-10-CM | POA: Insufficient documentation

## 2016-10-23 DIAGNOSIS — E785 Hyperlipidemia, unspecified: Secondary | ICD-10-CM | POA: Insufficient documentation

## 2016-10-23 DIAGNOSIS — R0789 Other chest pain: Secondary | ICD-10-CM

## 2016-10-23 DIAGNOSIS — Z7901 Long term (current) use of anticoagulants: Secondary | ICD-10-CM | POA: Diagnosis not present

## 2016-10-23 DIAGNOSIS — E039 Hypothyroidism, unspecified: Secondary | ICD-10-CM | POA: Diagnosis not present

## 2016-10-23 DIAGNOSIS — Z888 Allergy status to other drugs, medicaments and biological substances status: Secondary | ICD-10-CM | POA: Diagnosis not present

## 2016-10-23 DIAGNOSIS — I10 Essential (primary) hypertension: Secondary | ICD-10-CM | POA: Insufficient documentation

## 2016-10-23 NOTE — Progress Notes (Signed)
Patient ID: [name], female   DOB: [birth year], 83 y.o.   MRN: [id number]     Primary Care Physician: Jani Gravel, MD Referring Physician: Dr. Purvis Sheffield Difonzo is a 83 y.o. female with a h/o persistent afib/flutter tht is currently being rate controlled having failed tikosyn/amiodarone and two cardioversion last fall/winter. She saw Dr. Caryl Comes last end of March and her labetalol was increased for HR and BP. She asked to be seen today for 3 concerns, persistent H/A's for months, shooting pains around her rt knee and feeling fullness in her chest when her heart rate gets up around 100 usually just for a few minutes. Overall, her heart rate is much improved from earlier in year. BP is well managed.She will feel her heart rate usually first thing in am before she takes her meds. She states that her HA's were present at the time she saw Dr. Caryl Comes in March and she forgot to mention, but they have become more frequent, usually several times a week and they are improved with Tylenol. She had negative CT of head 1/17. No falls, no signs of neuro deficients with H/A. She has never had H/A's and she is puzzled why they are occurring.No other change in medications. She is pending appointment with her primary next week. Her knee is slightly tender to touch but exam is unremarkable. She continues on warfarin.  Returns to [facility] 10/24, she is living in persistent afib/flutter since last year failing tikosyn/amiodarone/cardioversions. She feels that she is doing well. HR is controlled and BP at home is controlled. BP is office today is elevated. She is doing her usual activities without symptoms. Continues on warfarin but discovered last week, that coumadin  had been left out of her pre poured meds. She was seen in the coumadin clinic this am and with a INR of 1.2 and her dose was adjusted with recheck in one week.She will rarely take an extra 1/2 tab of short acting cardizem.  Returns to afib clinic 3/28  for some issues. She has noted some tightness of her chest at times that she feels is a new symptom over the last 3-4 weeks. She has also been dx and tx for a UTI during this period of time as well. The chest tightness is not necessarily  tied to exertion but will more likely to feel it  if her heart rate increases. Review of her heart rates at home show good rate control. Highest heart rate non sustained noted was 103 bpm.  Today, she denies symptoms of palpitations, chest pain, shortness of breath, orthopnea, PND, lower extremity edema, dizziness, presyncope, syncope, or neurologic sequela. The patient is tolerating medications without difficulties and is otherwise without complaint today.   Past Medical History:  Diagnosis Date  . Anxiety   . Arthritis    "back; hands" (01/13/2015)  . First degree AV block   . Gastroesophageal reflux disease   . History of hiatal hernia   . Hx of echocardiogram    echo 07/2009:  mild focal basal septal hypertrophy, EF 55-60%, mild MR, mod LAE, mild RAE, PASP 45  . Hyperlipidemia   . Hypertension   . Hypothyroidism   . Persistent atrial fibrillation ([place])       . Seasonal allergies   . Squamous carcinoma 2016   "right leg; right jawline"  . Squamous cell skin cancer   . SVT (supraventricular tachycardia) (HCC)    possible atrial tachycardia, possible junctional tacycardia  Past Surgical History:  Procedure Laterality Date  . ABDOMINAL HYSTERECTOMY  ~ 1975  . APPENDECTOMY  ~ 1975  . CARDIAC CATHETERIZATION  1990's  . CARDIOVERSION  01/13/2015   "didn't work"  . CARDIOVERSION N/A 01/13/2015   Procedure: CARDIOVERSION;  Surgeon: Josue Hector, MD;  Location: [facility] ENDOSCOPY;  Service: Cardiovascular;  Laterality: N/A;  . CARDIOVERSION N/A 06/21/2015   Procedure: CARDIOVERSION;  Surgeon: Larey Dresser, MD;  Location: [place];  Service: Cardiovascular;  Laterality: N/A;  . CATARACT EXTRACTION W/ INTRAOCULAR LENS  IMPLANT, BILATERAL Bilateral 2011    . DILATION AND CURETTAGE OF UTERUS    . ELECTROPHYSIOLOGIC STUDY N/A 07/17/2015   Procedure: Cardioversion;  Surgeon: Deboraha Sprang, MD;  Location: [facility] [facility] CV LAB;  Service: Cardiovascular;  Laterality: N/A;  . KNEE ARTHROSCOPY Right 1990's  . LAPAROSCOPIC BILATERAL SALPINGO OOPHERECTOMY Bilateral ~ 2004  . SQUAMOUS CELL CARCINOMA EXCISION  2016   "right leg; right jawline"    Current Outpatient Prescriptions  Medication Sig Dispense Refill  . acetaminophen (TYLENOL) 500 MG tablet Take 500 mg by mouth every 8 (eight) hours as needed for mild pain or moderate pain.    . calcium carbonate (TUMS - DOSED IN MG ELEMENTAL CALCIUM) 500 MG chewable tablet Chew 1 tablet by mouth daily as needed for indigestion or heartburn.    . cholecalciferol (VITAMIN D) 1000 UNITS tablet Take 1,000 Units by mouth daily.    [name] diltiazem (CARDIZEM CD) 180 MG 24 hr capsule Take 180 mg by mouth daily.     [name] diltiazem (CARDIZEM) 30 MG tablet Take 1/2 - 1 tablet every 4 hours AS NEEDED for heart rate >100 as long as blood pressure >100 30 tablet 1  . labetalol (NORMODYNE) 100 MG tablet Take 100 mg by mouth 2 (two) times daily.    [name] levothyroxine (SYNTHROID, LEVOTHROID) 50 MCG tablet Take 50 mcg by mouth daily.      . Melatonin 3 MG TABS Take 1 tablet by mouth at bedtime.    . Multiple Vitamins-Minerals (VITEYES AREDS FORMULA/LUTEIN) CAPS Take 1 capsule by mouth 2 (two) times daily.     [name] warfarin (COUMADIN) 2.5 MG tablet Take 2.5 mg by mouth daily at 6 PM.      No current facility-administered medications for this encounter.     Allergies  Allergen Reactions  . Amiodarone Nausea And Vomiting    Only at higher dose.  Able to tolerate lower dose  . Dabigatran Other (See Comments)    Unknown, per pt   . Sulfonamide Derivatives     REACTION: hives    Social History   Social History  . Marital status: Widowed    Spouse name: N/A  . Number of children: N/A  . Years of education: N/A   Occupational  History  . Not on file.   Social History Main Topics  . Smoking status: Never Smoker  . Smokeless tobacco: Never Used  . Alcohol use No  . Drug use: No  . Sexual activity: No   Other Topics Concern  . Not on file   Social History Narrative   She has two children, two grandchildren and three great grandchildren    Family History  Problem Relation Age of Onset  . Heart disease Father     ROS- All systems are reviewed and negative except as per the HPI above  Physical Exam: Vitals:   10/23/16 1552  BP: 120/78  Pulse: 78  Weight: 114 lb 12.8 oz (  52.1 kg)  Height: 5\' 2"  (1.575 m)    GEN- The patient is well appearing, alert and oriented x 3 today.   Head- normocephalic, atraumatic Eyes-  Sclera clear, conjunctiva pink Ears- hearing intact Oropharynx- clear Neck- supple, no JVP Lymph- no cervical lymphadenopathy Lungs- Clear to ausculation bilaterally, normal work of breathing Heart-irregular rate and rhythm, no murmurs, rubs or gallops, PMI not laterally displaced GI- soft, NT, ND, + BS Extremities- no clubbing, cyanosis, or edema MS- no significant deformity or atrophy Skin- no rash or lesion Psych- euthymic mood, full affect Neuro- strength and sensation are intact  EKG-atrial flutter with variable rate at 78 bpm, qrs int 80 ms, qtc 414 ms Epic records reviewed  Assessment and Plan: 1. Longstanding permanent afib/flutter Continue Cardizem and labetalol Continue warfarin She is c/o of chest tightness over the last few weeks  Will order lexi myoview, update echo  2. HTN Stable  3. Recent UTI Treated and resolved  F/u in afib clinic depending on results of the above tests      Butch Penny C. Aedin Jeansonne, [facility] [address] [place], [place] [address] [phone]

## 2016-10-23 NOTE — Patient Instructions (Signed)
Rollene Fare will call you have echo and stress test scheduled.   Follow up once results.

## 2016-10-29 DIAGNOSIS — Z7901 Long term (current) use of anticoagulants: Secondary | ICD-10-CM | POA: Diagnosis not present

## 2016-10-29 DIAGNOSIS — I4891 Unspecified atrial fibrillation: Secondary | ICD-10-CM | POA: Diagnosis not present

## 2016-10-29 DIAGNOSIS — R300 Dysuria: Secondary | ICD-10-CM | POA: Diagnosis not present

## 2016-10-30 ENCOUNTER — Inpatient Hospital Stay (HOSPITAL_COMMUNITY): Admission: RE | Admit: 2016-10-30 | Payer: Medicare Other | Source: Ambulatory Visit | Admitting: Nurse Practitioner

## 2016-11-11 ENCOUNTER — Telehealth (HOSPITAL_COMMUNITY): Payer: Self-pay | Admitting: *Deleted

## 2016-11-11 NOTE — Telephone Encounter (Signed)
Left message on voicemail in reference to upcoming appointment scheduled for 11/13/16. Phone number given for a call back so details instructions can be given. Kinzleigh Kandler, [place]

## 2016-11-12 ENCOUNTER — Telehealth (HOSPITAL_COMMUNITY): Payer: Self-pay | Admitting: *Deleted

## 2016-11-12 NOTE — Telephone Encounter (Signed)
Patient given detailed instructions per Myocardial Perfusion Study Information Sheet for the test on 11/13/16 at 1000. Patient notified to arrive 15 minutes early and that it is imperative to arrive on time for appointment to keep from having the test rescheduled.  If you need to cancel or reschedule your appointment, please call the office within 24 hours of your appointment. Failure to do so may result in a cancellation of your appointment, and a $50 no show fee. Patient verbalized understanding.Miette Molenda, [place]

## 2016-11-13 ENCOUNTER — Ambulatory Visit (HOSPITAL_COMMUNITY): Payer: Medicare Other | Attending: Nurse Practitioner

## 2016-11-13 ENCOUNTER — Ambulatory Visit (HOSPITAL_BASED_OUTPATIENT_CLINIC_OR_DEPARTMENT_OTHER): Payer: Medicare Other

## 2016-11-13 ENCOUNTER — Telehealth (HOSPITAL_COMMUNITY): Payer: Self-pay | Admitting: Nurse Practitioner

## 2016-11-13 ENCOUNTER — Other Ambulatory Visit: Payer: Self-pay

## 2016-11-13 DIAGNOSIS — I482 Chronic atrial fibrillation: Secondary | ICD-10-CM | POA: Diagnosis not present

## 2016-11-13 DIAGNOSIS — I4821 Permanent atrial fibrillation: Secondary | ICD-10-CM

## 2016-11-13 DIAGNOSIS — R0789 Other chest pain: Secondary | ICD-10-CM | POA: Insufficient documentation

## 2016-11-13 DIAGNOSIS — I083 Combined rheumatic disorders of mitral, aortic and tricuspid valves: Secondary | ICD-10-CM | POA: Diagnosis not present

## 2016-11-13 LAB — MYOCARDIAL PERFUSION IMAGING
CHL CUP NUCLEAR SDS: 8
CHL CUP NUCLEAR SRS: 7
CHL CUP NUCLEAR SSS: 13
CHL CUP RESTING HR STRESS: 94 {beats}/min
CSEPPHR: 146 {beats}/min
LV dias vol: 52 mL (ref 46–106)
LV sys vol: 18 mL
RATE: 0.3
TID: 1

## 2016-11-13 LAB — ECHOCARDIOGRAM COMPLETE
Height: 62 in
WEIGHTICAEL: 1824 [oz_av]

## 2016-11-13 MED ORDER — TECHNETIUM TC 99M TETROFOSMIN IV KIT
31.8000 | PACK | Freq: Once | INTRAVENOUS | Status: AC | PRN
  Administered 2016-11-13: 31.8 via INTRAVENOUS
  Filled 2016-11-13: qty 32

## 2016-11-13 MED ORDER — TECHNETIUM TC 99M TETROFOSMIN IV KIT
10.4000 | PACK | Freq: Once | INTRAVENOUS | Status: AC | PRN
  Administered 2016-11-13: 10.4 via INTRAVENOUS
  Filled 2016-11-13: qty 11

## 2016-11-13 MED ORDER — AMINOPHYLLINE 25 MG/ML IV SOLN
75.0000 mg | Freq: Once | INTRAVENOUS | Status: AC
  Administered 2016-11-13: 75 mg via INTRAVENOUS

## 2016-11-13 MED ORDER — REGADENOSON 0.4 MG/5ML IV SOLN
0.4000 mg | Freq: Once | INTRAVENOUS | Status: AC
  Administered 2016-11-13: 0.4 mg via INTRAVENOUS

## 2016-11-13 NOTE — Telephone Encounter (Signed)
Called pt to inform of low risk stress test and she let me know that her BP and HR have been running higher for the last couple of weeks. She is currently on labetolol 100 mg bid but at one time was taking 200 mg bid. She will increase am dose to 150 mg a day and continue 100 mg in the pm. She will call back on Monday with V/S.

## 2016-11-21 DIAGNOSIS — E7800 Pure hypercholesterolemia, unspecified: Secondary | ICD-10-CM | POA: Diagnosis not present

## 2016-11-21 DIAGNOSIS — R300 Dysuria: Secondary | ICD-10-CM | POA: Diagnosis not present

## 2016-11-21 DIAGNOSIS — I10 Essential (primary) hypertension: Secondary | ICD-10-CM | POA: Diagnosis not present

## 2016-11-25 DIAGNOSIS — Z7901 Long term (current) use of anticoagulants: Secondary | ICD-10-CM | POA: Diagnosis not present

## 2016-11-25 DIAGNOSIS — I4891 Unspecified atrial fibrillation: Secondary | ICD-10-CM | POA: Diagnosis not present

## 2016-11-26 ENCOUNTER — Other Ambulatory Visit (HOSPITAL_COMMUNITY): Payer: Self-pay | Admitting: Respiratory Therapy

## 2016-11-26 DIAGNOSIS — R0600 Dyspnea, unspecified: Secondary | ICD-10-CM

## 2016-11-26 DIAGNOSIS — R079 Chest pain, unspecified: Secondary | ICD-10-CM | POA: Diagnosis not present

## 2016-11-26 DIAGNOSIS — I10 Essential (primary) hypertension: Secondary | ICD-10-CM | POA: Diagnosis not present

## 2016-11-26 DIAGNOSIS — E039 Hypothyroidism, unspecified: Secondary | ICD-10-CM | POA: Diagnosis not present

## 2016-12-04 DIAGNOSIS — C44722 Squamous cell carcinoma of skin of right lower limb, including hip: Secondary | ICD-10-CM | POA: Diagnosis not present

## 2016-12-04 DIAGNOSIS — L570 Actinic keratosis: Secondary | ICD-10-CM | POA: Diagnosis not present

## 2016-12-04 DIAGNOSIS — D485 Neoplasm of uncertain behavior of skin: Secondary | ICD-10-CM | POA: Diagnosis not present

## 2016-12-24 DIAGNOSIS — Z7901 Long term (current) use of anticoagulants: Secondary | ICD-10-CM | POA: Diagnosis not present

## 2016-12-24 DIAGNOSIS — I4891 Unspecified atrial fibrillation: Secondary | ICD-10-CM | POA: Diagnosis not present

## 2016-12-24 DIAGNOSIS — F419 Anxiety disorder, unspecified: Secondary | ICD-10-CM | POA: Diagnosis not present

## 2016-12-25 DIAGNOSIS — C44722 Squamous cell carcinoma of skin of right lower limb, including hip: Secondary | ICD-10-CM | POA: Diagnosis not present

## 2017-01-01 DIAGNOSIS — L570 Actinic keratosis: Secondary | ICD-10-CM | POA: Diagnosis not present

## 2017-01-01 DIAGNOSIS — N183 Chronic kidney disease, stage 3 (moderate): Secondary | ICD-10-CM | POA: Diagnosis not present

## 2017-01-01 DIAGNOSIS — C44722 Squamous cell carcinoma of skin of right lower limb, including hip: Secondary | ICD-10-CM | POA: Diagnosis not present

## 2017-01-01 DIAGNOSIS — I129 Hypertensive chronic kidney disease with stage 1 through stage 4 chronic kidney disease, or unspecified chronic kidney disease: Secondary | ICD-10-CM | POA: Diagnosis not present

## 2017-01-06 ENCOUNTER — Encounter (HOSPITAL_COMMUNITY): Payer: Medicare Other

## 2017-01-07 ENCOUNTER — Ambulatory Visit (HOSPITAL_COMMUNITY)
Admission: RE | Admit: 2017-01-07 | Discharge: 2017-01-07 | Disposition: A | Discharge status: Home / Self Care | Payer: Medicare Other | Source: Ambulatory Visit | Attending: Internal Medicine | Admitting: Internal Medicine

## 2017-01-07 DIAGNOSIS — R0600 Dyspnea, unspecified: Secondary | ICD-10-CM | POA: Insufficient documentation

## 2017-01-08 DIAGNOSIS — Z7901 Long term (current) use of anticoagulants: Secondary | ICD-10-CM | POA: Diagnosis not present

## 2017-01-08 DIAGNOSIS — J449 Chronic obstructive pulmonary disease, unspecified: Secondary | ICD-10-CM | POA: Diagnosis not present

## 2017-01-08 DIAGNOSIS — Z8719 Personal history of other diseases of the digestive system: Secondary | ICD-10-CM | POA: Diagnosis not present

## 2017-01-08 DIAGNOSIS — R079 Chest pain, unspecified: Secondary | ICD-10-CM | POA: Diagnosis not present

## 2017-01-08 DIAGNOSIS — H6121 Impacted cerumen, right ear: Secondary | ICD-10-CM | POA: Diagnosis not present

## 2017-01-08 DIAGNOSIS — F419 Anxiety disorder, unspecified: Secondary | ICD-10-CM | POA: Diagnosis not present

## 2017-01-09 LAB — PULMONARY FUNCTION TEST
FEF 25-75 PRE: 0.79 L/s
FEF2575-%PRED-PRE: 103 %
FEV1-%Pred-Pre: 90 %
FEV1-Pre: 1.26 L
FEV1FVC-%Pred-Pre: 95 %
FEV6-%PRED-PRE: 102 %
FEV6-PRE: 1.81 L
FEV6FVC-%PRED-PRE: 105 %
FVC-%Pred-Pre: 96 %
FVC-PRE: 1.83 L
PRE FEV1/FVC RATIO: 69 %
PRE FEV6/FVC RATIO: 99 %
RV % pred: 85 %
RV: 2.14 L
TLC % pred: 84 %
TLC: 3.99 L

## 2017-01-13 DIAGNOSIS — F419 Anxiety disorder, unspecified: Secondary | ICD-10-CM | POA: Diagnosis not present

## 2017-01-13 DIAGNOSIS — I4891 Unspecified atrial fibrillation: Secondary | ICD-10-CM | POA: Diagnosis not present

## 2017-01-13 DIAGNOSIS — Z7901 Long term (current) use of anticoagulants: Secondary | ICD-10-CM | POA: Diagnosis not present

## 2017-02-05 DIAGNOSIS — C44722 Squamous cell carcinoma of skin of right lower limb, including hip: Secondary | ICD-10-CM | POA: Diagnosis not present

## 2017-02-17 DIAGNOSIS — Z7901 Long term (current) use of anticoagulants: Secondary | ICD-10-CM | POA: Diagnosis not present

## 2017-02-17 DIAGNOSIS — I4891 Unspecified atrial fibrillation: Secondary | ICD-10-CM | POA: Diagnosis not present

## 2017-02-18 DIAGNOSIS — F419 Anxiety disorder, unspecified: Secondary | ICD-10-CM | POA: Diagnosis not present

## 2017-02-18 DIAGNOSIS — K219 Gastro-esophageal reflux disease without esophagitis: Secondary | ICD-10-CM | POA: Diagnosis not present

## 2017-02-25 ENCOUNTER — Telehealth (HOSPITAL_COMMUNITY): Payer: Self-pay | Admitting: *Deleted

## 2017-02-25 DIAGNOSIS — N390 Urinary tract infection, site not specified: Secondary | ICD-10-CM | POA: Diagnosis not present

## 2017-02-25 NOTE — Telephone Encounter (Signed)
Pts daughter cld reporting that the patient is not feeling well, having chest tightness, no pain, chills but no fever and elevated bps 159/98 168/92.  Pts HR has been around 98.  She stated that she is taking a urine specimen to the PCP to check for infection but they advised her that some of this may be cardiac and to check with heartcare.  She was advised that if the chest tightness and chills persist or get worse through the night she will need to take the pt to the ED.  Pts BP is consistently elevated and has in the past also had the problem with the chest tightness for which she has had stress test.  Appt was offered and made for tomorrow with Roderic Palau, NP at 2:30 and if the pt symptoms get worse of persist the daughter has expressed she will take her to ER.

## 2017-02-26 ENCOUNTER — Ambulatory Visit (HOSPITAL_COMMUNITY)
Admission: RE | Admit: 2017-02-26 | Discharge: 2017-02-26 | Disposition: A | Discharge status: Home / Self Care | Payer: Medicare Other | Source: Ambulatory Visit | Attending: Nurse Practitioner | Admitting: Nurse Practitioner

## 2017-02-26 ENCOUNTER — Encounter (HOSPITAL_COMMUNITY): Payer: Self-pay | Admitting: Nurse Practitioner

## 2017-02-26 VITALS — BP 124/76 | HR 81 | Ht 62.0 in | Wt 113.0 lb

## 2017-02-26 DIAGNOSIS — I10 Essential (primary) hypertension: Secondary | ICD-10-CM | POA: Diagnosis not present

## 2017-02-26 DIAGNOSIS — R42 Dizziness and giddiness: Secondary | ICD-10-CM

## 2017-02-26 DIAGNOSIS — F419 Anxiety disorder, unspecified: Secondary | ICD-10-CM | POA: Diagnosis not present

## 2017-02-26 DIAGNOSIS — Z882 Allergy status to sulfonamides status: Secondary | ICD-10-CM | POA: Insufficient documentation

## 2017-02-26 DIAGNOSIS — I482 Chronic atrial fibrillation: Secondary | ICD-10-CM | POA: Insufficient documentation

## 2017-02-26 DIAGNOSIS — Z79899 Other long term (current) drug therapy: Secondary | ICD-10-CM | POA: Diagnosis not present

## 2017-02-26 DIAGNOSIS — Z7901 Long term (current) use of anticoagulants: Secondary | ICD-10-CM | POA: Diagnosis not present

## 2017-02-26 DIAGNOSIS — I4892 Unspecified atrial flutter: Secondary | ICD-10-CM | POA: Diagnosis not present

## 2017-02-26 DIAGNOSIS — Z85828 Personal history of other malignant neoplasm of skin: Secondary | ICD-10-CM | POA: Insufficient documentation

## 2017-02-26 DIAGNOSIS — K219 Gastro-esophageal reflux disease without esophagitis: Secondary | ICD-10-CM | POA: Insufficient documentation

## 2017-02-26 DIAGNOSIS — E039 Hypothyroidism, unspecified: Secondary | ICD-10-CM | POA: Diagnosis not present

## 2017-02-26 DIAGNOSIS — E785 Hyperlipidemia, unspecified: Secondary | ICD-10-CM | POA: Insufficient documentation

## 2017-02-26 LAB — CBC
HEMATOCRIT: 40.5 % (ref 36.0–46.0)
Hemoglobin: 13.1 g/dL (ref 12.0–15.0)
MCH: 25.7 pg — ABNORMAL LOW (ref 26.0–34.0)
MCHC: 32.3 g/dL (ref 30.0–36.0)
MCV: 79.6 fL (ref 78.0–100.0)
PLATELETS: 252 10*3/uL (ref 150–400)
RBC: 5.09 MIL/uL (ref 3.87–5.11)
RDW: 14.4 % (ref 11.5–15.5)
WBC: 7.5 10*3/uL (ref 4.0–10.5)

## 2017-02-26 LAB — COMPREHENSIVE METABOLIC PANEL
ALBUMIN: 4 g/dL (ref 3.5–5.0)
ALK PHOS: 91 U/L (ref 38–126)
ALT: 16 U/L (ref 14–54)
ANION GAP: 10 (ref 5–15)
AST: 24 U/L (ref 15–41)
BUN: 21 mg/dL — ABNORMAL HIGH (ref 6–20)
CHLORIDE: 100 mmol/L — AB (ref 101–111)
CO2: 25 mmol/L (ref 22–32)
Calcium: 9.5 mg/dL (ref 8.9–10.3)
Creatinine, Ser: 1.34 mg/dL — ABNORMAL HIGH (ref 0.44–1.00)
GFR calc non Af Amer: 34 mL/min — ABNORMAL LOW (ref >60)
GFR, EST AFRICAN AMERICAN: 39 mL/min — AB (ref >60)
GLUCOSE: 114 mg/dL — AB (ref 65–99)
Potassium: 4 mmol/L (ref 3.5–5.1)
SODIUM: 135 mmol/L (ref 135–145)
Total Bilirubin: 0.8 mg/dL (ref 0.3–1.2)
Total Protein: 6.7 g/dL (ref 6.5–8.1)

## 2017-02-26 LAB — TSH: TSH: 1.696 u[IU]/mL (ref 0.350–4.500)

## 2017-02-26 NOTE — Progress Notes (Signed)
Patient ID: [name], female   DOB: 03/30/1925, 83 y.o.   MRN: [id number]     Primary Care Physician: Jani Gravel, MD Referring Physician: Dr. Purvis Sheffield [name] is a 83 y.o. female with a h/o persistent afib/flutter tht is currently being rate controlled having failed tikosyn/amiodarone and two cardioversion last fall/winter. She saw Dr. Caryl Comes last end of March and her labetalol was increased for HR and BP. She asked to be seen today for 3 concerns, persistent H/A's for months, shooting pains around her rt knee and feeling fullness in her chest when her heart rate gets up around 100 usually just for a few minutes. Overall, her heart rate is much improved from earlier in year. BP is well managed.She will feel her heart rate usually first thing in am before she takes her meds. She states that her HA's were present at the time she saw Dr. Caryl Comes in March and she forgot to mention, but they have become more frequent, usually several times a week and they are improved with Tylenol. She had negative CT of head 1/17. No falls, no signs of neuro deficients with H/A. She has never had H/A's and she is puzzled why they are occurring.No other change in medications. She is pending appointment with her primary next week. Her knee is slightly tender to touch but exam is unremarkable. She continues on warfarin.  Returns to [facility] clinic 10/24, she is living in persistent afib/flutter since last year failing tikosyn/amiodarone/cardioversions. She feels that she is doing well. HR is controlled and BP at home is controlled. BP is office today is elevated. She is doing her usual activities without symptoms. Continues on warfarin but discovered last week, that coumadin  had been left out of her pre poured meds. She was seen in the coumadin clinic this am and with a INR of 1.2 and her dose was adjusted with recheck in one week.She will rarely take an extra 1/2 tab of short acting cardizem.  Returns to [facility]  10/23/16 for some issues. She has noted some tightness of her chest at times that she feels is a new symptom over the last 3-4 weeks. She has also been dx and tx for a UTI during this period of time as well. The chest tightness is not necessarily  tied to exertion but will more likely to feel it  if her heart rate increases. Review of her heart rates at home show good rate control. Highest heart rate non sustained noted was 103 bpm.  F/u in afib clinic 8/1. She is being seen as her daughter called to the office yesterday for weakness, dizziness, off balance, chills, chest pain.[name] She was told to go to the ER if symptoms worsened but daughter wanted an appointment today. She had a stress test a few months ago and was low risk. She has had intermittent chest discomfort now for several months. Does not seem to be exertional and she points to the epigastric area as source of discomfort.This has now resolved. The onset of dizziness yesterday was present when she woke up and improved yesterday afternoon with dramamine. Today, she is weak. No further chills. Specimen of urine was sent to PCP and was clean. The dizziness is better today as well. No shortness of breath. She has chronic atrial flutter and the rate is controlled.Contoinues on warfarin. No obvious bleeding.  Today, she denies symptoms of palpitations, chest pain, shortness of breath, orthopnea, PND, lower extremity edema , presyncope, syncope,  or neurologic sequela. Positive for weakness, dizziness yesterday has resolved. The patient is tolerating medications without difficulties and is otherwise without complaint today.   Past Medical History:  Diagnosis Date  . Anxiety   . Arthritis    "back; hands" (01/13/2015)  . First degree AV block   . Gastroesophageal reflux disease   . History of hiatal hernia   . Hx of echocardiogram    echo 07/2009:  mild focal basal septal hypertrophy, EF 55-60%, mild MR, mod LAE, mild RAE, PASP 45  . Hyperlipidemia   .  Hypertension   . Hypothyroidism   . Persistent atrial fibrillation ([place])       . Seasonal allergies   . Squamous carcinoma 2016   "right leg; right jawline"  . Squamous cell skin cancer   . SVT (supraventricular tachycardia) (HCC)    possible atrial tachycardia, possible junctional tacycardia   Past Surgical History:  Procedure Laterality Date  . ABDOMINAL HYSTERECTOMY  ~ 1975  . APPENDECTOMY  ~ 1975  . CARDIAC CATHETERIZATION  1990's  . CARDIOVERSION  01/13/2015   "didn't work"  . CARDIOVERSION N/A 01/13/2015   Procedure: CARDIOVERSION;  Surgeon: Josue Hector, MD;  Location: [facility] ENDOSCOPY;  Service: Cardiovascular;  Laterality: N/A;  . CARDIOVERSION N/A 06/21/2015   Procedure: CARDIOVERSION;  Surgeon: Larey Dresser, MD;  Location: [place];  Service: Cardiovascular;  Laterality: N/A;  . CATARACT EXTRACTION W/ INTRAOCULAR LENS  IMPLANT, BILATERAL Bilateral 2011  . DILATION AND CURETTAGE OF UTERUS    . ELECTROPHYSIOLOGIC STUDY N/A 07/17/2015   Procedure: Cardioversion;  Surgeon: Deboraha Sprang, MD;  Location: [place] CV LAB;  Service: Cardiovascular;  Laterality: N/A;  . KNEE ARTHROSCOPY Right 1990's  . LAPAROSCOPIC BILATERAL SALPINGO OOPHERECTOMY Bilateral ~ 2004  . SQUAMOUS CELL CARCINOMA EXCISION  2016   "right leg; right jawline"    Current Outpatient Prescriptions  Medication Sig Dispense Refill  . acetaminophen (TYLENOL) 500 MG tablet Take 500 mg by mouth every 8 (eight) hours as needed for mild pain or moderate pain.    . calcium carbonate (TUMS - DOSED IN MG ELEMENTAL CALCIUM) 500 MG chewable tablet Chew 1 tablet by mouth daily as needed for indigestion or heartburn.    . cholecalciferol (VITAMIN D) 1000 UNITS tablet Take 1,000 Units by mouth daily.    [name] diltiazem (CARDIZEM CD) 180 MG 24 hr capsule Take 180 mg by mouth daily.     [name] diltiazem (CARDIZEM) 30 MG tablet Take 1/2 - 1 tablet every 4 hours AS NEEDED for heart rate >100 as long as blood pressure >100 30  tablet 1  . INCRUSE ELLIPTA 62.5 MCG/INH AEPB TAKE 1 PUFF BY MOUTH EVERY DAY  5  . labetalol (NORMODYNE) 100 MG tablet Take 100 mg by mouth 2 (two) times daily.    [name] levothyroxine (SYNTHROID, LEVOTHROID) 50 MCG tablet Take 50 mcg by mouth daily.      . Melatonin 3 MG TABS Take 1 tablet by mouth at bedtime.    . Multiple Vitamins-Minerals (VITEYES AREDS FORMULA/LUTEIN) CAPS Take 1 capsule by mouth 2 (two) times daily.     [name] omeprazole (PRILOSEC) 40 MG capsule Take by mouth daily.  1  . warfarin (COUMADIN) 2.5 MG tablet Take 2.5 mg by mouth daily at 6 PM.      No current facility-administered medications for this encounter.     Allergies  Allergen Reactions  . Amiodarone Nausea And Vomiting    Only at higher dose.  Able  to tolerate lower dose  . Dabigatran Other (See Comments)    Unknown, per pt   . Sulfonamide Derivatives     REACTION: hives    Social History   Social History  . Marital status: Widowed    Spouse name: N/A  . Number of children: N/A  . Years of education: N/A   Occupational History  . Not on file.   Social History Main Topics  . Smoking status: Never Smoker  . Smokeless tobacco: Never Used  . Alcohol use No  . Drug use: No  . Sexual activity: No   Other Topics Concern  . Not on file   Social History Narrative   She has two children, two grandchildren and three great grandchildren    Family History  Problem Relation Age of Onset  . Heart disease Father     ROS- All systems are reviewed and negative except as per the HPI above  Physical Exam: Vitals:   02/26/17 1442  BP: 124/76  Pulse: 81  Weight: 113 lb (51.3 kg)  Height: 5\' 2"  (1.575 m)    GEN- The patient is well appearing, alert and oriented x 3 today.   Head- normocephalic, atraumatic Eyes-  Sclera clear, conjunctiva pink Ears- hearing intact Oropharynx- clear Neck- supple, no JVP Lymph- no cervical lymphadenopathy Lungs- Clear to ausculation bilaterally, normal work of  breathing Heart-irregular rate and rhythm, no murmurs, rubs or gallops, PMI not laterally displaced GI- soft, NT, ND, + BS Extremities- no clubbing, cyanosis, or edema MS- no significant deformity or atrophy Skin- no rash or lesion Psych- euthymic mood, full affect Neuro- strength and sensation are intact  EKG-atrial flutter with variable rate at 81 bpm, qrs int 76 ms, qtc 443 ms Epic records reviewed lexi myoview-11/13/16- Study Highlights    Nuclear stress EF: 65%.  Baseline EKG showed atrial fibrillation with nonspecific ST abnormality. During the infusion there was 48mm of horizontal to downsloping ST segment depression in the inferolateral leads  There is a small defect of mild severity present in the basal inferoseptal and mid inferoseptal location. The defect is non-reversible and in the setting of normal LVF, likely represents attenuation artifact. No ischemia noted.  This is a low risk study. Nondiagnostic EKG changes given baseline ST abnormality.  The left ventricular ejection fraction is normal (55-65%).   Echo-LV EF: 55% -   60% Study Conclusions  - Left ventricle: The cavity size was normal. There was mild   concentric hypertrophy. Systolic function was normal. The   estimated ejection fraction was in the range of 55% to 60%. Wall   motion was normal; there were no regional wall motion   abnormalities. - Aortic valve: Transvalvular velocity was within the normal range.   There was no stenosis. There was trivial regurgitation. - Mitral valve: Mildly calcified annulus. Transvalvular velocity   was within the normal range. There was no evidence for stenosis.   There was trivial regurgitation. - Left atrium: The atrium was severely dilated. - Right ventricle: The cavity size was normal. Wall thickness was   normal. Systolic function was normal. - Right atrium: The atrium was mildly dilated. - Atrial septum: No defect or patent foramen ovale was identified   by  color flow Doppler. - Tricuspid valve: There was mild regurgitation. - Pulmonary arteries: Systolic pressure was within the normal   range. PA peak pressure: 32 mm Hg (S).   Assessment and Plan: 1. Longstanding permanent afib/flutter Rate controlled Continue Cardizem and labetalol Continue  warfarin for chadsvasc score of at least 4 Cmet, tsh, cbc today  2. HTN Stable  3. Dizziness Came on suddenly and is better today Improved with dramamine Sounds like vertigo If it persists, then f/u with PCP  F/u in afib clinic as needed   Butch Penny C. Shalini Mair, [facility] [address] [place], [place] [address] [phone]

## 2017-03-04 DIAGNOSIS — R42 Dizziness and giddiness: Secondary | ICD-10-CM | POA: Diagnosis not present

## 2017-03-05 DIAGNOSIS — L820 Inflamed seborrheic keratosis: Secondary | ICD-10-CM | POA: Diagnosis not present

## 2017-03-05 DIAGNOSIS — D485 Neoplasm of uncertain behavior of skin: Secondary | ICD-10-CM | POA: Diagnosis not present

## 2017-04-01 DIAGNOSIS — Z7901 Long term (current) use of anticoagulants: Secondary | ICD-10-CM | POA: Diagnosis not present

## 2017-04-01 DIAGNOSIS — I4891 Unspecified atrial fibrillation: Secondary | ICD-10-CM | POA: Diagnosis not present

## 2017-04-15 DIAGNOSIS — I4891 Unspecified atrial fibrillation: Secondary | ICD-10-CM | POA: Diagnosis not present

## 2017-04-15 DIAGNOSIS — Z7901 Long term (current) use of anticoagulants: Secondary | ICD-10-CM | POA: Diagnosis not present

## 2017-05-01 DIAGNOSIS — H40013 Open angle with borderline findings, low risk, bilateral: Secondary | ICD-10-CM | POA: Diagnosis not present

## 2017-05-01 DIAGNOSIS — H3561 Retinal hemorrhage, right eye: Secondary | ICD-10-CM | POA: Diagnosis not present

## 2017-05-01 DIAGNOSIS — H353122 Nonexudative age-related macular degeneration, left eye, intermediate dry stage: Secondary | ICD-10-CM | POA: Diagnosis not present

## 2017-05-01 DIAGNOSIS — H353211 Exudative age-related macular degeneration, right eye, with active choroidal neovascularization: Secondary | ICD-10-CM | POA: Diagnosis not present

## 2017-05-12 ENCOUNTER — Encounter (INDEPENDENT_AMBULATORY_CARE_PROVIDER_SITE_OTHER): Payer: Medicare Other | Admitting: Ophthalmology

## 2017-05-14 ENCOUNTER — Encounter (INDEPENDENT_AMBULATORY_CARE_PROVIDER_SITE_OTHER): Payer: Medicare Other | Admitting: Ophthalmology

## 2017-05-14 DIAGNOSIS — H353132 Nonexudative age-related macular degeneration, bilateral, intermediate dry stage: Secondary | ICD-10-CM

## 2017-05-14 DIAGNOSIS — H35033 Hypertensive retinopathy, bilateral: Secondary | ICD-10-CM | POA: Diagnosis not present

## 2017-05-14 DIAGNOSIS — I10 Essential (primary) hypertension: Secondary | ICD-10-CM

## 2017-05-14 DIAGNOSIS — H43813 Vitreous degeneration, bilateral: Secondary | ICD-10-CM | POA: Diagnosis not present

## 2017-05-15 DIAGNOSIS — I4891 Unspecified atrial fibrillation: Secondary | ICD-10-CM | POA: Diagnosis not present

## 2017-05-15 DIAGNOSIS — Z7901 Long term (current) use of anticoagulants: Secondary | ICD-10-CM | POA: Diagnosis not present

## 2017-06-03 DIAGNOSIS — I10 Essential (primary) hypertension: Secondary | ICD-10-CM | POA: Diagnosis not present

## 2017-06-03 DIAGNOSIS — E559 Vitamin D deficiency, unspecified: Secondary | ICD-10-CM | POA: Diagnosis not present

## 2017-06-03 DIAGNOSIS — E039 Hypothyroidism, unspecified: Secondary | ICD-10-CM | POA: Diagnosis not present

## 2017-06-03 DIAGNOSIS — E785 Hyperlipidemia, unspecified: Secondary | ICD-10-CM | POA: Diagnosis not present

## 2017-06-09 DIAGNOSIS — R2681 Unsteadiness on feet: Secondary | ICD-10-CM | POA: Diagnosis not present

## 2017-06-09 DIAGNOSIS — E039 Hypothyroidism, unspecified: Secondary | ICD-10-CM | POA: Diagnosis not present

## 2017-06-09 DIAGNOSIS — Z23 Encounter for immunization: Secondary | ICD-10-CM | POA: Diagnosis not present

## 2017-06-09 DIAGNOSIS — E7800 Pure hypercholesterolemia, unspecified: Secondary | ICD-10-CM | POA: Diagnosis not present

## 2017-06-09 DIAGNOSIS — F419 Anxiety disorder, unspecified: Secondary | ICD-10-CM | POA: Diagnosis not present

## 2017-06-16 DIAGNOSIS — Z7901 Long term (current) use of anticoagulants: Secondary | ICD-10-CM | POA: Diagnosis not present

## 2017-06-16 DIAGNOSIS — I4891 Unspecified atrial fibrillation: Secondary | ICD-10-CM | POA: Diagnosis not present

## 2017-06-18 DIAGNOSIS — E039 Hypothyroidism, unspecified: Secondary | ICD-10-CM | POA: Diagnosis not present

## 2017-06-18 DIAGNOSIS — N183 Chronic kidney disease, stage 3 (moderate): Secondary | ICD-10-CM | POA: Diagnosis not present

## 2017-06-18 DIAGNOSIS — I129 Hypertensive chronic kidney disease with stage 1 through stage 4 chronic kidney disease, or unspecified chronic kidney disease: Secondary | ICD-10-CM | POA: Diagnosis not present

## 2017-06-18 DIAGNOSIS — M810 Age-related osteoporosis without current pathological fracture: Secondary | ICD-10-CM | POA: Diagnosis not present

## 2017-06-18 DIAGNOSIS — I4891 Unspecified atrial fibrillation: Secondary | ICD-10-CM | POA: Diagnosis not present

## 2017-06-18 DIAGNOSIS — I10 Essential (primary) hypertension: Secondary | ICD-10-CM | POA: Diagnosis not present

## 2017-06-18 DIAGNOSIS — E785 Hyperlipidemia, unspecified: Secondary | ICD-10-CM | POA: Diagnosis not present

## 2017-07-15 DIAGNOSIS — I4891 Unspecified atrial fibrillation: Secondary | ICD-10-CM | POA: Diagnosis not present

## 2017-07-15 DIAGNOSIS — Z7901 Long term (current) use of anticoagulants: Secondary | ICD-10-CM | POA: Diagnosis not present

## 2017-08-05 DIAGNOSIS — I4891 Unspecified atrial fibrillation: Secondary | ICD-10-CM | POA: Diagnosis not present

## 2017-08-05 DIAGNOSIS — Z7901 Long term (current) use of anticoagulants: Secondary | ICD-10-CM | POA: Diagnosis not present

## 2017-08-27 DIAGNOSIS — I4891 Unspecified atrial fibrillation: Secondary | ICD-10-CM | POA: Diagnosis not present

## 2017-08-27 DIAGNOSIS — I129 Hypertensive chronic kidney disease with stage 1 through stage 4 chronic kidney disease, or unspecified chronic kidney disease: Secondary | ICD-10-CM | POA: Diagnosis not present

## 2017-08-27 DIAGNOSIS — E785 Hyperlipidemia, unspecified: Secondary | ICD-10-CM | POA: Diagnosis not present

## 2017-08-27 DIAGNOSIS — I10 Essential (primary) hypertension: Secondary | ICD-10-CM | POA: Diagnosis not present

## 2017-08-27 DIAGNOSIS — E039 Hypothyroidism, unspecified: Secondary | ICD-10-CM | POA: Diagnosis not present

## 2017-08-27 DIAGNOSIS — N183 Chronic kidney disease, stage 3 (moderate): Secondary | ICD-10-CM | POA: Diagnosis not present

## 2017-08-27 DIAGNOSIS — M810 Age-related osteoporosis without current pathological fracture: Secondary | ICD-10-CM | POA: Diagnosis not present

## 2017-09-02 DIAGNOSIS — I4891 Unspecified atrial fibrillation: Secondary | ICD-10-CM | POA: Diagnosis not present

## 2017-09-02 DIAGNOSIS — Z7901 Long term (current) use of anticoagulants: Secondary | ICD-10-CM | POA: Diagnosis not present

## 2017-09-22 IMAGING — NM NM MISC PROCEDURE
3 series · 18 of 18 positions shown · non-contrast
Comparison: none

[Series 1: wbr_s-proj_st stress_(id)_sa · 6.5mm · 6.51mm/px · 6 of 512 frames shown (1 of 2)]
[frame 43/512]
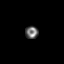
[frame 128/512]
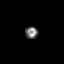
[frame 214/512]
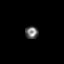
[frame 299/512]
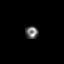
[frame 384/512]
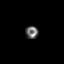
[frame 470/512]
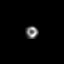

[Series 1: wbr_s-proj_st stress_(id)_sa · 6.5mm · 6.51mm/px · 6 of 64 frames shown (2 of 2)]
[frame 6/64]
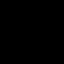
[frame 16/64]
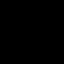
[frame 27/64]
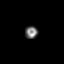
[frame 38/64]
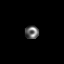
[frame 48/64]
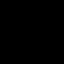
[frame 59/64]
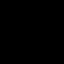

[Series 1: wbr_r-proj_st rest_(id)_sa · 6.5mm · 6.51mm/px · 6 of 64 frames shown]
[frame 6/64]
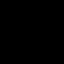
[frame 16/64]
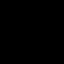
[frame 27/64]
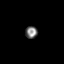
[frame 38/64]
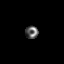
[frame 48/64]
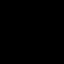
[frame 59/64]
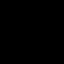

[18 of 18 positions shown; findings below may reference images not displayed]

Canned report from images found in remote index.

Refer to host system for actual result text.

## 2017-09-29 ENCOUNTER — Encounter (HOSPITAL_COMMUNITY): Payer: Self-pay | Admitting: Nurse Practitioner

## 2017-09-29 ENCOUNTER — Ambulatory Visit (HOSPITAL_COMMUNITY)
Admission: RE | Admit: 2017-09-29 | Discharge: 2017-09-29 | Disposition: A | Discharge status: Home / Self Care | Payer: Medicare Other | Source: Ambulatory Visit | Attending: Nurse Practitioner | Admitting: Nurse Practitioner

## 2017-09-29 VITALS — BP 144/72 | HR 76 | Ht 62.0 in | Wt 117.0 lb

## 2017-09-29 DIAGNOSIS — Z90710 Acquired absence of both cervix and uterus: Secondary | ICD-10-CM | POA: Insufficient documentation

## 2017-09-29 DIAGNOSIS — Z79890 Hormone replacement therapy (postmenopausal): Secondary | ICD-10-CM | POA: Diagnosis not present

## 2017-09-29 DIAGNOSIS — Z961 Presence of intraocular lens: Secondary | ICD-10-CM | POA: Diagnosis not present

## 2017-09-29 DIAGNOSIS — M1990 Unspecified osteoarthritis, unspecified site: Secondary | ICD-10-CM | POA: Insufficient documentation

## 2017-09-29 DIAGNOSIS — Z7901 Long term (current) use of anticoagulants: Secondary | ICD-10-CM | POA: Insufficient documentation

## 2017-09-29 DIAGNOSIS — E039 Hypothyroidism, unspecified: Secondary | ICD-10-CM | POA: Insufficient documentation

## 2017-09-29 DIAGNOSIS — K219 Gastro-esophageal reflux disease without esophagitis: Secondary | ICD-10-CM | POA: Insufficient documentation

## 2017-09-29 DIAGNOSIS — Z79899 Other long term (current) drug therapy: Secondary | ICD-10-CM | POA: Diagnosis not present

## 2017-09-29 DIAGNOSIS — Z9841 Cataract extraction status, right eye: Secondary | ICD-10-CM | POA: Insufficient documentation

## 2017-09-29 DIAGNOSIS — I482 Chronic atrial fibrillation, unspecified: Secondary | ICD-10-CM

## 2017-09-29 DIAGNOSIS — I481 Persistent atrial fibrillation: Secondary | ICD-10-CM | POA: Diagnosis present

## 2017-09-29 DIAGNOSIS — E785 Hyperlipidemia, unspecified: Secondary | ICD-10-CM | POA: Insufficient documentation

## 2017-09-29 DIAGNOSIS — R600 Localized edema: Secondary | ICD-10-CM | POA: Diagnosis not present

## 2017-09-29 DIAGNOSIS — I4892 Unspecified atrial flutter: Secondary | ICD-10-CM | POA: Insufficient documentation

## 2017-09-29 DIAGNOSIS — I10 Essential (primary) hypertension: Secondary | ICD-10-CM | POA: Diagnosis not present

## 2017-09-29 DIAGNOSIS — F419 Anxiety disorder, unspecified: Secondary | ICD-10-CM | POA: Insufficient documentation

## 2017-09-29 DIAGNOSIS — Z9842 Cataract extraction status, left eye: Secondary | ICD-10-CM | POA: Insufficient documentation

## 2017-09-29 DIAGNOSIS — Z888 Allergy status to other drugs, medicaments and biological substances status: Secondary | ICD-10-CM | POA: Insufficient documentation

## 2017-09-29 DIAGNOSIS — Z8249 Family history of ischemic heart disease and other diseases of the circulatory system: Secondary | ICD-10-CM | POA: Insufficient documentation

## 2017-09-29 DIAGNOSIS — Z85828 Personal history of other malignant neoplasm of skin: Secondary | ICD-10-CM | POA: Diagnosis not present

## 2017-09-29 DIAGNOSIS — Z882 Allergy status to sulfonamides status: Secondary | ICD-10-CM | POA: Diagnosis not present

## 2017-09-29 NOTE — Progress Notes (Signed)
Patient ID: [name], female   DOB: September 20, 1924, 84 y.o.   MRN: [id number]     Primary Care Physician: Jani Gravel, MD Referring Physician: Dr. Purvis Sheffield Girgenti is a 84 y.o. female with a h/o persistent afib/flutter tht is currently being rate controlled having failed tikosyn/amiodarone and two cardioversion last fall/winter. She saw Dr. Caryl Comes last end of March and her labetalol was increased for HR and BP. She asked to be seen today for 3 concerns, persistent H/A's for months, shooting pains around her rt knee and feeling fullness in her chest when her heart rate gets up around 100 usually just for a few minutes. Overall, her heart rate is much improved from earlier in year. BP is well managed.She will feel her heart rate usually first thing in am before she takes her meds. She states that her HA's were present at the time she saw Dr. Caryl Comes in March and she forgot to mention, but they have become more frequent, usually several times a week and they are improved with Tylenol. She had negative CT of head 1/17. No falls, no signs of neuro deficients with H/A. She has never had H/A's and she is puzzled why they are occurring.No other change in medications. She is pending appointment with her primary next week. Her knee is slightly tender to touch but exam is unremarkable. She continues on warfarin.  Returns to [place] [facility] 10/24, she is living in persistent afib/flutter since last year failing tikosyn/amiodarone/cardioversions. She feels that she is doing well. HR is controlled and BP at home is controlled. BP is office today is elevated. She is doing her usual activities without symptoms. Continues on warfarin but discovered last week, that coumadin  had been left out of her pre poured meds. She was seen in the coumadin clinic this am and with a INR of 1.2 and her dose was adjusted with recheck in one week.She will rarely take an extra 1/2 tab of short acting cardizem.  Returns to [place] clinic  10/23/16 for some issues. She has noted some tightness of her chest at times that she feels is a new symptom over the last 3-4 weeks. She has also been dx and tx for a UTI during this period of time as well. The chest tightness is not necessarily  tied to exertion but will more likely to feel it  if her heart rate increases. Review of her heart rates at home show good rate control. Highest heart rate non sustained noted was 103 bpm.  F/u in afib clinic 02/26/17. She is being seen as her daughter called to the office yesterday for weakness, dizziness, off balance, chills, chest pain.[name] She was told to go to the ER if symptoms worsened but daughter wanted an appointment today. She had a stress test a few months ago and was low risk. She has had intermittent chest discomfort now for several months. Does not seem to be exertional and she points to the epigastric area as source of discomfort.This has now resolved. The onset of dizziness yesterday was present when she woke up and improved yesterday afternoon with dramamine. Today, she is weak. No further chills. Specimen of urine was sent to PCP and was clean. The dizziness is better today as well. No shortness of breath. She has chronic atrial flutter and the rate is controlled.Contoinues on warfarin. No obvious bleeding.  F/u afib clinic 09/29/17. She called last week wanting to be seen for elevated BP and swollen ankles.At that  time, over the phone, she was eating a lot of canned soup and cheese and dangling her feet watching TV. She was asked to elevate feet when sitting and reduce salt intake. She has done these things and today, BP is stable at 144/72 and her pedal edema has resolved. She feels her afib sometime, but for the most part, it is well rate controlled.  Today, she denies symptoms of palpitations, chest pain, shortness of breath, orthopnea, PND, lower extremity edema , presyncope, syncope, or neurologic sequela. Positive for weakness, dizziness yesterday  has resolved. The patient is tolerating medications without difficulties and is otherwise without complaint today.   Past Medical History:  Diagnosis Date  . Anxiety   . Arthritis    "back; hands" (01/13/2015)  . First degree AV block   . Gastroesophageal reflux disease   . History of hiatal hernia   . Hx of echocardiogram    echo 07/2009:  mild focal basal septal hypertrophy, EF 55-60%, mild MR, mod LAE, mild RAE, PASP 45  . Hyperlipidemia   . Hypertension   . Hypothyroidism   . Persistent atrial fibrillation ([place])       . Seasonal allergies   . Squamous carcinoma 2016   "right leg; right jawline"  . Squamous cell skin cancer   . SVT (supraventricular tachycardia) (HCC)    possible atrial tachycardia, possible junctional tacycardia   Past Surgical History:  Procedure Laterality Date  . ABDOMINAL HYSTERECTOMY  ~ 1975  . APPENDECTOMY  ~ 1975  . CARDIAC CATHETERIZATION  1990's  . CARDIOVERSION  01/13/2015   "didn't work"  . CARDIOVERSION N/A 01/13/2015   Procedure: CARDIOVERSION;  Surgeon: Josue Hector, MD;  Location: [facility] ENDOSCOPY;  Service: Cardiovascular;  Laterality: N/A;  . CARDIOVERSION N/A 06/21/2015   Procedure: CARDIOVERSION;  Surgeon: Larey Dresser, MD;  Location: [facility];  Service: Cardiovascular;  Laterality: N/A;  . CATARACT EXTRACTION W/ INTRAOCULAR LENS  IMPLANT, BILATERAL Bilateral 2011  . DILATION AND CURETTAGE OF UTERUS    . ELECTROPHYSIOLOGIC STUDY N/A 07/17/2015   Procedure: Cardioversion;  Surgeon: Deboraha Sprang, MD;  Location: [place] CV LAB;  Service: Cardiovascular;  Laterality: N/A;  . KNEE ARTHROSCOPY Right 1990's  . LAPAROSCOPIC BILATERAL SALPINGO OOPHERECTOMY Bilateral ~ 2004  . SQUAMOUS CELL CARCINOMA EXCISION  2016   "right leg; right jawline"    Current Outpatient Medications  Medication Sig Dispense Refill  . acetaminophen (TYLENOL) 500 MG tablet Take 500 mg by mouth every 8 (eight) hours as needed for mild pain or moderate pain.     . calcium carbonate (TUMS - DOSED IN MG ELEMENTAL CALCIUM) 500 MG chewable tablet Chew 1 tablet by mouth daily as needed for indigestion or heartburn.    . cholecalciferol (VITAMIN D) 1000 UNITS tablet Take 1,000 Units by mouth daily.    [name] diltiazem (CARDIZEM CD) 180 MG 24 hr capsule Take 180 mg by mouth daily.     [name] diltiazem (CARDIZEM) 30 MG tablet Take 1/2 - 1 tablet every 4 hours AS NEEDED for heart rate >100 as long as blood pressure >100 30 tablet 1  . INCRUSE ELLIPTA 62.5 MCG/INH AEPB TAKE 1 PUFF BY MOUTH EVERY DAY  5  . labetalol (NORMODYNE) 100 MG tablet Take 100 mg by mouth 2 (two) times daily. Take an extra half tablet in the morning    . levothyroxine (SYNTHROID, LEVOTHROID) 50 MCG tablet Take 50 mcg by mouth daily.      . Melatonin 3 MG TABS  Take 1 tablet by mouth at bedtime.    . Multiple Vitamins-Minerals (VITEYES AREDS FORMULA/LUTEIN) CAPS Take 1 capsule by mouth 2 (two) times daily.     [name] omeprazole (PRILOSEC) 40 MG capsule Take by mouth daily.  1  . warfarin (COUMADIN) 2.5 MG tablet Take 2.5 mg by mouth daily at 6 PM.      No current facility-administered medications for this encounter.     Allergies  Allergen Reactions  . Amiodarone Nausea And Vomiting    Only at higher dose.  Able to tolerate lower dose  . Dabigatran Other (See Comments)    Unknown, per pt   . Sulfonamide Derivatives     REACTION: hives    Social History   Socioeconomic History  . Marital status: Widowed    Spouse name: Not on file  . Number of children: Not on file  . Years of education: Not on file  . Highest education level: Not on file  Social Needs  . Financial resource strain: Not on file  . Food insecurity - worry: Not on file  . Food insecurity - inability: Not on file  . Transportation needs - medical: Not on file  . Transportation needs - non-medical: Not on file  Occupational History  . Not on file  Tobacco Use  . Smoking status: Never Smoker  . Smokeless tobacco: Never  Used  Substance and Sexual Activity  . Alcohol use: No  . Drug use: No  . Sexual activity: No  Other Topics Concern  . Not on file  Social History Narrative   She has two children, two grandchildren and three great grandchildren    Family History  Problem Relation Age of Onset  . Heart disease Father     ROS- All systems are reviewed and negative except as per the HPI above  Physical Exam: Vitals:   09/29/17 1414  BP: (!) 144/72  Pulse: 76  Weight: 117 lb (53.1 kg)  Height: 5\' 2"  (1.575 m)    GEN- The patient is well appearing, alert and oriented x 3 today.   Head- normocephalic, atraumatic Eyes-  Sclera clear, conjunctiva pink Ears- hearing intact Oropharynx- clear Neck- supple, no JVP Lymph- no cervical lymphadenopathy Lungs- Clear to ausculation bilaterally, normal work of breathing Heart-irregular rate and rhythm, no murmurs, rubs or gallops, PMI not laterally displaced GI- soft, NT, ND, + BS Extremities- no clubbing, cyanosis, or edema MS- no significant deformity or atrophy Skin- no rash or lesion Psych- euthymic mood, full affect Neuro- strength and sensation are intact  EKG-a fib at 76 bpm, qrs int 78 ms, qtc 510 ms Epic records reviewed lexi myoview-11/13/16- Study Highlights    Nuclear stress EF: 65%.  Baseline EKG showed atrial fibrillation with nonspecific ST abnormality. During the infusion there was 23mm of horizontal to downsloping ST segment depression in the inferolateral leads  There is a small defect of mild severity present in the basal inferoseptal and mid inferoseptal location. The defect is non-reversible and in the setting of normal LVF, likely represents attenuation artifact. No ischemia noted.  This is a low risk study. Nondiagnostic EKG changes given baseline ST abnormality.  The left ventricular ejection fraction is normal (55-65%).   Echo-LV EF: 55% -   60% Study Conclusions  - Left ventricle: The cavity size was normal. There  was mild   concentric hypertrophy. Systolic function was normal. The   estimated ejection fraction was in the range of 55% to 60%. Wall   motion  was normal; there were no regional wall motion   abnormalities. - Aortic valve: Transvalvular velocity was within the normal range.   There was no stenosis. There was trivial regurgitation. - Mitral valve: Mildly calcified annulus. Transvalvular velocity   was within the normal range. There was no evidence for stenosis.   There was trivial regurgitation. - Left atrium: The atrium was severely dilated. - Right ventricle: The cavity size was normal. Wall thickness was   normal. Systolic function was normal. - Right atrium: The atrium was mildly dilated. - Atrial septum: No defect or patent foramen ovale was identified   by color flow Doppler. - Tricuspid valve: There was mild regurgitation. - Pulmonary arteries: Systolic pressure was within the normal   range. PA peak pressure: 32 mm Hg (S).   Assessment and Plan: 1. Longstanding permanent afib/flutter Rate controlled Continue Cardizem and labetalol Continue warfarin for chadsvasc score of at least 4 Cmet, tsh, cbc today  2. HTN Better controlled with limiting salt in diet   3.LLE No appreciable swelling today with reducing slat intake and elevating feet when sitting  F/u in afib clinic in 6 months   Butch Penny C. Analysse Quinonez, [facility] [address] [place], [place] [address] [phone]

## 2017-10-10 DIAGNOSIS — S6991XA Unspecified injury of right wrist, hand and finger(s), initial encounter: Secondary | ICD-10-CM | POA: Diagnosis not present

## 2017-10-10 DIAGNOSIS — R1030 Lower abdominal pain, unspecified: Secondary | ICD-10-CM | POA: Diagnosis not present

## 2017-10-10 DIAGNOSIS — M25551 Pain in right hip: Secondary | ICD-10-CM | POA: Diagnosis not present

## 2017-10-10 DIAGNOSIS — E039 Hypothyroidism, unspecified: Secondary | ICD-10-CM | POA: Diagnosis not present

## 2017-10-10 DIAGNOSIS — S79911A Unspecified injury of right hip, initial encounter: Secondary | ICD-10-CM | POA: Diagnosis not present

## 2017-10-10 DIAGNOSIS — M25531 Pain in right wrist: Secondary | ICD-10-CM | POA: Diagnosis not present

## 2017-10-10 DIAGNOSIS — I4891 Unspecified atrial fibrillation: Secondary | ICD-10-CM | POA: Diagnosis not present

## 2017-10-10 DIAGNOSIS — R2681 Unsteadiness on feet: Secondary | ICD-10-CM | POA: Diagnosis not present

## 2017-10-10 DIAGNOSIS — R102 Pelvic and perineal pain: Secondary | ICD-10-CM | POA: Diagnosis not present

## 2017-10-14 DIAGNOSIS — I4891 Unspecified atrial fibrillation: Secondary | ICD-10-CM | POA: Diagnosis not present

## 2017-10-14 DIAGNOSIS — Z7901 Long term (current) use of anticoagulants: Secondary | ICD-10-CM | POA: Diagnosis not present

## 2017-10-22 DIAGNOSIS — J449 Chronic obstructive pulmonary disease, unspecified: Secondary | ICD-10-CM | POA: Diagnosis not present

## 2017-10-22 DIAGNOSIS — M25551 Pain in right hip: Secondary | ICD-10-CM | POA: Diagnosis not present

## 2017-10-22 DIAGNOSIS — Z7901 Long term (current) use of anticoagulants: Secondary | ICD-10-CM | POA: Diagnosis not present

## 2017-10-22 DIAGNOSIS — H3530 Unspecified macular degeneration: Secondary | ICD-10-CM | POA: Diagnosis not present

## 2017-10-22 DIAGNOSIS — R102 Pelvic and perineal pain: Secondary | ICD-10-CM | POA: Diagnosis not present

## 2017-10-22 DIAGNOSIS — N183 Chronic kidney disease, stage 3 (moderate): Secondary | ICD-10-CM | POA: Diagnosis not present

## 2017-10-22 DIAGNOSIS — I129 Hypertensive chronic kidney disease with stage 1 through stage 4 chronic kidney disease, or unspecified chronic kidney disease: Secondary | ICD-10-CM | POA: Diagnosis not present

## 2017-10-22 DIAGNOSIS — R2681 Unsteadiness on feet: Secondary | ICD-10-CM | POA: Diagnosis not present

## 2017-10-22 DIAGNOSIS — S6991XD Unspecified injury of right wrist, hand and finger(s), subsequent encounter: Secondary | ICD-10-CM | POA: Diagnosis not present

## 2017-10-22 DIAGNOSIS — M810 Age-related osteoporosis without current pathological fracture: Secondary | ICD-10-CM | POA: Diagnosis not present

## 2017-10-22 DIAGNOSIS — W19XXXD Unspecified fall, subsequent encounter: Secondary | ICD-10-CM | POA: Diagnosis not present

## 2017-10-22 DIAGNOSIS — I4891 Unspecified atrial fibrillation: Secondary | ICD-10-CM | POA: Diagnosis not present

## 2017-10-24 DIAGNOSIS — N183 Chronic kidney disease, stage 3 (moderate): Secondary | ICD-10-CM | POA: Diagnosis not present

## 2017-10-24 DIAGNOSIS — E785 Hyperlipidemia, unspecified: Secondary | ICD-10-CM | POA: Diagnosis not present

## 2017-10-24 DIAGNOSIS — E039 Hypothyroidism, unspecified: Secondary | ICD-10-CM | POA: Diagnosis not present

## 2017-10-24 DIAGNOSIS — I129 Hypertensive chronic kidney disease with stage 1 through stage 4 chronic kidney disease, or unspecified chronic kidney disease: Secondary | ICD-10-CM | POA: Diagnosis not present

## 2017-10-24 DIAGNOSIS — M810 Age-related osteoporosis without current pathological fracture: Secondary | ICD-10-CM | POA: Diagnosis not present

## 2017-10-24 DIAGNOSIS — I4891 Unspecified atrial fibrillation: Secondary | ICD-10-CM | POA: Diagnosis not present

## 2017-10-24 DIAGNOSIS — I10 Essential (primary) hypertension: Secondary | ICD-10-CM | POA: Diagnosis not present

## 2017-10-29 DIAGNOSIS — H3530 Unspecified macular degeneration: Secondary | ICD-10-CM | POA: Diagnosis not present

## 2017-10-29 DIAGNOSIS — R102 Pelvic and perineal pain: Secondary | ICD-10-CM | POA: Diagnosis not present

## 2017-10-29 DIAGNOSIS — M25551 Pain in right hip: Secondary | ICD-10-CM | POA: Diagnosis not present

## 2017-10-29 DIAGNOSIS — R2681 Unsteadiness on feet: Secondary | ICD-10-CM | POA: Diagnosis not present

## 2017-10-29 DIAGNOSIS — M810 Age-related osteoporosis without current pathological fracture: Secondary | ICD-10-CM | POA: Diagnosis not present

## 2017-10-29 DIAGNOSIS — S6991XD Unspecified injury of right wrist, hand and finger(s), subsequent encounter: Secondary | ICD-10-CM | POA: Diagnosis not present

## 2017-10-31 DIAGNOSIS — S6991XD Unspecified injury of right wrist, hand and finger(s), subsequent encounter: Secondary | ICD-10-CM | POA: Diagnosis not present

## 2017-10-31 DIAGNOSIS — M810 Age-related osteoporosis without current pathological fracture: Secondary | ICD-10-CM | POA: Diagnosis not present

## 2017-10-31 DIAGNOSIS — M25551 Pain in right hip: Secondary | ICD-10-CM | POA: Diagnosis not present

## 2017-10-31 DIAGNOSIS — H3530 Unspecified macular degeneration: Secondary | ICD-10-CM | POA: Diagnosis not present

## 2017-10-31 DIAGNOSIS — R2681 Unsteadiness on feet: Secondary | ICD-10-CM | POA: Diagnosis not present

## 2017-10-31 DIAGNOSIS — R102 Pelvic and perineal pain: Secondary | ICD-10-CM | POA: Diagnosis not present

## 2017-11-06 DIAGNOSIS — H3530 Unspecified macular degeneration: Secondary | ICD-10-CM | POA: Diagnosis not present

## 2017-11-06 DIAGNOSIS — R102 Pelvic and perineal pain: Secondary | ICD-10-CM | POA: Diagnosis not present

## 2017-11-06 DIAGNOSIS — R2681 Unsteadiness on feet: Secondary | ICD-10-CM | POA: Diagnosis not present

## 2017-11-06 DIAGNOSIS — S6991XD Unspecified injury of right wrist, hand and finger(s), subsequent encounter: Secondary | ICD-10-CM | POA: Diagnosis not present

## 2017-11-06 DIAGNOSIS — M810 Age-related osteoporosis without current pathological fracture: Secondary | ICD-10-CM | POA: Diagnosis not present

## 2017-11-06 DIAGNOSIS — M25551 Pain in right hip: Secondary | ICD-10-CM | POA: Diagnosis not present

## 2017-11-07 DIAGNOSIS — R102 Pelvic and perineal pain: Secondary | ICD-10-CM | POA: Diagnosis not present

## 2017-11-07 DIAGNOSIS — M25551 Pain in right hip: Secondary | ICD-10-CM | POA: Diagnosis not present

## 2017-11-07 DIAGNOSIS — R2681 Unsteadiness on feet: Secondary | ICD-10-CM | POA: Diagnosis not present

## 2017-11-07 DIAGNOSIS — S6991XD Unspecified injury of right wrist, hand and finger(s), subsequent encounter: Secondary | ICD-10-CM | POA: Diagnosis not present

## 2017-11-07 DIAGNOSIS — M810 Age-related osteoporosis without current pathological fracture: Secondary | ICD-10-CM | POA: Diagnosis not present

## 2017-11-07 DIAGNOSIS — H3530 Unspecified macular degeneration: Secondary | ICD-10-CM | POA: Diagnosis not present

## 2017-11-10 DIAGNOSIS — M25551 Pain in right hip: Secondary | ICD-10-CM | POA: Diagnosis not present

## 2017-11-10 DIAGNOSIS — R102 Pelvic and perineal pain: Secondary | ICD-10-CM | POA: Diagnosis not present

## 2017-11-10 DIAGNOSIS — H3530 Unspecified macular degeneration: Secondary | ICD-10-CM | POA: Diagnosis not present

## 2017-11-10 DIAGNOSIS — M810 Age-related osteoporosis without current pathological fracture: Secondary | ICD-10-CM | POA: Diagnosis not present

## 2017-11-10 DIAGNOSIS — S6991XD Unspecified injury of right wrist, hand and finger(s), subsequent encounter: Secondary | ICD-10-CM | POA: Diagnosis not present

## 2017-11-10 DIAGNOSIS — R2681 Unsteadiness on feet: Secondary | ICD-10-CM | POA: Diagnosis not present

## 2017-11-11 DIAGNOSIS — R102 Pelvic and perineal pain: Secondary | ICD-10-CM | POA: Diagnosis not present

## 2017-11-11 DIAGNOSIS — R2681 Unsteadiness on feet: Secondary | ICD-10-CM | POA: Diagnosis not present

## 2017-11-11 DIAGNOSIS — H3530 Unspecified macular degeneration: Secondary | ICD-10-CM | POA: Diagnosis not present

## 2017-11-11 DIAGNOSIS — S6991XD Unspecified injury of right wrist, hand and finger(s), subsequent encounter: Secondary | ICD-10-CM | POA: Diagnosis not present

## 2017-11-11 DIAGNOSIS — M25551 Pain in right hip: Secondary | ICD-10-CM | POA: Diagnosis not present

## 2017-11-11 DIAGNOSIS — M810 Age-related osteoporosis without current pathological fracture: Secondary | ICD-10-CM | POA: Diagnosis not present

## 2017-11-13 ENCOUNTER — Encounter (INDEPENDENT_AMBULATORY_CARE_PROVIDER_SITE_OTHER): Payer: Medicare Other | Admitting: Ophthalmology

## 2017-11-13 DIAGNOSIS — H35033 Hypertensive retinopathy, bilateral: Secondary | ICD-10-CM

## 2017-11-13 DIAGNOSIS — H353211 Exudative age-related macular degeneration, right eye, with active choroidal neovascularization: Secondary | ICD-10-CM

## 2017-11-13 DIAGNOSIS — I10 Essential (primary) hypertension: Secondary | ICD-10-CM | POA: Diagnosis not present

## 2017-11-13 DIAGNOSIS — H353122 Nonexudative age-related macular degeneration, left eye, intermediate dry stage: Secondary | ICD-10-CM

## 2017-11-13 DIAGNOSIS — H43813 Vitreous degeneration, bilateral: Secondary | ICD-10-CM

## 2017-11-25 DIAGNOSIS — Z85828 Personal history of other malignant neoplasm of skin: Secondary | ICD-10-CM | POA: Diagnosis not present

## 2017-11-25 DIAGNOSIS — D0472 Carcinoma in situ of skin of left lower limb, including hip: Secondary | ICD-10-CM | POA: Diagnosis not present

## 2017-11-25 DIAGNOSIS — D485 Neoplasm of uncertain behavior of skin: Secondary | ICD-10-CM | POA: Diagnosis not present

## 2017-11-25 DIAGNOSIS — L570 Actinic keratosis: Secondary | ICD-10-CM | POA: Diagnosis not present

## 2017-11-25 DIAGNOSIS — D225 Melanocytic nevi of trunk: Secondary | ICD-10-CM | POA: Diagnosis not present

## 2017-11-25 DIAGNOSIS — L814 Other melanin hyperpigmentation: Secondary | ICD-10-CM | POA: Diagnosis not present

## 2017-11-25 DIAGNOSIS — L821 Other seborrheic keratosis: Secondary | ICD-10-CM | POA: Diagnosis not present

## 2017-11-25 DIAGNOSIS — D1801 Hemangioma of skin and subcutaneous tissue: Secondary | ICD-10-CM | POA: Diagnosis not present

## 2017-11-26 DIAGNOSIS — H3530 Unspecified macular degeneration: Secondary | ICD-10-CM | POA: Diagnosis not present

## 2017-11-26 DIAGNOSIS — M25551 Pain in right hip: Secondary | ICD-10-CM | POA: Diagnosis not present

## 2017-11-26 DIAGNOSIS — S6991XD Unspecified injury of right wrist, hand and finger(s), subsequent encounter: Secondary | ICD-10-CM | POA: Diagnosis not present

## 2017-11-26 DIAGNOSIS — M810 Age-related osteoporosis without current pathological fracture: Secondary | ICD-10-CM | POA: Diagnosis not present

## 2017-11-26 DIAGNOSIS — R102 Pelvic and perineal pain: Secondary | ICD-10-CM | POA: Diagnosis not present

## 2017-11-26 DIAGNOSIS — R2681 Unsteadiness on feet: Secondary | ICD-10-CM | POA: Diagnosis not present

## 2017-11-27 DIAGNOSIS — Z7901 Long term (current) use of anticoagulants: Secondary | ICD-10-CM | POA: Diagnosis not present

## 2017-11-27 DIAGNOSIS — I4891 Unspecified atrial fibrillation: Secondary | ICD-10-CM | POA: Diagnosis not present

## 2017-12-04 DIAGNOSIS — M810 Age-related osteoporosis without current pathological fracture: Secondary | ICD-10-CM | POA: Diagnosis not present

## 2017-12-04 DIAGNOSIS — M25551 Pain in right hip: Secondary | ICD-10-CM | POA: Diagnosis not present

## 2017-12-04 DIAGNOSIS — H3530 Unspecified macular degeneration: Secondary | ICD-10-CM | POA: Diagnosis not present

## 2017-12-04 DIAGNOSIS — R2681 Unsteadiness on feet: Secondary | ICD-10-CM | POA: Diagnosis not present

## 2017-12-04 DIAGNOSIS — R102 Pelvic and perineal pain: Secondary | ICD-10-CM | POA: Diagnosis not present

## 2017-12-04 DIAGNOSIS — S6991XD Unspecified injury of right wrist, hand and finger(s), subsequent encounter: Secondary | ICD-10-CM | POA: Diagnosis not present

## 2017-12-08 DIAGNOSIS — I10 Essential (primary) hypertension: Secondary | ICD-10-CM | POA: Diagnosis not present

## 2017-12-08 DIAGNOSIS — E039 Hypothyroidism, unspecified: Secondary | ICD-10-CM | POA: Diagnosis not present

## 2017-12-08 DIAGNOSIS — E785 Hyperlipidemia, unspecified: Secondary | ICD-10-CM | POA: Diagnosis not present

## 2017-12-11 DIAGNOSIS — Z7901 Long term (current) use of anticoagulants: Secondary | ICD-10-CM | POA: Diagnosis not present

## 2017-12-11 DIAGNOSIS — I4891 Unspecified atrial fibrillation: Secondary | ICD-10-CM | POA: Diagnosis not present

## 2017-12-15 DIAGNOSIS — I4891 Unspecified atrial fibrillation: Secondary | ICD-10-CM | POA: Diagnosis not present

## 2017-12-15 DIAGNOSIS — I10 Essential (primary) hypertension: Secondary | ICD-10-CM | POA: Diagnosis not present

## 2017-12-15 DIAGNOSIS — M549 Dorsalgia, unspecified: Secondary | ICD-10-CM | POA: Diagnosis not present

## 2017-12-15 DIAGNOSIS — E039 Hypothyroidism, unspecified: Secondary | ICD-10-CM | POA: Diagnosis not present

## 2017-12-17 DIAGNOSIS — D485 Neoplasm of uncertain behavior of skin: Secondary | ICD-10-CM | POA: Diagnosis not present

## 2017-12-17 DIAGNOSIS — D0472 Carcinoma in situ of skin of left lower limb, including hip: Secondary | ICD-10-CM | POA: Diagnosis not present

## 2017-12-17 DIAGNOSIS — L570 Actinic keratosis: Secondary | ICD-10-CM | POA: Diagnosis not present

## 2017-12-25 DIAGNOSIS — R102 Pelvic and perineal pain: Secondary | ICD-10-CM | POA: Diagnosis not present

## 2017-12-25 DIAGNOSIS — R2681 Unsteadiness on feet: Secondary | ICD-10-CM | POA: Diagnosis not present

## 2017-12-25 DIAGNOSIS — M810 Age-related osteoporosis without current pathological fracture: Secondary | ICD-10-CM | POA: Diagnosis not present

## 2017-12-25 DIAGNOSIS — M25551 Pain in right hip: Secondary | ICD-10-CM | POA: Diagnosis not present

## 2018-01-01 ENCOUNTER — Encounter (INDEPENDENT_AMBULATORY_CARE_PROVIDER_SITE_OTHER): Payer: Medicare Other | Admitting: Ophthalmology

## 2018-01-01 DIAGNOSIS — I10 Essential (primary) hypertension: Secondary | ICD-10-CM | POA: Diagnosis not present

## 2018-01-01 DIAGNOSIS — H35033 Hypertensive retinopathy, bilateral: Secondary | ICD-10-CM | POA: Diagnosis not present

## 2018-01-01 DIAGNOSIS — H353122 Nonexudative age-related macular degeneration, left eye, intermediate dry stage: Secondary | ICD-10-CM

## 2018-01-01 DIAGNOSIS — H43813 Vitreous degeneration, bilateral: Secondary | ICD-10-CM | POA: Diagnosis not present

## 2018-01-01 DIAGNOSIS — H353211 Exudative age-related macular degeneration, right eye, with active choroidal neovascularization: Secondary | ICD-10-CM

## 2018-01-13 DIAGNOSIS — L570 Actinic keratosis: Secondary | ICD-10-CM | POA: Diagnosis not present

## 2018-01-22 DIAGNOSIS — I4891 Unspecified atrial fibrillation: Secondary | ICD-10-CM | POA: Diagnosis not present

## 2018-01-22 DIAGNOSIS — Z7901 Long term (current) use of anticoagulants: Secondary | ICD-10-CM | POA: Diagnosis not present

## 2018-01-28 ENCOUNTER — Encounter (INDEPENDENT_AMBULATORY_CARE_PROVIDER_SITE_OTHER): Payer: Medicare Other | Admitting: Ophthalmology

## 2018-01-28 DIAGNOSIS — H353122 Nonexudative age-related macular degeneration, left eye, intermediate dry stage: Secondary | ICD-10-CM

## 2018-01-28 DIAGNOSIS — H35033 Hypertensive retinopathy, bilateral: Secondary | ICD-10-CM | POA: Diagnosis not present

## 2018-01-28 DIAGNOSIS — H353211 Exudative age-related macular degeneration, right eye, with active choroidal neovascularization: Secondary | ICD-10-CM

## 2018-01-28 DIAGNOSIS — I10 Essential (primary) hypertension: Secondary | ICD-10-CM | POA: Diagnosis not present

## 2018-01-28 DIAGNOSIS — H43813 Vitreous degeneration, bilateral: Secondary | ICD-10-CM | POA: Diagnosis not present

## 2018-02-13 ENCOUNTER — Encounter (INDEPENDENT_AMBULATORY_CARE_PROVIDER_SITE_OTHER): Payer: Medicare Other | Admitting: Ophthalmology

## 2018-02-13 DIAGNOSIS — I10 Essential (primary) hypertension: Secondary | ICD-10-CM | POA: Diagnosis not present

## 2018-02-13 DIAGNOSIS — H43813 Vitreous degeneration, bilateral: Secondary | ICD-10-CM | POA: Diagnosis not present

## 2018-02-13 DIAGNOSIS — H353122 Nonexudative age-related macular degeneration, left eye, intermediate dry stage: Secondary | ICD-10-CM | POA: Diagnosis not present

## 2018-02-13 DIAGNOSIS — H35033 Hypertensive retinopathy, bilateral: Secondary | ICD-10-CM | POA: Diagnosis not present

## 2018-02-13 DIAGNOSIS — H353211 Exudative age-related macular degeneration, right eye, with active choroidal neovascularization: Secondary | ICD-10-CM | POA: Diagnosis not present

## 2018-02-17 DIAGNOSIS — M5440 Lumbago with sciatica, unspecified side: Secondary | ICD-10-CM | POA: Diagnosis not present

## 2018-02-17 DIAGNOSIS — M545 Low back pain: Secondary | ICD-10-CM | POA: Diagnosis not present

## 2018-02-17 DIAGNOSIS — E039 Hypothyroidism, unspecified: Secondary | ICD-10-CM | POA: Diagnosis not present

## 2018-02-17 DIAGNOSIS — I4891 Unspecified atrial fibrillation: Secondary | ICD-10-CM | POA: Diagnosis not present

## 2018-02-17 DIAGNOSIS — I10 Essential (primary) hypertension: Secondary | ICD-10-CM | POA: Diagnosis not present

## 2018-03-03 ENCOUNTER — Encounter (INDEPENDENT_AMBULATORY_CARE_PROVIDER_SITE_OTHER): Payer: Medicare Other | Admitting: Ophthalmology

## 2018-03-03 DIAGNOSIS — H353122 Nonexudative age-related macular degeneration, left eye, intermediate dry stage: Secondary | ICD-10-CM | POA: Diagnosis not present

## 2018-03-03 DIAGNOSIS — H43813 Vitreous degeneration, bilateral: Secondary | ICD-10-CM

## 2018-03-03 DIAGNOSIS — I10 Essential (primary) hypertension: Secondary | ICD-10-CM

## 2018-03-03 DIAGNOSIS — H35033 Hypertensive retinopathy, bilateral: Secondary | ICD-10-CM

## 2018-03-03 DIAGNOSIS — H353211 Exudative age-related macular degeneration, right eye, with active choroidal neovascularization: Secondary | ICD-10-CM | POA: Diagnosis not present

## 2018-03-04 ENCOUNTER — Other Ambulatory Visit (HOSPITAL_COMMUNITY): Payer: Self-pay | Admitting: *Deleted

## 2018-03-04 MED ORDER — DILTIAZEM HCL 30 MG PO TABS: ORAL_TABLET | ORAL | 1 refills | Status: DC

## 2018-03-16 ENCOUNTER — Encounter (INDEPENDENT_AMBULATORY_CARE_PROVIDER_SITE_OTHER): Payer: Medicare Other | Admitting: Ophthalmology

## 2018-03-16 DIAGNOSIS — H353211 Exudative age-related macular degeneration, right eye, with active choroidal neovascularization: Secondary | ICD-10-CM | POA: Diagnosis not present

## 2018-04-07 ENCOUNTER — Ambulatory Visit (HOSPITAL_COMMUNITY)
Admission: RE | Admit: 2018-04-07 | Discharge: 2018-04-07 | Disposition: A | Discharge status: Home / Self Care | Payer: Medicare Other | Source: Ambulatory Visit | Attending: Nurse Practitioner | Admitting: Nurse Practitioner

## 2018-04-07 ENCOUNTER — Encounter (HOSPITAL_COMMUNITY): Payer: Self-pay | Admitting: Nurse Practitioner

## 2018-04-07 VITALS — BP 168/76 | HR 82 | Ht 62.0 in | Wt 115.0 lb

## 2018-04-07 DIAGNOSIS — Z85828 Personal history of other malignant neoplasm of skin: Secondary | ICD-10-CM | POA: Diagnosis not present

## 2018-04-07 DIAGNOSIS — Z882 Allergy status to sulfonamides status: Secondary | ICD-10-CM | POA: Diagnosis not present

## 2018-04-07 DIAGNOSIS — K219 Gastro-esophageal reflux disease without esophagitis: Secondary | ICD-10-CM | POA: Insufficient documentation

## 2018-04-07 DIAGNOSIS — E785 Hyperlipidemia, unspecified: Secondary | ICD-10-CM | POA: Insufficient documentation

## 2018-04-07 DIAGNOSIS — F419 Anxiety disorder, unspecified: Secondary | ICD-10-CM | POA: Insufficient documentation

## 2018-04-07 DIAGNOSIS — I482 Chronic atrial fibrillation, unspecified: Secondary | ICD-10-CM

## 2018-04-07 DIAGNOSIS — I481 Persistent atrial fibrillation: Secondary | ICD-10-CM | POA: Insufficient documentation

## 2018-04-07 DIAGNOSIS — R9431 Abnormal electrocardiogram [ECG] [EKG]: Secondary | ICD-10-CM | POA: Insufficient documentation

## 2018-04-07 DIAGNOSIS — Z7901 Long term (current) use of anticoagulants: Secondary | ICD-10-CM | POA: Insufficient documentation

## 2018-04-07 DIAGNOSIS — I4892 Unspecified atrial flutter: Secondary | ICD-10-CM | POA: Insufficient documentation

## 2018-04-07 DIAGNOSIS — K449 Diaphragmatic hernia without obstruction or gangrene: Secondary | ICD-10-CM | POA: Insufficient documentation

## 2018-04-07 DIAGNOSIS — E039 Hypothyroidism, unspecified: Secondary | ICD-10-CM | POA: Diagnosis not present

## 2018-04-07 DIAGNOSIS — I10 Essential (primary) hypertension: Secondary | ICD-10-CM | POA: Insufficient documentation

## 2018-04-07 DIAGNOSIS — Z79899 Other long term (current) drug therapy: Secondary | ICD-10-CM | POA: Diagnosis not present

## 2018-04-07 DIAGNOSIS — I4821 Permanent atrial fibrillation: Secondary | ICD-10-CM

## 2018-04-07 MED ORDER — RIVAROXABAN 15 MG PO TABS: 15.0000 mg | ORAL_TABLET | Freq: Every day | ORAL | 6 refills | Status: DC

## 2018-04-07 NOTE — Progress Notes (Signed)
Patient ID: [name], female   DOB: October 07, 1924, 84 y.o.   MRN: [id number]     Primary Care Physician: Jani Gravel, MD Referring Physician: Dr. Purvis Sheffield Nappier is a 84 y.o. female with a h/o persistent afib/flutter tht is currently being rate controlled having failed tikosyn/amiodarone and two cardioversion last fall/winter. She saw Dr. Caryl Comes last end of March and her labetalol was increased for HR and BP. She asked to be seen today for 3 concerns, persistent H/A's for months, shooting pains around her rt knee and feeling fullness in her chest when her heart rate gets up around 100 usually just for a few minutes. Overall, her heart rate is much improved from earlier in year. BP is well managed.She will feel her heart rate usually first thing in am before she takes her meds. She states that her HA's were present at the time she saw Dr. Caryl Comes in March and she forgot to mention, but they have become more frequent, usually several times a week and they are improved with Tylenol. She had negative CT of head 1/17. No falls, no signs of neuro deficients with H/A. She has never had H/A's and she is puzzled why they are occurring.No other change in medications. She is pending appointment with her primary next week. Her knee is slightly tender to touch but exam is unremarkable. She continues on warfarin.  Returns to [place] clinic 10/24, she is living in persistent afib/flutter since last year failing tikosyn/amiodarone/cardioversions. She feels that she is doing well. HR is controlled and BP at home is controlled. BP is office today is elevated. She is doing her usual activities without symptoms. Continues on warfarin but discovered last week, that coumadin  had been left out of her pre poured meds. She was seen in the coumadin clinic this am and with a INR of 1.2 and her dose was adjusted with recheck in one week.She will rarely take an extra 1/2 tab of short acting cardizem.  Returns to [place] clinic  10/23/16 for some issues. She has noted some tightness of her chest at times that she feels is a new symptom over the last 3-4 weeks. She has also been dx and tx for a UTI during this period of time as well. The chest tightness is not necessarily  tied to exertion but will more likely to feel it  if her heart rate increases. Review of her heart rates at home show good rate control. Highest heart rate non sustained noted was 103 bpm.  F/u in afib clinic 02/26/17. She is being seen as her daughter called to the office yesterday for weakness, dizziness, off balance, chills, chest pain.[name] She was told to go to the ER if symptoms worsened but daughter wanted an appointment today. She had a stress test a few months ago and was low risk. She has had intermittent chest discomfort now for several months. Does not seem to be exertional and she points to the epigastric area as source of discomfort.This has now resolved. The onset of dizziness yesterday was present when she woke up and improved yesterday afternoon with dramamine. Today, she is weak. No further chills. Specimen of urine was sent to PCP and was clean. The dizziness is better today as well. No shortness of breath. She has chronic atrial flutter and the rate is controlled.Contoinues on warfarin. No obvious bleeding.  F/u afib clinic 09/29/17. She called last week wanting to be seen for elevated BP and swollen ankles.At that  time, over the phone, she was eating a lot of canned soup and cheese and dangling her feet watching TV. She was asked to elevate feet when sitting and reduce salt intake. She has done these things and today, BP is stable at 144/72 and her pedal edema has resolved. She feels her afib sometime, but for the most part, it is well rate controlled.  F/u in afib clinic 9/10. She reports that she is doing fairly well. He has rate controlled afib. She feels sometime at night, will take an extra 1/2 tab Cardizem. Her BP readings from home are reviewed and  they are mostly in the 629'U systolic, occasional one in the 150-160 range. If she feels BP is too high at home, she will take 1/2 tab of the Cardizem as well. Since I saw her last she was been placed on xarelto 20 mg a day and taken off coumadin but by her crcl cal at 22.52 indicates that she needs to be on the lower dose of xarelto at 15 mg daily.  Today, she denies symptoms of palpitations, chest pain, shortness of breath, orthopnea, PND, lower extremity edema , presyncope, syncope, or neurologic sequela. Positive for weakness, dizziness yesterday has resolved. The patient is tolerating medications without difficulties and is otherwise without complaint today.   Past Medical History:  Diagnosis Date  . Anxiety   . Arthritis    "back; hands" (01/13/2015)  . First degree AV block   . Gastroesophageal reflux disease   . History of hiatal hernia   . Hx of echocardiogram    echo 07/2009:  mild focal basal septal hypertrophy, EF 55-60%, mild MR, mod LAE, mild RAE, PASP 45  . Hyperlipidemia   . Hypertension   . Hypothyroidism   . Persistent atrial fibrillation ([place])       . Seasonal allergies   . Squamous carcinoma 2016   "right leg; right jawline"  . Squamous cell skin cancer   . SVT (supraventricular tachycardia) (HCC)    possible atrial tachycardia, possible junctional tacycardia   Past Surgical History:  Procedure Laterality Date  . ABDOMINAL HYSTERECTOMY  ~ 1975  . APPENDECTOMY  ~ 1975  . CARDIAC CATHETERIZATION  1990's  . CARDIOVERSION  01/13/2015   "didn't work"  . CARDIOVERSION N/A 01/13/2015   Procedure: CARDIOVERSION;  Surgeon: Josue Hector, MD;  Location: [facility] ENDOSCOPY;  Service: Cardiovascular;  Laterality: N/A;  . CARDIOVERSION N/A 06/21/2015   Procedure: CARDIOVERSION;  Surgeon: Larey Dresser, MD;  Location: [place];  Service: Cardiovascular;  Laterality: N/A;  . CATARACT EXTRACTION W/ INTRAOCULAR LENS  IMPLANT, BILATERAL Bilateral 2011  . DILATION AND CURETTAGE OF  UTERUS    . ELECTROPHYSIOLOGIC STUDY N/A 07/17/2015   Procedure: Cardioversion;  Surgeon: Deboraha Sprang, MD;  Location: [place] CV LAB;  Service: Cardiovascular;  Laterality: N/A;  . KNEE ARTHROSCOPY Right 1990's  . LAPAROSCOPIC BILATERAL SALPINGO OOPHERECTOMY Bilateral ~ 2004  . SQUAMOUS CELL CARCINOMA EXCISION  2016   "right leg; right jawline"    Current Outpatient Medications  Medication Sig Dispense Refill  . acetaminophen (TYLENOL) 500 MG tablet Take 500 mg by mouth every 8 (eight) hours as needed for mild pain or moderate pain.    . calcium carbonate (TUMS - DOSED IN MG ELEMENTAL CALCIUM) 500 MG chewable tablet Chew 1 tablet by mouth daily as needed for indigestion or heartburn.    . cholecalciferol (VITAMIN D) 1000 UNITS tablet Take 1,000 Units by mouth daily.    [name]  diltiazem (CARDIZEM CD) 180 MG 24 hr capsule Take 180 mg by mouth daily.     [name] diltiazem (CARDIZEM) 30 MG tablet Take 1/2 - 1 tablet every 4 hours AS NEEDED for heart rate >100 as long as blood pressure >100 30 tablet 1  . INCRUSE ELLIPTA 62.5 MCG/INH AEPB TAKE 1 PUFF BY MOUTH EVERY DAY  5  . labetalol (NORMODYNE) 100 MG tablet Take 100 mg by mouth 2 (two) times daily. Take an extra half tablet in the morning    . levothyroxine (SYNTHROID, LEVOTHROID) 50 MCG tablet Take 50 mcg by mouth daily.      . Melatonin 3 MG TABS Take 1 tablet by mouth at bedtime.    . Multiple Vitamins-Minerals (VITEYES AREDS FORMULA/LUTEIN) CAPS Take 1 capsule by mouth 2 (two) times daily.     [name] omeprazole (PRILOSEC) 40 MG capsule Take by mouth daily.  1  . rivaroxaban (XARELTO) 15 MG TABS tablet Take 1 tablet (15 mg total) by mouth daily with supper. 30 tablet 6   No current facility-administered medications for this encounter.     Allergies  Allergen Reactions  . Amiodarone Nausea And Vomiting    Only at higher dose.  Able to tolerate lower dose  . Dabigatran Other (See Comments)    Unknown, per pt   . Sulfonamide Derivatives      REACTION: hives    Social History   Socioeconomic History  . Marital status: Widowed    Spouse name: Not on file  . Number of children: Not on file  . Years of education: Not on file  . Highest education level: Not on file  Occupational History  . Not on file  Social Needs  . Financial resource strain: Not on file  . Food insecurity:    Worry: Not on file    Inability: Not on file  . Transportation needs:    Medical: Not on file    Non-medical: Not on file  Tobacco Use  . Smoking status: Never Smoker  . Smokeless tobacco: Never Used  Substance and Sexual Activity  . Alcohol use: No  . Drug use: No  . Sexual activity: Never  Lifestyle  . Physical activity:    Days per week: Not on file    Minutes per session: Not on file  . Stress: Not on file  Relationships  . Social connections:    Talks on phone: Not on file    Gets together: Not on file    Attends religious service: Not on file    Active member of club or organization: Not on file    Attends meetings of clubs or organizations: Not on file    Relationship status: Not on file  . Intimate partner violence:    Fear of current or ex partner: Not on file    Emotionally abused: Not on file    Physically abused: Not on file    Forced sexual activity: Not on file  Other Topics Concern  . Not on file  Social History Narrative   She has two children, two grandchildren and three great grandchildren    Family History  Problem Relation Age of Onset  . Heart disease Father     ROS- All systems are reviewed and negative except as per the HPI above  Physical Exam: Vitals:   04/07/18 1440  BP: (!) 168/76  Pulse: 82  Weight: 52.2 kg  Height: 5\' 2"  (1.575 m)    GEN- The patient is well  appearing, alert and oriented x 3 today.   Head- normocephalic, atraumatic Eyes-  Sclera clear, conjunctiva pink Ears- hearing intact Oropharynx- clear Neck- supple, no JVP Lymph- no cervical lymphadenopathy Lungs- Clear to  ausculation bilaterally, normal work of breathing Heart-irregular rate and rhythm, no murmurs, rubs or gallops, PMI not laterally displaced GI- soft, NT, ND, + BS Extremities- no clubbing, cyanosis, or edema MS- no significant deformity or atrophy Skin- no rash or lesion Psych- euthymic mood, full affect Neuro- strength and sensation are intact  EKG-a fib at 82 bpm, qrs int 76 ms, qtc 450 ms Epic records reviewed lexi myoview-11/13/16- Study Highlights    Nuclear stress EF: 65%.  Baseline EKG showed atrial fibrillation with nonspecific ST abnormality. During the infusion there was 41mm of horizontal to downsloping ST segment depression in the inferolateral leads  There is a small defect of mild severity present in the basal inferoseptal and mid inferoseptal location. The defect is non-reversible and in the setting of normal LVF, likely represents attenuation artifact. No ischemia noted.  This is a low risk study. Nondiagnostic EKG changes given baseline ST abnormality.  The left ventricular ejection fraction is normal (55-65%).   Echo-LV EF: 55% -   60% Study Conclusions  - Left ventricle: The cavity size was normal. There was mild   concentric hypertrophy. Systolic function was normal. The   estimated ejection fraction was in the range of 55% to 60%. Wall   motion was normal; there were no regional wall motion   abnormalities. - Aortic valve: Transvalvular velocity was within the normal range.   There was no stenosis. There was trivial regurgitation. - Mitral valve: Mildly calcified annulus. Transvalvular velocity   was within the normal range. There was no evidence for stenosis.   There was trivial regurgitation. - Left atrium: The atrium was severely dilated. - Right ventricle: The cavity size was normal. Wall thickness was   normal. Systolic function was normal. - Right atrium: The atrium was mildly dilated. - Atrial septum: No defect or patent foramen ovale was  identified   by color flow Doppler. - Tricuspid valve: There was mild regurgitation. - Pulmonary arteries: Systolic pressure was within the normal   range. PA peak pressure: 32 mm Hg (S).   Assessment and Plan: 1. Longstanding permanent afib/flutter Rate controlled Continue Cardizem and labetalol Continue xarelto but at 15 mg daily, more appropriate dose for her age, weight and creatinine for chadsvasc score of at least 4 New rx given today  2. HTN Overall, is stable, appropriate for age Uses 1/2 tab of 30 mg if systolic BP is above 161 mg systolic Avoid salt  3.LLE No appreciable swelling today   F/u in afib clinic in 6 months   Butch Penny C. Danzel Marszalek, [facility] [address] [place], [place] [address] [phone]

## 2018-04-07 NOTE — Patient Instructions (Signed)
Xarelto 15mg  once a day

## 2018-04-10 ENCOUNTER — Encounter (INDEPENDENT_AMBULATORY_CARE_PROVIDER_SITE_OTHER): Payer: Medicare Other | Admitting: Ophthalmology

## 2018-04-10 DIAGNOSIS — H35033 Hypertensive retinopathy, bilateral: Secondary | ICD-10-CM

## 2018-04-10 DIAGNOSIS — H353122 Nonexudative age-related macular degeneration, left eye, intermediate dry stage: Secondary | ICD-10-CM | POA: Diagnosis not present

## 2018-04-10 DIAGNOSIS — H353211 Exudative age-related macular degeneration, right eye, with active choroidal neovascularization: Secondary | ICD-10-CM | POA: Diagnosis not present

## 2018-04-10 DIAGNOSIS — I10 Essential (primary) hypertension: Secondary | ICD-10-CM | POA: Diagnosis not present

## 2018-04-10 DIAGNOSIS — H43813 Vitreous degeneration, bilateral: Secondary | ICD-10-CM | POA: Diagnosis not present

## 2018-04-13 ENCOUNTER — Encounter (INDEPENDENT_AMBULATORY_CARE_PROVIDER_SITE_OTHER): Payer: Medicare Other | Admitting: Ophthalmology

## 2018-05-06 ENCOUNTER — Encounter (INDEPENDENT_AMBULATORY_CARE_PROVIDER_SITE_OTHER): Payer: Medicare Other | Admitting: Ophthalmology

## 2018-05-06 DIAGNOSIS — I10 Essential (primary) hypertension: Secondary | ICD-10-CM

## 2018-05-06 DIAGNOSIS — H353211 Exudative age-related macular degeneration, right eye, with active choroidal neovascularization: Secondary | ICD-10-CM

## 2018-05-06 DIAGNOSIS — H35033 Hypertensive retinopathy, bilateral: Secondary | ICD-10-CM

## 2018-05-06 DIAGNOSIS — H353122 Nonexudative age-related macular degeneration, left eye, intermediate dry stage: Secondary | ICD-10-CM

## 2018-06-03 ENCOUNTER — Encounter (INDEPENDENT_AMBULATORY_CARE_PROVIDER_SITE_OTHER): Payer: Medicare Other | Admitting: Ophthalmology

## 2018-06-03 DIAGNOSIS — H43813 Vitreous degeneration, bilateral: Secondary | ICD-10-CM | POA: Diagnosis not present

## 2018-06-03 DIAGNOSIS — H353211 Exudative age-related macular degeneration, right eye, with active choroidal neovascularization: Secondary | ICD-10-CM | POA: Diagnosis not present

## 2018-06-03 DIAGNOSIS — I10 Essential (primary) hypertension: Secondary | ICD-10-CM | POA: Diagnosis not present

## 2018-06-03 DIAGNOSIS — H35033 Hypertensive retinopathy, bilateral: Secondary | ICD-10-CM | POA: Diagnosis not present

## 2018-06-03 DIAGNOSIS — H353122 Nonexudative age-related macular degeneration, left eye, intermediate dry stage: Secondary | ICD-10-CM

## 2018-06-12 DIAGNOSIS — E039 Hypothyroidism, unspecified: Secondary | ICD-10-CM | POA: Diagnosis not present

## 2018-06-12 DIAGNOSIS — I10 Essential (primary) hypertension: Secondary | ICD-10-CM | POA: Diagnosis not present

## 2018-06-19 DIAGNOSIS — Z0000 Encounter for general adult medical examination without abnormal findings: Secondary | ICD-10-CM | POA: Diagnosis not present

## 2018-06-19 DIAGNOSIS — K219 Gastro-esophageal reflux disease without esophagitis: Secondary | ICD-10-CM | POA: Diagnosis not present

## 2018-06-19 DIAGNOSIS — Z7901 Long term (current) use of anticoagulants: Secondary | ICD-10-CM | POA: Diagnosis not present

## 2018-06-19 DIAGNOSIS — Z23 Encounter for immunization: Secondary | ICD-10-CM | POA: Diagnosis not present

## 2018-06-19 DIAGNOSIS — E039 Hypothyroidism, unspecified: Secondary | ICD-10-CM | POA: Diagnosis not present

## 2018-06-19 DIAGNOSIS — I4891 Unspecified atrial fibrillation: Secondary | ICD-10-CM | POA: Diagnosis not present

## 2018-07-01 ENCOUNTER — Encounter (INDEPENDENT_AMBULATORY_CARE_PROVIDER_SITE_OTHER): Payer: Medicare Other | Admitting: Ophthalmology

## 2018-07-09 ENCOUNTER — Encounter (INDEPENDENT_AMBULATORY_CARE_PROVIDER_SITE_OTHER): Payer: Medicare Other | Admitting: Ophthalmology

## 2018-07-13 ENCOUNTER — Encounter (INDEPENDENT_AMBULATORY_CARE_PROVIDER_SITE_OTHER): Payer: Medicare Other | Admitting: Ophthalmology

## 2018-07-14 ENCOUNTER — Encounter (INDEPENDENT_AMBULATORY_CARE_PROVIDER_SITE_OTHER): Payer: Medicare Other | Admitting: Ophthalmology

## 2018-07-14 DIAGNOSIS — I10 Essential (primary) hypertension: Secondary | ICD-10-CM | POA: Diagnosis not present

## 2018-07-14 DIAGNOSIS — H353122 Nonexudative age-related macular degeneration, left eye, intermediate dry stage: Secondary | ICD-10-CM | POA: Diagnosis not present

## 2018-07-14 DIAGNOSIS — H35033 Hypertensive retinopathy, bilateral: Secondary | ICD-10-CM | POA: Diagnosis not present

## 2018-07-14 DIAGNOSIS — H43813 Vitreous degeneration, bilateral: Secondary | ICD-10-CM | POA: Diagnosis not present

## 2018-07-14 DIAGNOSIS — H353211 Exudative age-related macular degeneration, right eye, with active choroidal neovascularization: Secondary | ICD-10-CM

## 2018-08-05 DIAGNOSIS — D485 Neoplasm of uncertain behavior of skin: Secondary | ICD-10-CM | POA: Diagnosis not present

## 2018-08-05 DIAGNOSIS — C44729 Squamous cell carcinoma of skin of left lower limb, including hip: Secondary | ICD-10-CM | POA: Diagnosis not present

## 2018-08-25 ENCOUNTER — Encounter (INDEPENDENT_AMBULATORY_CARE_PROVIDER_SITE_OTHER): Payer: Medicare Other | Admitting: Ophthalmology

## 2018-08-25 DIAGNOSIS — H35033 Hypertensive retinopathy, bilateral: Secondary | ICD-10-CM | POA: Diagnosis not present

## 2018-08-25 DIAGNOSIS — H353211 Exudative age-related macular degeneration, right eye, with active choroidal neovascularization: Secondary | ICD-10-CM | POA: Diagnosis not present

## 2018-08-25 DIAGNOSIS — I10 Essential (primary) hypertension: Secondary | ICD-10-CM

## 2018-08-25 DIAGNOSIS — H353122 Nonexudative age-related macular degeneration, left eye, intermediate dry stage: Secondary | ICD-10-CM | POA: Diagnosis not present

## 2018-08-25 DIAGNOSIS — H43813 Vitreous degeneration, bilateral: Secondary | ICD-10-CM | POA: Diagnosis not present

## 2018-09-02 DIAGNOSIS — C44729 Squamous cell carcinoma of skin of left lower limb, including hip: Secondary | ICD-10-CM | POA: Diagnosis not present

## 2018-09-09 ENCOUNTER — Encounter (HOSPITAL_COMMUNITY): Payer: Self-pay | Admitting: *Deleted

## 2018-09-24 ENCOUNTER — Ambulatory Visit (HOSPITAL_COMMUNITY): Payer: Medicare Other | Admitting: Nurse Practitioner

## 2018-10-02 ENCOUNTER — Encounter (INDEPENDENT_AMBULATORY_CARE_PROVIDER_SITE_OTHER): Payer: Medicare Other | Admitting: Ophthalmology

## 2018-10-02 DIAGNOSIS — H353122 Nonexudative age-related macular degeneration, left eye, intermediate dry stage: Secondary | ICD-10-CM

## 2018-10-02 DIAGNOSIS — I10 Essential (primary) hypertension: Secondary | ICD-10-CM

## 2018-10-02 DIAGNOSIS — H35033 Hypertensive retinopathy, bilateral: Secondary | ICD-10-CM

## 2018-10-02 DIAGNOSIS — H353211 Exudative age-related macular degeneration, right eye, with active choroidal neovascularization: Secondary | ICD-10-CM | POA: Diagnosis not present

## 2018-10-02 DIAGNOSIS — H43813 Vitreous degeneration, bilateral: Secondary | ICD-10-CM | POA: Diagnosis not present

## 2018-10-07 ENCOUNTER — Ambulatory Visit (HOSPITAL_COMMUNITY): Payer: Medicare Other | Admitting: Nurse Practitioner

## 2018-10-09 ENCOUNTER — Ambulatory Visit (HOSPITAL_COMMUNITY)
Admission: RE | Admit: 2018-10-09 | Discharge: 2018-10-09 | Disposition: A | Discharge status: Home / Self Care | Payer: Medicare Other | Source: Ambulatory Visit | Attending: Nurse Practitioner | Admitting: Nurse Practitioner

## 2018-10-09 ENCOUNTER — Encounter (HOSPITAL_COMMUNITY): Payer: Self-pay | Admitting: Nurse Practitioner

## 2018-10-09 ENCOUNTER — Other Ambulatory Visit: Payer: Self-pay

## 2018-10-09 VITALS — BP 150/76 | HR 64 | Ht 62.0 in | Wt 113.0 lb

## 2018-10-09 DIAGNOSIS — Z888 Allergy status to other drugs, medicaments and biological substances status: Secondary | ICD-10-CM | POA: Insufficient documentation

## 2018-10-09 DIAGNOSIS — I4821 Permanent atrial fibrillation: Secondary | ICD-10-CM | POA: Insufficient documentation

## 2018-10-09 DIAGNOSIS — R9431 Abnormal electrocardiogram [ECG] [EKG]: Secondary | ICD-10-CM | POA: Insufficient documentation

## 2018-10-09 DIAGNOSIS — K219 Gastro-esophageal reflux disease without esophagitis: Secondary | ICD-10-CM | POA: Insufficient documentation

## 2018-10-09 DIAGNOSIS — Z7901 Long term (current) use of anticoagulants: Secondary | ICD-10-CM | POA: Diagnosis not present

## 2018-10-09 DIAGNOSIS — C44329 Squamous cell carcinoma of skin of other parts of face: Secondary | ICD-10-CM | POA: Insufficient documentation

## 2018-10-09 DIAGNOSIS — E039 Hypothyroidism, unspecified: Secondary | ICD-10-CM | POA: Insufficient documentation

## 2018-10-09 DIAGNOSIS — Z79899 Other long term (current) drug therapy: Secondary | ICD-10-CM | POA: Diagnosis not present

## 2018-10-09 DIAGNOSIS — Z8249 Family history of ischemic heart disease and other diseases of the circulatory system: Secondary | ICD-10-CM | POA: Diagnosis not present

## 2018-10-09 DIAGNOSIS — Z882 Allergy status to sulfonamides status: Secondary | ICD-10-CM | POA: Diagnosis not present

## 2018-10-09 DIAGNOSIS — Z885 Allergy status to narcotic agent status: Secondary | ICD-10-CM | POA: Diagnosis not present

## 2018-10-09 DIAGNOSIS — Z79890 Hormone replacement therapy (postmenopausal): Secondary | ICD-10-CM | POA: Diagnosis not present

## 2018-10-09 DIAGNOSIS — I4892 Unspecified atrial flutter: Secondary | ICD-10-CM | POA: Diagnosis not present

## 2018-10-09 DIAGNOSIS — I10 Essential (primary) hypertension: Secondary | ICD-10-CM | POA: Diagnosis not present

## 2018-10-09 DIAGNOSIS — C44722 Squamous cell carcinoma of skin of right lower limb, including hip: Secondary | ICD-10-CM | POA: Diagnosis not present

## 2018-10-09 NOTE — Progress Notes (Addendum)
Patient ID: [name], female   DOB: October 07, 1924, 85 y.o.   MRN: [id number]     Primary Care Physician: Jani Gravel, MD Referring Physician: Dr. Purvis Sheffield [name] is a 85 y.o. female with a h/o persistent afib/flutter tht is currently being rate controlled having failed tikosyn/amiodarone and two cardioversion last fall/winter. She saw Dr. Caryl Comes last end of March and her labetalol was increased for HR and BP. She asked to be seen today for 3 concerns, persistent H/A's for months, shooting pains around her rt knee and feeling fullness in her chest when her heart rate gets up around 100 usually just for a few minutes. Overall, her heart rate is much improved from earlier in year. BP is well managed.She will feel her heart rate usually first thing in am before she takes her meds. She states that her HA's were present at the time she saw Dr. Caryl Comes in March and she forgot to mention, but they have become more frequent, usually several times a week and they are improved with Tylenol. She had negative CT of head 1/17. No falls, no signs of neuro deficients with H/A. She has never had H/A's and she is puzzled why they are occurring.No other change in medications. She is pending appointment with her primary next week. Her knee is slightly tender to touch but exam is unremarkable. She continues on warfarin.  [facility] 10/24, she is living in persistent afib/flutter since last year failing tikosyn/amiodarone/cardioversions. She feels that she is doing well. HR is controlled and BP at home is controlled. BP is office today is elevated. She is doing her usual activities without symptoms. Continues on warfarin but discovered last week, that coumadin  had been left out of her pre poured meds. She was seen in the coumadin clinic this am and with a INR of 1.2 and her dose was adjusted with recheck in one week.She will rarely take an extra 1/2 tab of short acting cardizem.  Returns to [facility]  10/23/16 for some issues. She has noted some tightness of her chest at times that she feels is a new symptom over the last 3-4 weeks. She has also been dx and tx for a UTI during this period of time as well. The chest tightness is not necessarily  tied to exertion but will more likely to feel it  if her heart rate increases. Review of her heart rates at home show good rate control. Highest heart rate non sustained noted was 103 bpm.  F/u in afib clinic 02/26/17. She is being seen as her daughter called to the office yesterday for weakness, dizziness, off balance, chills, chest pain.[name] She was told to go to the ER if symptoms worsened but daughter wanted an appointment today. She had a stress test a few months ago and was low risk. She has had intermittent chest discomfort now for several months. Does not seem to be exertional and she points to the epigastric area as source of discomfort.This has now resolved. The onset of dizziness yesterday was present when she woke up and improved yesterday afternoon with dramamine. Today, she is weak. No further chills. Specimen of urine was sent to PCP and was clean. The dizziness is better today as well. No shortness of breath. She has chronic atrial flutter and the rate is controlled.Contoinues on warfarin. No obvious bleeding.  F/u afib clinic 09/29/17. She called last week wanting to be seen for elevated BP and swollen ankles.At that  time, over the phone, she was eating a lot of canned soup and cheese and dangling her feet watching TV. She was asked to elevate feet when sitting and reduce salt intake. She has done these things and today, BP is stable at 144/72 and her pedal edema has resolved. She feels her afib sometime, but for the most part, it is well rate controlled.  F/u in afib clinic 9/10. She reports that she is doing fairly well. He has rate controlled afib. She feels sometime at night, will take an extra 1/2 tab Cardizem. Her BP readings from home are reviewed and  they are mostly in the 409'W systolic, occasional one in the 150-160 range. If she feels BP is too high at home, she will take 1/2 tab of the Cardizem as well. Since I saw her last she was been placed on xarelto 20 mg a day and taken off coumadin but by her crcl cal at 22.52 indicates that she needs to be on the lower dose of xarelto at 15 mg daily.  F/u in afib clinic 10/09/18. She feels that she is tolerating afib/flutter well. No current c/o's. Rate controlled. Currently in aflutter at 35 bpm.  Today, she denies symptoms of palpitations, chest pain, shortness of breath, orthopnea, PND, lower extremity edema , presyncope, syncope, or neurologic sequela. Positive for weakness, dizziness yesterday has resolved. The patient is tolerating medications without difficulties and is otherwise without complaint today.   Past Medical History:  Diagnosis Date  . Anxiety   . Arthritis    "back; hands" (01/13/2015)  . First degree AV block   . Gastroesophageal reflux disease   . History of hiatal hernia   . Hx of echocardiogram    echo 07/2009:  mild focal basal septal hypertrophy, EF 55-60%, mild MR, mod LAE, mild RAE, PASP 45  . Hyperlipidemia   . Hypertension   . Hypothyroidism   . Persistent atrial fibrillation       . Seasonal allergies   . Squamous carcinoma 2016   "right leg; right jawline"  . Squamous cell skin cancer   . SVT (supraventricular tachycardia) (HCC)    possible atrial tachycardia, possible junctional tacycardia   Past Surgical History:  Procedure Laterality Date  . ABDOMINAL HYSTERECTOMY  ~ 1975  . APPENDECTOMY  ~ 1975  . CARDIAC CATHETERIZATION  1990's  . CARDIOVERSION  01/13/2015   "didn't work"  . CARDIOVERSION N/A 01/13/2015   Procedure: CARDIOVERSION;  Surgeon: Josue Hector, MD;  Location: [facility] ENDOSCOPY;  Service: Cardiovascular;  Laterality: N/A;  . CARDIOVERSION N/A 06/21/2015   Procedure: CARDIOVERSION;  Surgeon: Larey Dresser, MD;  Location: [facility];   Service: Cardiovascular;  Laterality: N/A;  . CATARACT EXTRACTION W/ INTRAOCULAR LENS  IMPLANT, BILATERAL Bilateral 2011  . DILATION AND CURETTAGE OF UTERUS    . ELECTROPHYSIOLOGIC STUDY N/A 07/17/2015   Procedure: Cardioversion;  Surgeon: Deboraha Sprang, MD;  Location: [facility];  Service: Cardiovascular;  Laterality: N/A;  . KNEE ARTHROSCOPY Right 1990's  . LAPAROSCOPIC BILATERAL SALPINGO OOPHERECTOMY Bilateral ~ 2004  . SQUAMOUS CELL CARCINOMA EXCISION  2016   "right leg; right jawline"    Current Outpatient Medications  Medication Sig Dispense Refill  . acetaminophen (TYLENOL) 500 MG tablet Take 500 mg by mouth every 8 (eight) hours as needed for mild pain or moderate pain.    . calcium carbonate (TUMS - DOSED IN MG ELEMENTAL CALCIUM) 500 MG chewable tablet Chew 1 tablet by mouth daily as needed  for indigestion or heartburn.    . cholecalciferol (VITAMIN D) 1000 UNITS tablet Take 1,000 Units by mouth daily.    [name] diltiazem (CARDIZEM CD) 180 MG 24 hr capsule Take 180 mg by mouth daily.     . INCRUSE ELLIPTA 62.5 MCG/INH AEPB TAKE 1 PUFF BY MOUTH EVERY DAY  5  . labetalol (NORMODYNE) 100 MG tablet Take 100 mg by mouth 2 (two) times daily. Take an extra half tablet in the morning    . levothyroxine (SYNTHROID, LEVOTHROID) 50 MCG tablet Take 50 mcg by mouth daily.      . Multiple Vitamins-Minerals (VITEYES AREDS FORMULA/LUTEIN) CAPS Take 1 capsule by mouth 2 (two) times daily.     [name] omeprazole (PRILOSEC) 40 MG capsule Take by mouth daily.  1  . rivaroxaban (XARELTO) 15 MG TABS tablet Take 1 tablet (15 mg total) by mouth daily with supper. 30 tablet 6  . diltiazem (CARDIZEM) 30 MG tablet Take 1/2 - 1 tablet every 4 hours AS NEEDED for heart rate >100 as long as blood pressure >100 (Patient not taking: Reported on 10/09/2018) 30 tablet 1   No current facility-administered medications for this encounter.     Allergies  Allergen Reactions  . Amiodarone Nausea And Vomiting    Only at  higher dose.  Able to tolerate lower dose  . Dabigatran Other (See Comments)    Unknown, per pt   . Sulfonamide Derivatives     REACTION: hives    Social History   Socioeconomic History  . Marital status: Widowed    Spouse name: Not on file  . Number of children: Not on file  . Years of education: Not on file  . Highest education level: Not on file  Occupational History  . Not on file  Social Needs  . Financial resource strain: Not on file  . Food insecurity:    Worry: Not on file    Inability: Not on file  . Transportation needs:    Medical: Not on file    Non-medical: Not on file  Tobacco Use  . Smoking status: Never Smoker  . Smokeless tobacco: Never Used  Substance and Sexual Activity  . Alcohol use: No  . Drug use: No  . Sexual activity: Never  Lifestyle  . Physical activity:    Days per week: Not on file    Minutes per session: Not on file  . Stress: Not on file  Relationships  . Social connections:    Talks on phone: Not on file    Gets together: Not on file    Attends religious service: Not on file    Active member of club or organization: Not on file    Attends meetings of clubs or organizations: Not on file    Relationship status: Not on file  . Intimate partner violence:    Fear of current or ex partner: Not on file    Emotionally abused: Not on file    Physically abused: Not on file    Forced sexual activity: Not on file  Other Topics Concern  . Not on file  Social History Narrative   She has two children, two grandchildren and three great grandchildren    Family History  Problem Relation Age of Onset  . Heart disease Father     ROS- All systems are reviewed and negative except as per the HPI above  Physical Exam: Vitals:   10/09/18 1137  BP: (!) 150/76  Pulse: 64  Weight: 51.3  kg  Height: 5\' 2"  (1.575 m)    GEN- The patient is well appearing, alert and oriented x 3 today.   Head- normocephalic, atraumatic Eyes-  Sclera clear,  conjunctiva pink Ears- hearing intact Oropharynx- clear Neck- supple, no JVP Lymph- no cervical lymphadenopathy Lungs- Clear to ausculation bilaterally, normal work of breathing Heart- regular rate and rhythm, no murmurs, rubs or gallops, PMI not laterally displaced GI- soft, NT, ND, + BS Extremities- no clubbing, cyanosis, or edema MS- no significant deformity or atrophy Skin- no rash or lesion Psych- euthymic mood, full affect Neuro- strength and sensation are intact  EKG atrial flutter at 62 bpm Epic records reviewed lexi myoview-11/13/16- Study Highlights    Nuclear stress EF: 65%.  Baseline EKG showed atrial fibrillation with nonspecific ST abnormality. During the infusion there was 3mm of horizontal to downsloping ST segment depression in the inferolateral leads  There is a small defect of mild severity present in the basal inferoseptal and mid inferoseptal location. The defect is non-reversible and in the setting of normal LVF, likely represents attenuation artifact. No ischemia noted.  This is a low risk study. Nondiagnostic EKG changes given baseline ST abnormality.  The left ventricular ejection fraction is normal (55-65%).   Echo-LV EF: 55% -   60% Study Conclusions  - Left ventricle: The cavity size was normal. There was mild   concentric hypertrophy. Systolic function was normal. The   estimated ejection fraction was in the range of 55% to 60%. Wall   motion was normal; there were no regional wall motion   abnormalities. - Aortic valve: Transvalvular velocity was within the normal range.   There was no stenosis. There was trivial regurgitation. - Mitral valve: Mildly calcified annulus. Transvalvular velocity   was within the normal range. There was no evidence for stenosis.   There was trivial regurgitation. - Left atrium: The atrium was severely dilated. - Right ventricle: The cavity size was normal. Wall thickness was   normal. Systolic function was  normal. - Right atrium: The atrium was mildly dilated. - Atrial septum: No defect or patent foramen ovale was identified   by color flow Doppler. - Tricuspid valve: There was mild regurgitation. - Pulmonary arteries: Systolic pressure was within the normal   range. PA peak pressure: 32 mm Hg (S).   Assessment and Plan: 1. Longstanding permanent afib/flutter Rate controlled Continue Cardizem and labetalol Continue xarelto but at 15 mg daily    2. HTN Overall, is stable  Avoid salt  3.LLE No appreciable swelling today   F/u in afib clinic in 6 months   Butch Penny C. Carroll, [facility] [address] [place], [place] [address] [phone]

## 2018-10-20 ENCOUNTER — Other Ambulatory Visit (HOSPITAL_COMMUNITY): Payer: Self-pay | Admitting: Nurse Practitioner

## 2018-11-13 ENCOUNTER — Encounter (INDEPENDENT_AMBULATORY_CARE_PROVIDER_SITE_OTHER): Payer: Medicare Other | Admitting: Ophthalmology

## 2019-01-18 ENCOUNTER — Emergency Department (HOSPITAL_COMMUNITY): Payer: Medicare Other

## 2019-01-18 ENCOUNTER — Inpatient Hospital Stay (HOSPITAL_COMMUNITY)
Admission: EM | Admit: 2019-01-18 | Discharge: 2019-01-21 | DRG: 064 | Disposition: A | Discharge status: Hospice | Payer: Medicare Other | Attending: Internal Medicine | Admitting: Internal Medicine

## 2019-01-18 ENCOUNTER — Encounter (HOSPITAL_COMMUNITY): Payer: Self-pay

## 2019-01-18 ENCOUNTER — Other Ambulatory Visit: Payer: Self-pay

## 2019-01-18 DIAGNOSIS — R1310 Dysphagia, unspecified: Secondary | ICD-10-CM | POA: Diagnosis present

## 2019-01-18 DIAGNOSIS — I629 Nontraumatic intracranial hemorrhage, unspecified: Secondary | ICD-10-CM | POA: Diagnosis not present

## 2019-01-18 DIAGNOSIS — E039 Hypothyroidism, unspecified: Secondary | ICD-10-CM | POA: Diagnosis present

## 2019-01-18 DIAGNOSIS — I615 Nontraumatic intracerebral hemorrhage, intraventricular: Principal | ICD-10-CM | POA: Diagnosis present

## 2019-01-18 DIAGNOSIS — R29818 Other symptoms and signs involving the nervous system: Secondary | ICD-10-CM | POA: Diagnosis not present

## 2019-01-18 DIAGNOSIS — E785 Hyperlipidemia, unspecified: Secondary | ICD-10-CM | POA: Diagnosis present

## 2019-01-18 DIAGNOSIS — R29810 Facial weakness: Secondary | ICD-10-CM | POA: Diagnosis not present

## 2019-01-18 DIAGNOSIS — Z7901 Long term (current) use of anticoagulants: Secondary | ICD-10-CM

## 2019-01-18 DIAGNOSIS — Z515 Encounter for palliative care: Secondary | ICD-10-CM | POA: Diagnosis not present

## 2019-01-18 DIAGNOSIS — R918 Other nonspecific abnormal finding of lung field: Secondary | ICD-10-CM | POA: Diagnosis present

## 2019-01-18 DIAGNOSIS — R29898 Other symptoms and signs involving the musculoskeletal system: Secondary | ICD-10-CM | POA: Diagnosis not present

## 2019-01-18 DIAGNOSIS — G8194 Hemiplegia, unspecified affecting left nondominant side: Secondary | ICD-10-CM | POA: Diagnosis present

## 2019-01-18 DIAGNOSIS — G919 Hydrocephalus, unspecified: Secondary | ICD-10-CM | POA: Diagnosis present

## 2019-01-18 DIAGNOSIS — Z85828 Personal history of other malignant neoplasm of skin: Secondary | ICD-10-CM

## 2019-01-18 DIAGNOSIS — N179 Acute kidney failure, unspecified: Secondary | ICD-10-CM | POA: Diagnosis present

## 2019-01-18 DIAGNOSIS — Z66 Do not resuscitate: Secondary | ICD-10-CM | POA: Diagnosis present

## 2019-01-18 DIAGNOSIS — J302 Other seasonal allergic rhinitis: Secondary | ICD-10-CM | POA: Diagnosis present

## 2019-01-18 DIAGNOSIS — R52 Pain, unspecified: Secondary | ICD-10-CM | POA: Diagnosis not present

## 2019-01-18 DIAGNOSIS — R51 Headache: Secondary | ICD-10-CM | POA: Diagnosis not present

## 2019-01-18 DIAGNOSIS — Z888 Allergy status to other drugs, medicaments and biological substances status: Secondary | ICD-10-CM

## 2019-01-18 DIAGNOSIS — Z90710 Acquired absence of both cervix and uterus: Secondary | ICD-10-CM

## 2019-01-18 DIAGNOSIS — G459 Transient cerebral ischemic attack, unspecified: Secondary | ICD-10-CM | POA: Diagnosis not present

## 2019-01-18 DIAGNOSIS — I129 Hypertensive chronic kidney disease with stage 1 through stage 4 chronic kidney disease, or unspecified chronic kidney disease: Secondary | ICD-10-CM | POA: Diagnosis present

## 2019-01-18 DIAGNOSIS — F419 Anxiety disorder, unspecified: Secondary | ICD-10-CM | POA: Diagnosis present

## 2019-01-18 DIAGNOSIS — M47812 Spondylosis without myelopathy or radiculopathy, cervical region: Secondary | ICD-10-CM | POA: Diagnosis present

## 2019-01-18 DIAGNOSIS — I482 Chronic atrial fibrillation, unspecified: Secondary | ICD-10-CM | POA: Diagnosis not present

## 2019-01-18 DIAGNOSIS — N183 Chronic kidney disease, stage 3 (moderate): Secondary | ICD-10-CM | POA: Diagnosis present

## 2019-01-18 DIAGNOSIS — I4891 Unspecified atrial fibrillation: Secondary | ICD-10-CM | POA: Diagnosis not present

## 2019-01-18 DIAGNOSIS — Z79890 Hormone replacement therapy (postmenopausal): Secondary | ICD-10-CM

## 2019-01-18 DIAGNOSIS — Z1159 Encounter for screening for other viral diseases: Secondary | ICD-10-CM

## 2019-01-18 DIAGNOSIS — J690 Pneumonitis due to inhalation of food and vomit: Secondary | ICD-10-CM | POA: Diagnosis present

## 2019-01-18 DIAGNOSIS — M2550 Pain in unspecified joint: Secondary | ICD-10-CM | POA: Diagnosis not present

## 2019-01-18 DIAGNOSIS — R404 Transient alteration of awareness: Secondary | ICD-10-CM | POA: Diagnosis not present

## 2019-01-18 DIAGNOSIS — Z79899 Other long term (current) drug therapy: Secondary | ICD-10-CM

## 2019-01-18 DIAGNOSIS — I69191 Dysphagia following nontraumatic intracerebral hemorrhage: Secondary | ICD-10-CM | POA: Diagnosis not present

## 2019-01-18 DIAGNOSIS — E875 Hyperkalemia: Secondary | ICD-10-CM | POA: Diagnosis present

## 2019-01-18 DIAGNOSIS — M1990 Unspecified osteoarthritis, unspecified site: Secondary | ICD-10-CM | POA: Diagnosis present

## 2019-01-18 DIAGNOSIS — R410 Disorientation, unspecified: Secondary | ICD-10-CM | POA: Diagnosis not present

## 2019-01-18 DIAGNOSIS — I618 Other nontraumatic intracerebral hemorrhage: Secondary | ICD-10-CM | POA: Diagnosis present

## 2019-01-18 DIAGNOSIS — I4819 Other persistent atrial fibrillation: Secondary | ICD-10-CM | POA: Diagnosis present

## 2019-01-18 DIAGNOSIS — K219 Gastro-esophageal reflux disease without esophagitis: Secondary | ICD-10-CM | POA: Diagnosis present

## 2019-01-18 DIAGNOSIS — I10 Essential (primary) hypertension: Secondary | ICD-10-CM | POA: Diagnosis not present

## 2019-01-18 DIAGNOSIS — S0990XA Unspecified injury of head, initial encounter: Secondary | ICD-10-CM | POA: Diagnosis present

## 2019-01-18 DIAGNOSIS — I619 Nontraumatic intracerebral hemorrhage, unspecified: Secondary | ICD-10-CM | POA: Diagnosis not present

## 2019-01-18 DIAGNOSIS — Z6820 Body mass index (BMI) 20.0-20.9, adult: Secondary | ICD-10-CM | POA: Diagnosis not present

## 2019-01-18 DIAGNOSIS — Z8249 Family history of ischemic heart disease and other diseases of the circulatory system: Secondary | ICD-10-CM

## 2019-01-18 DIAGNOSIS — R29727 NIHSS score 27: Secondary | ICD-10-CM | POA: Diagnosis present

## 2019-01-18 DIAGNOSIS — R4182 Altered mental status, unspecified: Secondary | ICD-10-CM | POA: Diagnosis not present

## 2019-01-18 DIAGNOSIS — I69154 Hemiplegia and hemiparesis following nontraumatic intracerebral hemorrhage affecting left non-dominant side: Secondary | ICD-10-CM | POA: Diagnosis not present

## 2019-01-18 DIAGNOSIS — Z741 Need for assistance with personal care: Secondary | ICD-10-CM | POA: Diagnosis not present

## 2019-01-18 DIAGNOSIS — Z03818 Encounter for observation for suspected exposure to other biological agents ruled out: Secondary | ICD-10-CM | POA: Diagnosis not present

## 2019-01-18 DIAGNOSIS — I69122 Dysarthria following nontraumatic intracerebral hemorrhage: Secondary | ICD-10-CM | POA: Diagnosis not present

## 2019-01-18 DIAGNOSIS — Z7401 Bed confinement status: Secondary | ICD-10-CM | POA: Diagnosis not present

## 2019-01-18 DIAGNOSIS — Z882 Allergy status to sulfonamides status: Secondary | ICD-10-CM

## 2019-01-18 LAB — CBC
HCT: 51.7 % — ABNORMAL HIGH (ref 36.0–46.0)
Hemoglobin: 16.4 g/dL — ABNORMAL HIGH (ref 12.0–15.0)
MCH: 25.8 pg — ABNORMAL LOW (ref 26.0–34.0)
MCHC: 31.7 g/dL (ref 30.0–36.0)
MCV: 81.4 fL (ref 80.0–100.0)
Platelets: 272 10*3/uL (ref 150–400)
RBC: 6.35 MIL/uL — ABNORMAL HIGH (ref 3.87–5.11)
RDW: 13.7 % (ref 11.5–15.5)
WBC: 18.9 10*3/uL — ABNORMAL HIGH (ref 4.0–10.5)
nRBC: 0 % (ref 0.0–0.2)

## 2019-01-18 LAB — COMPREHENSIVE METABOLIC PANEL
ALT: 42 U/L (ref 0–44)
AST: 144 U/L — ABNORMAL HIGH (ref 15–41)
Albumin: 4.1 g/dL (ref 3.5–5.0)
Alkaline Phosphatase: 117 U/L (ref 38–126)
Anion gap: 14 (ref 5–15)
BUN: 38 mg/dL — ABNORMAL HIGH (ref 8–23)
CO2: 22 mmol/L (ref 22–32)
Calcium: 9.8 mg/dL (ref 8.9–10.3)
Chloride: 101 mmol/L (ref 98–111)
Creatinine, Ser: 1.69 mg/dL — ABNORMAL HIGH (ref 0.44–1.00)
GFR calc Af Amer: 30 mL/min — ABNORMAL LOW (ref >60)
GFR calc non Af Amer: 26 mL/min — ABNORMAL LOW (ref >60)
Glucose, Bld: 149 mg/dL — ABNORMAL HIGH (ref 70–99)
Potassium: 5.8 mmol/L — ABNORMAL HIGH (ref 3.5–5.1)
Sodium: 137 mmol/L (ref 135–145)
Total Bilirubin: 1.5 mg/dL — ABNORMAL HIGH (ref 0.3–1.2)
Total Protein: 7 g/dL (ref 6.5–8.1)

## 2019-01-18 LAB — DIFFERENTIAL
Abs Immature Granulocytes: 0.11 10*3/uL — ABNORMAL HIGH (ref 0.00–0.07)
Basophils Absolute: 0 10*3/uL (ref 0.0–0.1)
Basophils Relative: 0 %
Eosinophils Absolute: 0 10*3/uL (ref 0.0–0.5)
Eosinophils Relative: 0 %
Immature Granulocytes: 1 %
Lymphocytes Relative: 5 %
Lymphs Abs: 0.9 10*3/uL (ref 0.7–4.0)
Monocytes Absolute: 1.7 10*3/uL — ABNORMAL HIGH (ref 0.1–1.0)
Monocytes Relative: 9 %
Neutro Abs: 16.2 10*3/uL — ABNORMAL HIGH (ref 1.7–7.7)
Neutrophils Relative %: 85 %

## 2019-01-18 LAB — I-STAT CHEM 8, ED
BUN: 51 mg/dL — ABNORMAL HIGH (ref 8–23)
Calcium, Ion: 1.08 mmol/L — ABNORMAL LOW (ref 1.15–1.40)
Chloride: 103 mmol/L (ref 98–111)
Creatinine, Ser: 1.4 mg/dL — ABNORMAL HIGH (ref 0.44–1.00)
Glucose, Bld: 146 mg/dL — ABNORMAL HIGH (ref 70–99)
HCT: 54 % — ABNORMAL HIGH (ref 36.0–46.0)
Hemoglobin: 18.4 g/dL — ABNORMAL HIGH (ref 12.0–15.0)
Potassium: 5.8 mmol/L — ABNORMAL HIGH (ref 3.5–5.1)
Sodium: 135 mmol/L (ref 135–145)
TCO2: 25 mmol/L (ref 22–32)

## 2019-01-18 LAB — GLUCOSE, CAPILLARY: Glucose-Capillary: 119 mg/dL — ABNORMAL HIGH (ref 70–99)

## 2019-01-18 LAB — CBG MONITORING, ED: Glucose-Capillary: 151 mg/dL — ABNORMAL HIGH (ref 70–99)

## 2019-01-18 LAB — SARS CORONAVIRUS 2 BY RT PCR (HOSPITAL ORDER, PERFORMED IN ~~LOC~~ HOSPITAL LAB): SARS Coronavirus 2: NEGATIVE

## 2019-01-18 LAB — ETHANOL: Alcohol, Ethyl (B): <10 mg/dL (ref <10)

## 2019-01-18 LAB — POTASSIUM: Potassium: 5.7 mmol/L — ABNORMAL HIGH (ref 3.5–5.1)

## 2019-01-18 LAB — PROTIME-INR
INR: 1 (ref 0.8–1.2)
Prothrombin Time: 13.3 seconds (ref 11.4–15.2)

## 2019-01-18 LAB — APTT: aPTT: 30 seconds (ref 24–36)

## 2019-01-18 MED ORDER — ACETAMINOPHEN 325 MG PO TABS: 650.0000 mg | ORAL_TABLET | ORAL | Status: DC | PRN

## 2019-01-18 MED ORDER — DEXTROSE-NACL 5-0.45 % IV SOLN
INTRAVENOUS | Status: DC
  Administered 2019-01-18: 21:00:00 via INTRAVENOUS

## 2019-01-18 MED ORDER — PANTOPRAZOLE SODIUM 40 MG IV SOLR
40.0000 mg | INTRAVENOUS | Status: DC
  Administered 2019-01-18: 40 mg via INTRAVENOUS
  Filled 2019-01-18: qty 40

## 2019-01-18 MED ORDER — SENNOSIDES-DOCUSATE SODIUM 8.6-50 MG PO TABS: 1.0000 | ORAL_TABLET | Freq: Every evening | ORAL | Status: DC | PRN

## 2019-01-18 MED ORDER — METOPROLOL TARTRATE 5 MG/5ML IV SOLN
2.5000 mg | Freq: Four times a day (QID) | INTRAVENOUS | Status: DC
  Administered 2019-01-18 – 2019-01-19 (×2): 2.5 mg via INTRAVENOUS
  Filled 2019-01-18 (×2): qty 5

## 2019-01-18 MED ORDER — INSULIN ASPART 100 UNIT/ML ~~LOC~~ SOLN
5.0000 [IU] | Freq: Once | SUBCUTANEOUS | Status: AC
  Administered 2019-01-18: 22:00:00 5 [IU] via INTRAVENOUS

## 2019-01-18 MED ORDER — ACETAMINOPHEN 160 MG/5ML PO SOLN: 650.0000 mg | ORAL | Status: DC | PRN

## 2019-01-18 MED ORDER — LEVOTHYROXINE SODIUM 100 MCG/5ML IV SOLN
25.0000 ug | Freq: Every day | INTRAVENOUS | Status: DC
  Administered 2019-01-18: 25 ug via INTRAVENOUS
  Filled 2019-01-18: qty 5

## 2019-01-18 MED ORDER — ACETAMINOPHEN 650 MG RE SUPP
650.0000 mg | RECTAL | Status: DC | PRN
  Administered 2019-01-19: 650 mg via RECTAL
  Filled 2019-01-18: qty 1

## 2019-01-18 MED ORDER — ONDANSETRON HCL 4 MG/2ML IJ SOLN
4.0000 mg | Freq: Four times a day (QID) | INTRAMUSCULAR | Status: DC | PRN
  Administered 2019-01-18: 4 mg via INTRAVENOUS
  Filled 2019-01-18: qty 2

## 2019-01-18 MED ORDER — DEXTROSE 50 % IV SOLN
1.0000 | Freq: Once | INTRAVENOUS | Status: AC
  Administered 2019-01-18: 50 mL via INTRAVENOUS
  Filled 2019-01-18: qty 50

## 2019-01-18 NOTE — ED Provider Notes (Signed)
[place] EMERGENCY DEPARTMENT Provider Note   CSN: [id number] Arrival date & time: 01/18/19  5885  An emergency department physician performed an initial assessment on this suspected stroke patient at 1637.  History   Chief Complaint Chief Complaint  Patient presents with   Code Stroke    HPI [name] is a 85 y.o. female.     Patient is a 85 year old female who has a history of atrial fibrillation on Xarelto who presents with altered mental status.  Her children stated that they last talked to the patient yesterday around 6 PM.  She was found by them today to be slumped over in her chair with her dinner from last night on the table and spilled.  She had chili all over her face and was slumped over the chair with her head down although did not fall out of the chair.  She was found by EMS to have left-sided weakness and questionable left-sided gaze but they were not able to completely ascertain that.  She normally is independent and completely oriented and ambulatory.     Past Medical History:  Diagnosis Date   Anxiety    Arthritis    "back; hands" (01/13/2015)   First degree AV block    Gastroesophageal reflux disease    History of hiatal hernia    Hx of echocardiogram    echo 07/2009:  mild focal basal septal hypertrophy, EF 55-60%, mild MR, mod LAE, mild RAE, PASP 45   Hyperlipidemia    Hypertension    Hypothyroidism    Persistent atrial fibrillation        Seasonal allergies    Squamous carcinoma 2016   "right leg; right jawline"   Squamous cell skin cancer    SVT (supraventricular tachycardia) ([place])    possible atrial tachycardia, possible junctional tacycardia    Patient Active Problem List   Diagnosis Date Noted   Constipation 08/31/2015   Atrial tachycardia ([place]) 01/13/2015   Anticoagulated on Coumadin 11/20/2012   Chest pain 02/10/2012   Hyperlipidemia 05/28/2010   HYPERTENSION, BENIGN 10/31/2009   Atrial  fibrillation ([place]) 10/31/2009   SVT/ PSVT/ PAT 09/04/2009    Past Surgical History:  Procedure Laterality Date   ABDOMINAL HYSTERECTOMY  ~ [place]  ~ [place]  01/13/2015   "didn't work"   CARDIOVERSION N/A 01/13/2015   Procedure: CARDIOVERSION;  Surgeon: Josue Hector, MD;  Location: [facility] ENDOSCOPY;  Service: Cardiovascular;  Laterality: N/A;   CARDIOVERSION N/A 06/21/2015   Procedure: CARDIOVERSION;  Surgeon: Larey Dresser, MD;  Location: [facility];  Service: Cardiovascular;  Laterality: N/A;   CATARACT EXTRACTION W/ INTRAOCULAR LENS  IMPLANT, BILATERAL Bilateral 2011   [place] OF UTERUS     ELECTROPHYSIOLOGIC STUDY N/A 07/17/2015   Procedure: Cardioversion;  Surgeon: Deboraha Sprang, MD;  Location: [place] CV LAB;  Service: Cardiovascular;  Laterality: N/A;   KNEE ARTHROSCOPY Right 1990's   LAPAROSCOPIC BILATERAL SALPINGO OOPHERECTOMY Bilateral ~ 2004   SQUAMOUS CELL CARCINOMA EXCISION  2016   "right leg; right jawline"     OB History   No obstetric history on file.      Home Medications    Prior to Admission medications   Medication Sig Start Date End Date Taking? Authorizing Provider  acetaminophen (TYLENOL) 500 MG tablet Take 500 mg by mouth every 8 (eight) hours as needed for mild pain or moderate pain.  [provider]  calcium carbonate (TUMS - DOSED IN MG ELEMENTAL CALCIUM) 500 MG chewable tablet Chew 1 tablet by mouth daily as needed for indigestion or heartburn.    [provider]  cholecalciferol (VITAMIN D) 1000 UNITS tablet Take 1,000 Units by mouth daily.    [provider]  diltiazem (CARDIZEM CD) 180 MG 24 hr capsule Take 180 mg by mouth daily.  08/04/15   Deboraha Sprang, MD  diltiazem (CARDIZEM) 30 MG tablet Take 1/2 - 1 tablet every 4 hours AS NEEDED for heart rate >100 as long as blood pressure >100 Patient not taking: Reported on  10/09/2018 03/04/18   Sherran Needs, NP  INCRUSE ELLIPTA 62.5 MCG/INH AEPB TAKE 1 PUFF BY MOUTH EVERY DAY 01/13/17   [provider]  labetalol (NORMODYNE) 100 MG tablet Take 100 mg by mouth 2 (two) times daily. Take an extra half tablet in the morning    [provider]  levothyroxine (SYNTHROID, LEVOTHROID) 50 MCG tablet Take 50 mcg by mouth daily.      [provider]  Multiple Vitamins-Minerals (VITEYES AREDS FORMULA/LUTEIN) CAPS Take 1 capsule by mouth 2 (two) times daily.     [provider]  omeprazole (PRILOSEC) 40 MG capsule Take by mouth daily. 01/08/17   [provider]  XARELTO 15 MG TABS tablet TAKE 1 TABLET (15 MG TOTAL) BY MOUTH DAILY WITH SUPPER. 10/20/18   Sherran Needs, NP  pantoprazole (PROTONIX) 20 MG tablet Take 1 tablet (20 mg total) by mouth daily. 11/27/10 10/21/11  Burnell Blanks, MD  pravastatin (PRAVACHOL) 80 MG tablet Take 40 mg by mouth Daily.  04/16/11 10/21/11  [provider]  spironolactone (ALDACTONE) 25 MG tablet Take 1/2 tablet by mouth once daily 12/11/10 10/21/11  Deboraha Sprang, MD    Family History Family History  Problem Relation Age of Onset   Heart disease Father     Social History Social History   Tobacco Use   Smoking status: Never Smoker   Smokeless tobacco: Never Used  Substance Use Topics   Alcohol use: No   Drug use: No     Allergies   Amiodarone, Dabigatran, and Sulfonamide derivatives   Review of Systems Review of Systems  Unable to perform ROS: Mental status change     Physical Exam Updated Vital Signs BP (!) 133/91    Temp (!) 95.1 F (35.1 C) (Rectal) Comment: increased room temp and warm blankets  Physical Exam Constitutional:      Appearance: She is well-developed.  HENT:     Head: Normocephalic and atraumatic.     Comments: Patient has facial edema present she has chilly and possible vomit to the side of her face Eyes:     Comments: Pupils are  small but equal bilaterally  Neck:     Comments: C-collar in place Cardiovascular:     Rate and Rhythm: Normal rate and regular rhythm.     Heart sounds: Normal heart sounds.  Pulmonary:     Effort: Pulmonary effort is normal. No respiratory distress.     Breath sounds: Normal breath sounds. No wheezing or rales.  Chest:     Chest wall: No tenderness.  Abdominal:     General: Bowel sounds are normal.     Palpations: Abdomen is soft.     Tenderness: There is no abdominal tenderness. There is no guarding or rebound.  Musculoskeletal: Normal range of motion.     Comments: No pain on range  of motion of her extremities  Lymphadenopathy:     Cervical: No cervical adenopathy.  Skin:    General: Skin is warm and dry.     Findings: No rash.  Neurological:     Comments: Patient will open her eyes and tried to speak.  She will say her name.  She has paralysis of her left upper extremity.  She does have movement of both lower extremities but I cannot fully assess these.  She seems to have some left-sided neglect.      ED Treatments / Results  Labs (all labs ordered are listed, but only abnormal results are displayed) Labs Reviewed  CBC - Abnormal; Notable for the following components:      Result Value   WBC 18.9 (*)    RBC 6.35 (*)    Hemoglobin 16.4 (*)    HCT 51.7 (*)    MCH 25.8 (*)    All other components within normal limits  DIFFERENTIAL - Abnormal; Notable for the following components:   Neutro Abs 16.2 (*)    Monocytes Absolute 1.7 (*)    Abs Immature Granulocytes 0.11 (*)    All other components within normal limits  COMPREHENSIVE METABOLIC PANEL - Abnormal; Notable for the following components:   Potassium 5.8 (*)    Glucose, Bld 149 (*)    BUN 38 (*)    Creatinine, Ser 1.69 (*)    AST 144 (*)    Total Bilirubin 1.5 (*)    GFR calc non Af Amer 26 (*)    GFR calc Af Amer 30 (*)    All other components within normal limits  I-STAT CHEM 8, ED - Abnormal; Notable for  the following components:   Potassium 5.8 (*)    BUN 51 (*)    Creatinine, Ser 1.40 (*)    Glucose, Bld 146 (*)    Calcium, Ion 1.08 (*)    Hemoglobin 18.4 (*)    HCT 54.0 (*)    All other components within normal limits  CBG MONITORING, ED - Abnormal; Notable for the following components:   Glucose-Capillary 151 (*)    All other components within normal limits  SARS CORONAVIRUS 2 (HOSPITAL ORDER, [place] [facility])  ETHANOL  PROTIME-INR  APTT  RAPID URINE DRUG SCREEN, HOSP PERFORMED  URINALYSIS, ROUTINE W REFLEX MICROSCOPIC  CK    EKG EKG Interpretation  Date/Time:  Monday January 18 2019 17:21:49 EDT Ventricular Rate:  113 PR Interval:    QRS Duration: 84 QT Interval:  356 QTC Calculation: 489 R Axis:   22 Text Interpretation:  Atrial fibrillation Paired ventricular premature complexes LVH with secondary repolarization abnormality Anterior infarct, old Confirmed by Malvin Johns [phone]) on 01/18/2019 5:43:48 PM   Radiology Ct Cervical Spine Wo Contrast  Result Date: 01/18/2019 CLINICAL DATA:  Code stroke. Initial evaluation for acute core stroke. Left-sided deficits. EXAM: CT HEAD WITHOUT CONTRAST CT CERVICAL SPINE WITHOUT CONTRAST TECHNIQUE: Multidetector CT imaging of the head and cervical spine was performed following the standard protocol without intravenous contrast. Multiplanar CT image reconstructions of the cervical spine were also generated. COMPARISON:  Prior CT from 08/25/2015. FINDINGS: CT HEAD FINDINGS Brain: Acute intraparenchymal hemorrhage centered at the right thalamus measures 3.8 x 3.8 x 5.0 cm (estimated volume 36 cc). Localized vasogenic edema with mild regional mass effect. Associated right-to-left midline shift measures up to 6 mm. Associated intraventricular extension with blood seen within the lateral and third ventricles. Dilatation of the temporal horns of  both lateral ventricles compatible with associated early hydrocephalus. No  ventricular trapping. Basilar cisterns remain patent. No other acute intracranial hemorrhage. No other acute large vessel territory infarct. Underlying atrophy with advanced chronic microvascular ischemic disease. Scattered superimposed remote lacunar infarcts within the bilateral basal ganglia and left thalamus. No appreciable mass lesion. No extra-axial fluid collection. Vascular: No asymmetric hyperdense vessel. Scattered vascular calcifications noted within the carotid siphons. Skull: Diffuse soft tissue edema seen throughout the scalp and left greater than right face. Calvarium intact. Sinuses/Orbits: Globes and orbital soft tissues demonstrate no acute finding. Scattered mucosal thickening within the ethmoidal air cells. Small air-fluid levels noted within the right maxillary and left sphenoid sinuses. No mastoid effusion. Other: None. CT CERVICAL SPINE FINDINGS Alignment: Straightening of the normal cervical lordosis. Grade 1 anterolisthesis of C2 on C3 and C7 on T1, with grade 1 retrolisthesis of C3 on C4 and C5 on C6. Findings likely chronic and facet mediated. Skull base and vertebrae: Skull base intact. Normal C1-2 articulations are preserved in the dens is intact. Vertebral body heights maintained. No acute fracture. Focal lucency extending through the left lateral mass of C2 on coronal image 32 favored to be chronic in nature. Soft tissues and spinal canal: Diffuse soft tissue edema extends from the scalp into the left lateral and posterior neck. Diffuse prevertebral edema, suspected to be related to the scalp edema. Prominent vascular calcifications about the carotid bifurcations. 8 mm hypodense right thyroid nodule noted, of doubtful significance. Spinal canal within normal limits. Disc levels: Moderate multilevel cervical spondylolysis at C3-4 through C7-T1. C4 and C5 vertebral bodies are largely ankylosed. Upper chest: Visualized upper chest demonstrates no acute finding. 4 mm nodule present at the  right upper lobe (series 9, image 92). Few additional 4 mm nodules noted within the visualized peripheral left upper lobe, indeterminate. Partially visualized lungs are otherwise clear. Other: None. IMPRESSION: CT BRAIN: 1. Acute intraparenchymal hemorrhage centered at the right thalamus, estimated volume 36 cc, with associated regional mass effect and 6 mm right-to-left midline shift. 2. Associated intraventricular hemorrhage with evidence for early hydrocephalus. 3. Diffuse scalp edema extending into the left greater than right face. 4. Underlying atrophy with advanced chronic microvascular ischemic disease. CT CERVICAL SPINE: 1. No CT evidence for acute fracture or other definite traumatic injury within the cervical spine. 2. Diffuse soft tissue edema extending from the scalp into the left neck, with associated diffuse prevertebral edema. While the prevertebral edema is favored to be related to the diffuse soft tissue edema seen elsewhere within the head and neck, possible superimposed ligamentous injury could also have this appearance. If there is high clinical suspicion for possible occult ligamentous injury, then further evaluation with dedicated MRI would be warranted. 3. Moderate cervical spondylolysis at C3-4 through C7-T1. 4. Multiple scattered subcentimeter pulmonary nodules measuring up to 4 mm within the visualized upper lungs, indeterminate. No follow-up needed if patient is low-risk (and has no known or suspected primary neoplasm). Non-contrast chest CT can be considered in 12 months if patient is high-risk. This recommendation follows the consensus statement: Guidelines for Management of Incidental Pulmonary Nodules Detected on CT Images: From the Fleischner Society 2017; Radiology 2017; 284:228-243. These results were communicated to Dr. Lorraine Lax at [place] 6/22/2020by text page via the [facility] messaging system. Electronically Signed   By: Jeannine Boga M.D.   On: 01/18/2019 17:37   Ct Head  Code Stroke Wo Contrast  Result Date: 01/18/2019 CLINICAL DATA:  Code stroke. Initial evaluation for acute core  stroke. Left-sided deficits. EXAM: CT HEAD WITHOUT CONTRAST CT CERVICAL SPINE WITHOUT CONTRAST TECHNIQUE: Multidetector CT imaging of the head and cervical spine was performed following the standard protocol without intravenous contrast. Multiplanar CT image reconstructions of the cervical spine were also generated. COMPARISON:  Prior CT from 08/25/2015. FINDINGS: CT HEAD FINDINGS Brain: Acute intraparenchymal hemorrhage centered at the right thalamus measures 3.8 x 3.8 x 5.0 cm (estimated volume 36 cc). Localized vasogenic edema with mild regional mass effect. Associated right-to-left midline shift measures up to 6 mm. Associated intraventricular extension with blood seen within the lateral and third ventricles. Dilatation of the temporal horns of both lateral ventricles compatible with associated early hydrocephalus. No ventricular trapping. Basilar cisterns remain patent. No other acute intracranial hemorrhage. No other acute large vessel territory infarct. Underlying atrophy with advanced chronic microvascular ischemic disease. Scattered superimposed remote lacunar infarcts within the bilateral basal ganglia and left thalamus. No appreciable mass lesion. No extra-axial fluid collection. Vascular: No asymmetric hyperdense vessel. Scattered vascular calcifications noted within the carotid siphons. Skull: Diffuse soft tissue edema seen throughout the scalp and left greater than right face. Calvarium intact. Sinuses/Orbits: Globes and orbital soft tissues demonstrate no acute finding. Scattered mucosal thickening within the ethmoidal air cells. Small air-fluid levels noted within the right maxillary and left sphenoid sinuses. No mastoid effusion. Other: None. CT CERVICAL SPINE FINDINGS Alignment: Straightening of the normal cervical lordosis. Grade 1 anterolisthesis of C2 on C3 and C7 on T1, with grade 1  retrolisthesis of C3 on C4 and C5 on C6. Findings likely chronic and facet mediated. Skull base and vertebrae: Skull base intact. Normal C1-2 articulations are preserved in the dens is intact. Vertebral body heights maintained. No acute fracture. Focal lucency extending through the left lateral mass of C2 on coronal image 32 favored to be chronic in nature. Soft tissues and spinal canal: Diffuse soft tissue edema extends from the scalp into the left lateral and posterior neck. Diffuse prevertebral edema, suspected to be related to the scalp edema. Prominent vascular calcifications about the carotid bifurcations. 8 mm hypodense right thyroid nodule noted, of doubtful significance. Spinal canal within normal limits. Disc levels: Moderate multilevel cervical spondylolysis at C3-4 through C7-T1. C4 and C5 vertebral bodies are largely ankylosed. Upper chest: Visualized upper chest demonstrates no acute finding. 4 mm nodule present at the right upper lobe (series 9, image 92). Few additional 4 mm nodules noted within the visualized peripheral left upper lobe, indeterminate. Partially visualized lungs are otherwise clear. Other: None. IMPRESSION: CT BRAIN: 1. Acute intraparenchymal hemorrhage centered at the right thalamus, estimated volume 36 cc, with associated regional mass effect and 6 mm right-to-left midline shift. 2. Associated intraventricular hemorrhage with evidence for early hydrocephalus. 3. Diffuse scalp edema extending into the left greater than right face. 4. Underlying atrophy with advanced chronic microvascular ischemic disease. CT CERVICAL SPINE: 1. No CT evidence for acute fracture or other definite traumatic injury within the cervical spine. 2. Diffuse soft tissue edema extending from the scalp into the left neck, with associated diffuse prevertebral edema. While the prevertebral edema is favored to be related to the diffuse soft tissue edema seen elsewhere within the head and neck, possible  superimposed ligamentous injury could also have this appearance. If there is high clinical suspicion for possible occult ligamentous injury, then further evaluation with dedicated MRI would be warranted. 3. Moderate cervical spondylolysis at C3-4 through C7-T1. 4. Multiple scattered subcentimeter pulmonary nodules measuring up to 4 mm within the visualized upper lungs, indeterminate.  No follow-up needed if patient is low-risk (and has no known or suspected primary neoplasm). Non-contrast chest CT can be considered in 12 months if patient is high-risk. This recommendation follows the consensus statement: Guidelines for Management of Incidental Pulmonary Nodules Detected on CT Images: From the Fleischner Society 2017; Radiology 2017; 284:228-243. These results were communicated to Dr. Lorraine Lax at [place] 6/22/2020by text page via the [facility] messaging system. Electronically Signed   By: Jeannine Boga M.D.   On: 01/18/2019 17:37    Procedures Procedures (including critical care time)  Medications Ordered in ED Medications - No data to display   Initial Impression / Assessment and Plan / ED Course  I have reviewed the triage vital signs and the nursing notes.  Pertinent labs & imaging results that were available during my care of the patient were reviewed by me and considered in my medical decision making (see chart for details).        Patient is a 85 year old female who presents with altered mental status.  On arrival she had left side paralysis with neglect.  Code stroke was activated.  CT scan shows intraparenchymal hemorrhage with extension into the ventricles.  Her blood pressure is okay at 133/91.  She is on Xarelto but the neurologist has evaluated the patient and feels that Eppie Gibson would not be helpful at this point.  Likely her last dose was roughly 24 hours ago or even possibly the day before.  He has spoken to the family who does not want any aggressive treatment measures and  essentially wants to make her comfortable.  Her labs show an acute kidney injury and mild hyperkalemia possibly related to some rhabdo.  Her total CK is still pending.  I have spoken with Dr. Laren Everts who will admit the patient for further care.  She is a DNR.  CRITICAL CARE Performed by: Malvin Johns Total critical care time: 60 minutes Critical care time was exclusive of separately billable procedures and treating other patients. Critical care was necessary to treat or prevent imminent or life-threatening deterioration. Critical care was time spent personally by me on the following activities: development of treatment plan with patient and/or surrogate as well as nursing, discussions with consultants, evaluation of patient's response to treatment, examination of patient, obtaining history from patient or surrogate, ordering and performing treatments and interventions, ordering and review of laboratory studies, ordering and review of radiographic studies, pulse oximetry and re-evaluation of patient's condition.   Final Clinical Impressions(s) / ED Diagnoses   Final diagnoses:  Intracranial hemorrhage [facility])    ED Discharge Orders    None       Malvin Johns, MD 01/18/19 1746

## 2019-01-18 NOTE — H&P (Signed)
[facility]                                                                                    Patient Demographics  [name], is a 85 y.o. female  CSN: [id number]  MRN: [id number]  DOB - [birth year]  Admit Date - 01/18/2019  Outpatient Primary MD for the patient is Jani Gravel, MD   With History of -  Past Medical History:  Diagnosis Date  . Anxiety   . Arthritis    "back; hands" (01/13/2015)  . First degree AV block   . Gastroesophageal reflux disease   . History of hiatal hernia   . Hx of echocardiogram    echo 07/2009:  mild focal basal septal hypertrophy, EF 55-60%, mild MR, mod LAE, mild RAE, PASP 45  . Hyperlipidemia   . Hypertension   . Hypothyroidism   . Persistent atrial fibrillation       . Seasonal allergies   . Squamous carcinoma 2016   "right leg; right jawline"  . Squamous cell skin cancer   . SVT (supraventricular tachycardia) (HCC)    possible atrial tachycardia, possible junctional tacycardia      Past Surgical History:  Procedure Laterality Date  . ABDOMINAL HYSTERECTOMY  ~ 1975  . APPENDECTOMY  ~ 1975  . CARDIAC CATHETERIZATION  1990's  . CARDIOVERSION  01/13/2015   "didn't work"  . CARDIOVERSION N/A 01/13/2015   Procedure: CARDIOVERSION;  Surgeon: Josue Hector, MD;  Location: [facility] ENDOSCOPY;  Service: Cardiovascular;  Laterality: N/A;  . CARDIOVERSION N/A 06/21/2015   Procedure: CARDIOVERSION;  Surgeon: Larey Dresser, MD;  Location: [place];  Service: Cardiovascular;  Laterality: N/A;  . CATARACT EXTRACTION W/ INTRAOCULAR LENS  IMPLANT, BILATERAL Bilateral 2011  . DILATION AND CURETTAGE OF UTERUS    . ELECTROPHYSIOLOGIC STUDY N/A 07/17/2015   Procedure: Cardioversion;  Surgeon: Deboraha Sprang, MD;  Location: [place] CV LAB;  Service: Cardiovascular;  Laterality: N/A;  . KNEE ARTHROSCOPY Right 1990's  . LAPAROSCOPIC BILATERAL SALPINGO OOPHERECTOMY Bilateral ~ 2004  . SQUAMOUS CELL CARCINOMA EXCISION  2016   "right  leg; right jawline"    in for   Chief Complaint  Patient presents with  . Code Stroke     HPI  [name]  is a 85 y.o. female, past medical history significant for atrial fibrillation on Xarelto, hypertension hyperlipidemia who was brought by EMS after she was found by her daughter slumped over a chair.  Patient had her face and yesterday's dinner.  Family reports talking to her yesterday at around 6 PM. Spoke to daughter in the room , she reports that her mother was DNR In the emergency room of the brain showed intraventricular hemorrhage with evidence of early hydrocephalus and associated regional mass-effect and right to left midline shift of 6 mm. There was a cell count was 18.9 with a hemoglobin of 16.4 and creatinine of 1.69.   Review of Systems    In addition to the HPI above,  No Fever-chills, No Headache, No changes with Vision or hearing, No problems swallowing food or Liquids, No Chest pain, Cough or Shortness of Breath, No Abdominal pain,  No Nausea or Vommitting, Bowel movements are regular, No Blood in stool or Urine, No dysuria, No new skin rashes or bruises, No new joints pains-aches,  No new weakness, tingling, numbness in any extremity, No recent weight gain or loss, No polyuria, polydypsia or polyphagia, No significant Mental Stressors.  A full 10 point Review of Systems was done, except as stated above, all other Review of Systems were negative.   Social History Social History   Tobacco Use  . Smoking status: Never Smoker  . Smokeless tobacco: Never Used  Substance Use Topics  . Alcohol use: No     Family History Family History  Problem Relation Age of Onset  . Heart disease Father      Prior to Admission medications   Medication Sig Start Date End Date Taking? Authorizing Provider  acetaminophen (TYLENOL) 500 MG tablet Take 500 mg by mouth every 8 (eight) hours as needed for mild pain or moderate pain.    [provider]   calcium carbonate (TUMS - DOSED IN MG ELEMENTAL CALCIUM) 500 MG chewable tablet Chew 1 tablet by mouth daily as needed for indigestion or heartburn.    [provider]  cholecalciferol (VITAMIN D) 1000 UNITS tablet Take 1,000 Units by mouth daily.    [provider]  diltiazem (CARDIZEM CD) 180 MG 24 hr capsule Take 180 mg by mouth daily.  08/04/15   Deboraha Sprang, MD  diltiazem (CARDIZEM) 30 MG tablet Take 1/2 - 1 tablet every 4 hours AS NEEDED for heart rate >100 as long as blood pressure >100 Patient not taking: Reported on 10/09/2018 03/04/18   Sherran Needs, NP  INCRUSE ELLIPTA 62.5 MCG/INH AEPB TAKE 1 PUFF BY MOUTH EVERY DAY 01/13/17   [provider]  labetalol (NORMODYNE) 100 MG tablet Take 100 mg by mouth 2 (two) times daily. Take an extra half tablet in the morning    [provider]  levothyroxine (SYNTHROID, LEVOTHROID) 50 MCG tablet Take 50 mcg by mouth daily.      [provider]  Multiple Vitamins-Minerals (VITEYES AREDS FORMULA/LUTEIN) CAPS Take 1 capsule by mouth 2 (two) times daily.     [provider]  omeprazole (PRILOSEC) 40 MG capsule Take by mouth daily. 01/08/17   [provider]  XARELTO 15 MG TABS tablet TAKE 1 TABLET (15 MG TOTAL) BY MOUTH DAILY WITH SUPPER. 10/20/18   Sherran Needs, NP  pantoprazole (PROTONIX) 20 MG tablet Take 1 tablet (20 mg total) by mouth daily. 11/27/10 10/21/11  Burnell Blanks, MD  pravastatin (PRAVACHOL) 80 MG tablet Take 40 mg by mouth Daily.  04/16/11 10/21/11  [provider]  spironolactone (ALDACTONE) 25 MG tablet Take 1/2 tablet by mouth once daily 12/11/10 10/21/11  Deboraha Sprang, MD    Allergies  Allergen Reactions  . Amiodarone Nausea And Vomiting    Only at higher dose.  Able to tolerate lower dose  . Dabigatran Other (See Comments)    Unknown, per pt   . Sulfonamide Derivatives     REACTION: hives    Physical Exam  Vitals  Blood pressure 137/77,  pulse 95, temperature (!) 95.1 F (35.1 C), temperature source Rectal, resp. rate 18, SpO2 90 %.  General appearance obtunded/encephalopathic, well developed female HEENT bilateral periorbital edema severe, right gaze deviation noted Neck supple, no neck vein distention Chest good air entry bilaterally, poor respiratory effort Heart irregularly irregular, no murmurs or gallops Abdomen soft, no significant tenderness Extremities +2 edema  bilaterally Neuro, could not elicit any response from the patient.  Patient is obtunded Skin no rashes or ulcers   Data Review  CBC Recent Labs  Lab 01/18/19 1646 01/18/19 1652  WBC 18.9*  --   HGB 16.4* 18.4*  HCT 51.7* 54.0*  PLT 272  --   MCV 81.4  --   MCH 25.8*  --   MCHC 31.7  --   RDW 13.7  --   LYMPHSABS 0.9  --   MONOABS 1.7*  --   EOSABS 0.0  --   BASOSABS 0.0  --    ------------------------------------------------------------------------------------------------------------------  Chemistries  Recent Labs  Lab 01/18/19 1646 01/18/19 1652  NA 137 135  K 5.8* 5.8*  CL 101 103  CO2 22  --   GLUCOSE 149* 146*  BUN 38* 51*  CREATININE 1.69* 1.40*  CALCIUM 9.8  --   AST 144*  --   ALT 42  --   ALKPHOS 117  --   BILITOT 1.5*  --    ------------------------------------------------------------------------------------------------------------------ CrCl cannot be calculated (Unknown ideal weight.). ------------------------------------------------------------------------------------------------------------------ No results for input(s): TSH, T4TOTAL, T3FREE, THYROIDAB in the last 72 hours.  Invalid input(s): FREET3   Coagulation profile Recent Labs  Lab 01/18/19 1646  INR 1.0   ------------------------------------------------------------------------------------------------------------------- No results for input(s): DDIMER in the last 72  hours. -------------------------------------------------------------------------------------------------------------------  Cardiac Enzymes No results for input(s): CKMB, TROPONINI, MYOGLOBIN in the last 168 hours.  Invalid input(s): CK ------------------------------------------------------------------------------------------------------------------ Invalid input(s): POCBNP   ---------------------------------------------------------------------------------------------------------------  Urinalysis    Component Value Date/Time   COLORURINE YELLOW 04/09/2013 2003   APPEARANCEUR CLEAR 04/09/2013 2003   LABSPEC 1.014 04/09/2013 2003   PHURINE 7.0 04/09/2013 2003   GLUCOSEU NEGATIVE 04/09/2013 2003   HGBUR NEGATIVE 04/09/2013 2003   BILIRUBINUR NEGATIVE 04/09/2013 2003   KETONESUR NEGATIVE 04/09/2013 2003   PROTEINUR NEGATIVE 04/09/2013 2003   UROBILINOGEN 0.2 04/09/2013 2003   NITRITE NEGATIVE 04/09/2013 2003   LEUKOCYTESUR MODERATE (A) 04/09/2013 2003    ----------------------------------------------------------------------------------------------------------------   Imaging results:   Ct Cervical Spine Wo Contrast  Result Date: 01/18/2019 CLINICAL DATA:  Code stroke. Initial evaluation for acute core stroke. Left-sided deficits. EXAM: CT HEAD WITHOUT CONTRAST CT CERVICAL SPINE WITHOUT CONTRAST TECHNIQUE: Multidetector CT imaging of the head and cervical spine was performed following the standard protocol without intravenous contrast. Multiplanar CT image reconstructions of the cervical spine were also generated. COMPARISON:  Prior CT from 08/25/2015. FINDINGS: CT HEAD FINDINGS Brain: Acute intraparenchymal hemorrhage centered at the right thalamus measures 3.8 x 3.8 x 5.0 cm (estimated volume 36 cc). Localized vasogenic edema with mild regional mass effect. Associated right-to-left midline shift measures up to 6 mm. Associated intraventricular extension with blood seen within the  lateral and third ventricles. Dilatation of the temporal horns of both lateral ventricles compatible with associated early hydrocephalus. No ventricular trapping. Basilar cisterns remain patent. No other acute intracranial hemorrhage. No other acute large vessel territory infarct. Underlying atrophy with advanced chronic microvascular ischemic disease. Scattered superimposed remote lacunar infarcts within the bilateral basal ganglia and left thalamus. No appreciable mass lesion. No extra-axial fluid collection. Vascular: No asymmetric hyperdense vessel. Scattered vascular calcifications noted within the carotid siphons. Skull: Diffuse soft tissue edema seen throughout the scalp and left greater than right face. Calvarium intact. Sinuses/Orbits: Globes and orbital soft tissues demonstrate no acute finding. Scattered mucosal thickening within the ethmoidal air cells. Small air-fluid levels noted within the right maxillary and left sphenoid sinuses. No mastoid effusion. Other: None. CT CERVICAL SPINE  FINDINGS Alignment: Straightening of the normal cervical lordosis. Grade 1 anterolisthesis of C2 on C3 and C7 on T1, with grade 1 retrolisthesis of C3 on C4 and C5 on C6. Findings likely chronic and facet mediated. Skull base and vertebrae: Skull base intact. Normal C1-2 articulations are preserved in the dens is intact. Vertebral body heights maintained. No acute fracture. Focal lucency extending through the left lateral mass of C2 on coronal image 32 favored to be chronic in nature. Soft tissues and spinal canal: Diffuse soft tissue edema extends from the scalp into the left lateral and posterior neck. Diffuse prevertebral edema, suspected to be related to the scalp edema. Prominent vascular calcifications about the carotid bifurcations. 8 mm hypodense right thyroid nodule noted, of doubtful significance. Spinal canal within normal limits. Disc levels: Moderate multilevel cervical spondylolysis at C3-4 through C7-T1. C4  and C5 vertebral bodies are largely ankylosed. Upper chest: Visualized upper chest demonstrates no acute finding. 4 mm nodule present at the right upper lobe (series 9, image 92). Few additional 4 mm nodules noted within the visualized peripheral left upper lobe, indeterminate. Partially visualized lungs are otherwise clear. Other: None. IMPRESSION: CT BRAIN: 1. Acute intraparenchymal hemorrhage centered at the right thalamus, estimated volume 36 cc, with associated regional mass effect and 6 mm right-to-left midline shift. 2. Associated intraventricular hemorrhage with evidence for early hydrocephalus. 3. Diffuse scalp edema extending into the left greater than right face. 4. Underlying atrophy with advanced chronic microvascular ischemic disease. CT CERVICAL SPINE: 1. No CT evidence for acute fracture or other definite traumatic injury within the cervical spine. 2. Diffuse soft tissue edema extending from the scalp into the left neck, with associated diffuse prevertebral edema. While the prevertebral edema is favored to be related to the diffuse soft tissue edema seen elsewhere within the head and neck, possible superimposed ligamentous injury could also have this appearance. If there is high clinical suspicion for possible occult ligamentous injury, then further evaluation with dedicated MRI would be warranted. 3. Moderate cervical spondylolysis at C3-4 through C7-T1. 4. Multiple scattered subcentimeter pulmonary nodules measuring up to 4 mm within the visualized upper lungs, indeterminate. No follow-up needed if patient is low-risk (and has no known or suspected primary neoplasm). Non-contrast chest CT can be considered in 12 months if patient is high-risk. This recommendation follows the consensus statement: Guidelines for Management of Incidental Pulmonary Nodules Detected on CT Images: From the Fleischner Society 2017; Radiology 2017; 284:228-243. These results were communicated to Dr. Lorraine Lax at [place]  6/22/2020by text page via the [facility] messaging system. Electronically Signed   By: Jeannine Boga M.D.   On: 01/18/2019 17:37   Ct Head Code Stroke Wo Contrast  Result Date: 01/18/2019 CLINICAL DATA:  Code stroke. Initial evaluation for acute core stroke. Left-sided deficits. EXAM: CT HEAD WITHOUT CONTRAST CT CERVICAL SPINE WITHOUT CONTRAST TECHNIQUE: Multidetector CT imaging of the head and cervical spine was performed following the standard protocol without intravenous contrast. Multiplanar CT image reconstructions of the cervical spine were also generated. COMPARISON:  Prior CT from 08/25/2015. FINDINGS: CT HEAD FINDINGS Brain: Acute intraparenchymal hemorrhage centered at the right thalamus measures 3.8 x 3.8 x 5.0 cm (estimated volume 36 cc). Localized vasogenic edema with mild regional mass effect. Associated right-to-left midline shift measures up to 6 mm. Associated intraventricular extension with blood seen within the lateral and third ventricles. Dilatation of the temporal horns of both lateral ventricles compatible with associated early hydrocephalus. No ventricular trapping. Basilar cisterns remain patent. No other  acute intracranial hemorrhage. No other acute large vessel territory infarct. Underlying atrophy with advanced chronic microvascular ischemic disease. Scattered superimposed remote lacunar infarcts within the bilateral basal ganglia and left thalamus. No appreciable mass lesion. No extra-axial fluid collection. Vascular: No asymmetric hyperdense vessel. Scattered vascular calcifications noted within the carotid siphons. Skull: Diffuse soft tissue edema seen throughout the scalp and left greater than right face. Calvarium intact. Sinuses/Orbits: Globes and orbital soft tissues demonstrate no acute finding. Scattered mucosal thickening within the ethmoidal air cells. Small air-fluid levels noted within the right maxillary and left sphenoid sinuses. No mastoid effusion. Other: None. CT  CERVICAL SPINE FINDINGS Alignment: Straightening of the normal cervical lordosis. Grade 1 anterolisthesis of C2 on C3 and C7 on T1, with grade 1 retrolisthesis of C3 on C4 and C5 on C6. Findings likely chronic and facet mediated. Skull base and vertebrae: Skull base intact. Normal C1-2 articulations are preserved in the dens is intact. Vertebral body heights maintained. No acute fracture. Focal lucency extending through the left lateral mass of C2 on coronal image 32 favored to be chronic in nature. Soft tissues and spinal canal: Diffuse soft tissue edema extends from the scalp into the left lateral and posterior neck. Diffuse prevertebral edema, suspected to be related to the scalp edema. Prominent vascular calcifications about the carotid bifurcations. 8 mm hypodense right thyroid nodule noted, of doubtful significance. Spinal canal within normal limits. Disc levels: Moderate multilevel cervical spondylolysis at C3-4 through C7-T1. C4 and C5 vertebral bodies are largely ankylosed. Upper chest: Visualized upper chest demonstrates no acute finding. 4 mm nodule present at the right upper lobe (series 9, image 92). Few additional 4 mm nodules noted within the visualized peripheral left upper lobe, indeterminate. Partially visualized lungs are otherwise clear. Other: None. IMPRESSION: CT BRAIN: 1. Acute intraparenchymal hemorrhage centered at the right thalamus, estimated volume 36 cc, with associated regional mass effect and 6 mm right-to-left midline shift. 2. Associated intraventricular hemorrhage with evidence for early hydrocephalus. 3. Diffuse scalp edema extending into the left greater than right face. 4. Underlying atrophy with advanced chronic microvascular ischemic disease. CT CERVICAL SPINE: 1. No CT evidence for acute fracture or other definite traumatic injury within the cervical spine. 2. Diffuse soft tissue edema extending from the scalp into the left neck, with associated diffuse prevertebral edema.  While the prevertebral edema is favored to be related to the diffuse soft tissue edema seen elsewhere within the head and neck, possible superimposed ligamentous injury could also have this appearance. If there is high clinical suspicion for possible occult ligamentous injury, then further evaluation with dedicated MRI would be warranted. 3. Moderate cervical spondylolysis at C3-4 through C7-T1. 4. Multiple scattered subcentimeter pulmonary nodules measuring up to 4 mm within the visualized upper lungs, indeterminate. No follow-up needed if patient is low-risk (and has no known or suspected primary neoplasm). Non-contrast chest CT can be considered in 12 months if patient is high-risk. This recommendation follows the consensus statement: Guidelines for Management of Incidental Pulmonary Nodules Detected on CT Images: From the Fleischner Society 2017; Radiology 2017; 284:228-243. These results were communicated to Dr. Lorraine Lax at [place] 6/22/2020by text page via the [facility] messaging system. Electronically Signed   By: Jeannine Boga M.D.   On: 01/18/2019 17:37    My personal review of EKG: A. fib with RVR at 113 bpm with PVCs    Assessment & Plan  Acute intraparenchymal hemorrhage centered in the right thalamus with mass-effect and 6 mm midline shift Neurology  on consult Xarelto put on hold Goals of care should be discussed in a.m. tried with the daughter today and she would make her only DNR but no comfort care is desired at this time.  She wants me to start her on IV fluids.  AFib with RVR On Xarelto, placed on hold Change blood pressure medications to IV  Hypertension, under control Lopressor IV   DVT SCDs  AM Labs Ordered, also please review Full Orders  Family Communication: Discussed with daughter [name] in the room.  Code Status DNR  Disposition Plan: To be determined  Time spent in minutes : 38 minutes  Condition GUARDED   @SIGNATURE @

## 2019-01-18 NOTE — ED Notes (Signed)
Unable to complete swallow screen due to pt being lethargic.

## 2019-01-18 NOTE — ED Notes (Signed)
ED TO INPATIENT HANDOFF REPORT  ED Nurse Name and Phone #: .   S Name/Age/Gender [name] 85 y.o. female Room/Bed: 037C/037C  Code Status   Code Status: Prior  Home/SNF/Other Home Patient oriented to: self Is this baseline? No   Triage Complete: Triage complete  Chief Complaint AMS  Triage Note Pt arrives via EMS from home alone found by children 1530 slumped over in chair. Left sided deficits.     Allergies Allergies  Allergen Reactions  . Amiodarone Nausea And Vomiting    Only at higher dose.  Able to tolerate lower dose  . Dabigatran Other (See Comments)    Unknown, per pt   . Sulfonamide Derivatives     REACTION: hives    Level of Care/Admitting Diagnosis ED Disposition    ED Disposition Condition [facility]: [place] [[id number]]  Level of Care: Telemetry Medical [104]  Covid Evaluation: N/A  Diagnosis: Intracerebral bleed [facility]) [[id number]]  Admitting Physician: Merton Border Marshal.Browner  Attending Physician: Laren Everts, ALI Marshal.Browner  Estimated length of stay: past midnight tomorrow  Certification:: I certify this patient will need inpatient services for at least 2 midnights  PT Class (Do Not Modify): Inpatient [101]  PT Acc Code (Do Not Modify): Private [1]       B Medical/Surgery History Past Medical History:  Diagnosis Date  . Anxiety   . Arthritis    "back; hands" (01/13/2015)  . First degree AV block   . Gastroesophageal reflux disease   . History of hiatal hernia   . Hx of echocardiogram    echo 07/2009:  mild focal basal septal hypertrophy, EF 55-60%, mild MR, mod LAE, mild RAE, PASP 45  . Hyperlipidemia   . Hypertension   . Hypothyroidism   . Persistent atrial fibrillation       . Seasonal allergies   . Squamous carcinoma 2016   "right leg; right jawline"  . Squamous cell skin cancer   . SVT (supraventricular tachycardia) (HCC)    possible atrial tachycardia, possible junctional tacycardia   Past  Surgical History:  Procedure Laterality Date  . ABDOMINAL HYSTERECTOMY  ~ 1975  . APPENDECTOMY  ~ 1975  . CARDIAC CATHETERIZATION  1990's  . CARDIOVERSION  01/13/2015   "didn't work"  . CARDIOVERSION N/A 01/13/2015   Procedure: CARDIOVERSION;  Surgeon: Josue Hector, MD;  Location: [facility] ENDOSCOPY;  Service: Cardiovascular;  Laterality: N/A;  . CARDIOVERSION N/A 06/21/2015   Procedure: CARDIOVERSION;  Surgeon: Larey Dresser, MD;  Location: [facility];  Service: Cardiovascular;  Laterality: N/A;  . CATARACT EXTRACTION W/ INTRAOCULAR LENS  IMPLANT, BILATERAL Bilateral 2011  . DILATION AND CURETTAGE OF UTERUS    . ELECTROPHYSIOLOGIC STUDY N/A 07/17/2015   Procedure: Cardioversion;  Surgeon: Deboraha Sprang, MD;  Location: [facility] CV LAB;  Service: Cardiovascular;  Laterality: N/A;  . KNEE ARTHROSCOPY Right 1990's  . LAPAROSCOPIC BILATERAL SALPINGO OOPHERECTOMY Bilateral ~ 2004  . SQUAMOUS CELL CARCINOMA EXCISION  2016   "right leg; right jawline"     A IV Location/Drains/Wounds Patient Lines/Drains/Airways Status   Active Line/Drains/Airways    None          Intake/Output Last 24 hours No intake or output data in the 24 hours ending 01/18/19 1933  Labs/Imaging Results for orders placed or performed during the hospital encounter of 01/18/19 (from the past 48 hour(s))  CBG monitoring, ED     Status: Abnormal   Collection Time: 01/18/19  4:44 PM  Result Value Ref Range   Glucose-Capillary 151 (H) 70 - 99 mg/dL  Ethanol     Status: None   Collection Time: 01/18/19  4:46 PM  Result Value Ref Range   Alcohol, Ethyl (B) <10 <10 mg/dL    Comment: (NOTE) Lowest detectable limit for serum alcohol is 10 mg/dL. For medical purposes only. Performed at [facility], [place] [address] [place], [place] [address]   Protime-INR     Status: None   Collection Time: 01/18/19  4:46 PM  Result Value Ref Range   Prothrombin Time 13.3 11.4 - 15.2 seconds   INR 1.0 0.8 - 1.2     Comment: (NOTE) INR goal varies based on device and disease states. Performed at [facility], [place] [address], [place], [place] [address]   APTT     Status: None   Collection Time: 01/18/19  4:46 PM  Result Value Ref Range   aPTT 30 24 - 36 seconds    Comment: Performed at [facility] [address] [place], [place] [address]  CBC     Status: Abnormal   Collection Time: 01/18/19  4:46 PM  Result Value Ref Range   WBC 18.9 (H) 4.0 - 10.5 K/uL   RBC 6.35 (H) 3.87 - 5.11 MIL/uL   Hemoglobin 16.4 (H) 12.0 - 15.0 g/dL   HCT 51.7 (H) 36.0 - 46.0 %   MCV 81.4 80.0 - 100.0 fL   MCH 25.8 (L) 26.0 - 34.0 pg   MCHC 31.7 30.0 - 36.0 g/dL   RDW 13.7 11.5 - 15.5 %   Platelets 272 150 - 400 K/uL   nRBC 0.0 0.0 - 0.2 %    Comment: Performed at [facility], [place] [address], [place], [place] [address]  Differential     Status: Abnormal   Collection Time: 01/18/19  4:46 PM  Result Value Ref Range   Neutrophils Relative % 85 %   Neutro Abs 16.2 (H) 1.7 - 7.7 K/uL   Lymphocytes Relative 5 %   Lymphs Abs 0.9 0.7 - 4.0 K/uL   Monocytes Relative 9 %   Monocytes Absolute 1.7 (H) 0.1 - 1.0 K/uL   Eosinophils Relative 0 %   Eosinophils Absolute 0.0 0.0 - 0.5 K/uL   Basophils Relative 0 %   Basophils Absolute 0.0 0.0 - 0.1 K/uL   Immature Granulocytes 1 %   Abs Immature Granulocytes 0.11 (H) 0.00 - 0.07 K/uL    Comment: Performed at [facility] [address] [place], [place] [address]  Comprehensive metabolic panel     Status: Abnormal   Collection Time: 01/18/19  4:46 PM  Result Value Ref Range   Sodium 137 135 - 145 mmol/L   Potassium 5.8 (H) 3.5 - 5.1 mmol/L   Chloride 101 98 - 111 mmol/L   CO2 22 22 - 32 mmol/L   Glucose, Bld 149 (H) 70 - 99 mg/dL   BUN 38 (H) 8 - 23 mg/dL   Creatinine, Ser 1.69 (H) 0.44 - 1.00 mg/dL   Calcium 9.8 8.9 - 10.3 mg/dL   Total Protein 7.0 6.5 - 8.1 g/dL   Albumin 4.1 3.5 - 5.0 g/dL   AST 144 (H) 15 - 41 U/L   ALT 42 0 - 44 U/L    Alkaline Phosphatase 117 38 - 126 U/L   Total Bilirubin 1.5 (H) 0.3 - 1.2 mg/dL   GFR calc non Af Amer 26 (  L) >60 mL/min   GFR calc Af Amer 30 (L) >60 mL/min   Anion gap 14 5 - 15    Comment: Performed at [facility] [address] [place], [place] [address]  I-stat chem 8, ED     Status: Abnormal   Collection Time: 01/18/19  4:52 PM  Result Value Ref Range   Sodium 135 135 - 145 mmol/L   Potassium 5.8 (H) 3.5 - 5.1 mmol/L   Chloride 103 98 - 111 mmol/L   BUN 51 (H) 8 - 23 mg/dL   Creatinine, Ser 1.40 (H) 0.44 - 1.00 mg/dL   Glucose, Bld 146 (H) 70 - 99 mg/dL   Calcium, Ion 1.08 (L) 1.15 - 1.40 mmol/L   TCO2 25 22 - 32 mmol/L   Hemoglobin 18.4 (H) 12.0 - 15.0 g/dL   HCT 54.0 (H) 36.0 - 46.0 %  SARS Coronavirus 2 (CEPHEID - Performed in [facility] hospital lab), Hosp Order     Status: None   Collection Time: 01/18/19  5:26 PM   Specimen: Nasopharyngeal Swab  Result Value Ref Range   SARS Coronavirus 2 NEGATIVE NEGATIVE    Comment: (NOTE) If result is NEGATIVE SARS-CoV-2 target nucleic acids are NOT DETECTED. The SARS-CoV-2 RNA is generally detectable in upper and lower  respiratory specimens during the acute phase of infection. The lowest  concentration of SARS-CoV-2 viral copies this assay can detect is 250  copies / mL. A negative result does not preclude SARS-CoV-2 infection  and should not be used as the sole basis for treatment or other  patient management decisions.  A negative result may occur with  improper specimen collection / handling, submission of specimen other  than nasopharyngeal swab, presence of viral mutation(s) within the  areas targeted by this assay, and inadequate number of viral copies  (<250 copies / mL). A negative result must be combined with clinical  observations, patient history, and epidemiological information. If result is POSITIVE SARS-CoV-2 target nucleic acids are DETECTED. The SARS-CoV-2 RNA is generally detectable in upper and  lower  respiratory specimens dur ing the acute phase of infection.  Positive  results are indicative of active infection with SARS-CoV-2.  Clinical  correlation with patient history and other diagnostic information is  necessary to determine patient infection status.  Positive results do  not rule out bacterial infection or co-infection with other viruses. If result is PRESUMPTIVE POSTIVE SARS-CoV-2 nucleic acids MAY BE PRESENT.   A presumptive positive result was obtained on the submitted specimen  and confirmed on repeat testing.  While 2019 novel coronavirus  (SARS-CoV-2) nucleic acids may be present in the submitted sample  additional confirmatory testing may be necessary for epidemiological  and / or clinical management purposes  to differentiate between  SARS-CoV-2 and other Sarbecovirus currently known to infect humans.  If clinically indicated additional testing with an alternate test  methodology [phone]) is advised. The SARS-CoV-2 RNA is generally  detectable in upper and lower respiratory sp ecimens during the acute  phase of infection. The expected result is Negative. Fact Sheet for Patients:  StrictlyIdeas.no Fact Sheet for Healthcare Providers: BankingDealers.co.za This test is not yet approved or cleared by the [place] FDA and has been authorized for detection and/or diagnosis of SARS-CoV-2 by FDA under an Emergency Use Authorization (EUA).  This EUA will remain in effect (meaning this test can be used) for the duration of the COVID-19 declaration under Section 564(b)(1) of the Act, 21 U.S.C. section 360bbb-3(b)(1),  unless the authorization is terminated or revoked sooner. Performed at [facility], [place] [address], [place], [place] [address]    Ct Cervical Spine Wo Contrast  Result Date: 01/18/2019 CLINICAL DATA:  Code stroke. Initial evaluation for acute core stroke. Left-sided deficits. EXAM: CT HEAD  WITHOUT CONTRAST CT CERVICAL SPINE WITHOUT CONTRAST TECHNIQUE: Multidetector CT imaging of the head and cervical spine was performed following the standard protocol without intravenous contrast. Multiplanar CT image reconstructions of the cervical spine were also generated. COMPARISON:  Prior CT from 08/25/2015. FINDINGS: CT HEAD FINDINGS Brain: Acute intraparenchymal hemorrhage centered at the right thalamus measures 3.8 x 3.8 x 5.0 cm (estimated volume 36 cc). Localized vasogenic edema with mild regional mass effect. Associated right-to-left midline shift measures up to 6 mm. Associated intraventricular extension with blood seen within the lateral and third ventricles. Dilatation of the temporal horns of both lateral ventricles compatible with associated early hydrocephalus. No ventricular trapping. Basilar cisterns remain patent. No other acute intracranial hemorrhage. No other acute large vessel territory infarct. Underlying atrophy with advanced chronic microvascular ischemic disease. Scattered superimposed remote lacunar infarcts within the bilateral basal ganglia and left thalamus. No appreciable mass lesion. No extra-axial fluid collection. Vascular: No asymmetric hyperdense vessel. Scattered vascular calcifications noted within the carotid siphons. Skull: Diffuse soft tissue edema seen throughout the scalp and left greater than right face. Calvarium intact. Sinuses/Orbits: Globes and orbital soft tissues demonstrate no acute finding. Scattered mucosal thickening within the ethmoidal air cells. Small air-fluid levels noted within the right maxillary and left sphenoid sinuses. No mastoid effusion. Other: None. CT CERVICAL SPINE FINDINGS Alignment: Straightening of the normal cervical lordosis. Grade 1 anterolisthesis of C2 on C3 and C7 on T1, with grade 1 retrolisthesis of C3 on C4 and C5 on C6. Findings likely chronic and facet mediated. Skull base and vertebrae: Skull base intact. Normal C1-2 articulations  are preserved in the dens is intact. Vertebral body heights maintained. No acute fracture. Focal lucency extending through the left lateral mass of C2 on coronal image 32 favored to be chronic in nature. Soft tissues and spinal canal: Diffuse soft tissue edema extends from the scalp into the left lateral and posterior neck. Diffuse prevertebral edema, suspected to be related to the scalp edema. Prominent vascular calcifications about the carotid bifurcations. 8 mm hypodense right thyroid nodule noted, of doubtful significance. Spinal canal within normal limits. Disc levels: Moderate multilevel cervical spondylolysis at C3-4 through C7-T1. C4 and C5 vertebral bodies are largely ankylosed. Upper chest: Visualized upper chest demonstrates no acute finding. 4 mm nodule present at the right upper lobe (series 9, image 92). Few additional 4 mm nodules noted within the visualized peripheral left upper lobe, indeterminate. Partially visualized lungs are otherwise clear. Other: None. IMPRESSION: CT BRAIN: 1. Acute intraparenchymal hemorrhage centered at the right thalamus, estimated volume 36 cc, with associated regional mass effect and 6 mm right-to-left midline shift. 2. Associated intraventricular hemorrhage with evidence for early hydrocephalus. 3. Diffuse scalp edema extending into the left greater than right face. 4. Underlying atrophy with advanced chronic microvascular ischemic disease. CT CERVICAL SPINE: 1. No CT evidence for acute fracture or other definite traumatic injury within the cervical spine. 2. Diffuse soft tissue edema extending from the scalp into the left neck, with associated diffuse prevertebral edema. While the prevertebral edema is favored to be related to the diffuse soft tissue edema seen elsewhere within the head and neck, possible superimposed ligamentous injury could also have this appearance. If there  is high clinical suspicion for possible occult ligamentous injury, then further evaluation  with dedicated MRI would be warranted. 3. Moderate cervical spondylolysis at C3-4 through C7-T1. 4. Multiple scattered subcentimeter pulmonary nodules measuring up to 4 mm within the visualized upper lungs, indeterminate. No follow-up needed if patient is low-risk (and has no known or suspected primary neoplasm). Non-contrast chest CT can be considered in 12 months if patient is high-risk. This recommendation follows the consensus statement: Guidelines for Management of Incidental Pulmonary Nodules Detected on CT Images: From the Fleischner Society 2017; Radiology 2017; 284:228-243. These results were communicated to Dr. Lorraine Lax at [place] 6/22/2020by text page via the [facility] messaging system. Electronically Signed   By: Jeannine Boga M.D.   On: 01/18/2019 17:37   Ct Head Code Stroke Wo Contrast  Result Date: 01/18/2019 CLINICAL DATA:  Code stroke. Initial evaluation for acute core stroke. Left-sided deficits. EXAM: CT HEAD WITHOUT CONTRAST CT CERVICAL SPINE WITHOUT CONTRAST TECHNIQUE: Multidetector CT imaging of the head and cervical spine was performed following the standard protocol without intravenous contrast. Multiplanar CT image reconstructions of the cervical spine were also generated. COMPARISON:  Prior CT from 08/25/2015. FINDINGS: CT HEAD FINDINGS Brain: Acute intraparenchymal hemorrhage centered at the right thalamus measures 3.8 x 3.8 x 5.0 cm (estimated volume 36 cc). Localized vasogenic edema with mild regional mass effect. Associated right-to-left midline shift measures up to 6 mm. Associated intraventricular extension with blood seen within the lateral and third ventricles. Dilatation of the temporal horns of both lateral ventricles compatible with associated early hydrocephalus. No ventricular trapping. Basilar cisterns remain patent. No other acute intracranial hemorrhage. No other acute large vessel territory infarct. Underlying atrophy with advanced chronic microvascular ischemic  disease. Scattered superimposed remote lacunar infarcts within the bilateral basal ganglia and left thalamus. No appreciable mass lesion. No extra-axial fluid collection. Vascular: No asymmetric hyperdense vessel. Scattered vascular calcifications noted within the carotid siphons. Skull: Diffuse soft tissue edema seen throughout the scalp and left greater than right face. Calvarium intact. Sinuses/Orbits: Globes and orbital soft tissues demonstrate no acute finding. Scattered mucosal thickening within the ethmoidal air cells. Small air-fluid levels noted within the right maxillary and left sphenoid sinuses. No mastoid effusion. Other: None. CT CERVICAL SPINE FINDINGS Alignment: Straightening of the normal cervical lordosis. Grade 1 anterolisthesis of C2 on C3 and C7 on T1, with grade 1 retrolisthesis of C3 on C4 and C5 on C6. Findings likely chronic and facet mediated. Skull base and vertebrae: Skull base intact. Normal C1-2 articulations are preserved in the dens is intact. Vertebral body heights maintained. No acute fracture. Focal lucency extending through the left lateral mass of C2 on coronal image 32 favored to be chronic in nature. Soft tissues and spinal canal: Diffuse soft tissue edema extends from the scalp into the left lateral and posterior neck. Diffuse prevertebral edema, suspected to be related to the scalp edema. Prominent vascular calcifications about the carotid bifurcations. 8 mm hypodense right thyroid nodule noted, of doubtful significance. Spinal canal within normal limits. Disc levels: Moderate multilevel cervical spondylolysis at C3-4 through C7-T1. C4 and C5 vertebral bodies are largely ankylosed. Upper chest: Visualized upper chest demonstrates no acute finding. 4 mm nodule present at the right upper lobe (series 9, image 92). Few additional 4 mm nodules noted within the visualized peripheral left upper lobe, indeterminate. Partially visualized lungs are otherwise clear. Other: None.  IMPRESSION: CT BRAIN: 1. Acute intraparenchymal hemorrhage centered at the right thalamus, estimated volume 36 cc, with associated regional  mass effect and 6 mm right-to-left midline shift. 2. Associated intraventricular hemorrhage with evidence for early hydrocephalus. 3. Diffuse scalp edema extending into the left greater than right face. 4. Underlying atrophy with advanced chronic microvascular ischemic disease. CT CERVICAL SPINE: 1. No CT evidence for acute fracture or other definite traumatic injury within the cervical spine. 2. Diffuse soft tissue edema extending from the scalp into the left neck, with associated diffuse prevertebral edema. While the prevertebral edema is favored to be related to the diffuse soft tissue edema seen elsewhere within the head and neck, possible superimposed ligamentous injury could also have this appearance. If there is high clinical suspicion for possible occult ligamentous injury, then further evaluation with dedicated MRI would be warranted. 3. Moderate cervical spondylolysis at C3-4 through C7-T1. 4. Multiple scattered subcentimeter pulmonary nodules measuring up to 4 mm within the visualized upper lungs, indeterminate. No follow-up needed if patient is low-risk (and has no known or suspected primary neoplasm). Non-contrast chest CT can be considered in 12 months if patient is high-risk. This recommendation follows the consensus statement: Guidelines for Management of Incidental Pulmonary Nodules Detected on CT Images: From the Fleischner Society 2017; Radiology 2017; 284:228-243. These results were communicated to Dr. Lorraine Lax at [place] 6/22/2020by text page via the [facility] messaging system. Electronically Signed   By: Jeannine Boga M.D.   On: 01/18/2019 17:37    Pending Labs Unresulted Labs (From admission, onward)    Start     Ordered   01/18/19 1730  CK  Add-on,   AD     01/18/19 1729   01/18/19 1646  Urine rapid drug screen (hosp performed)  ONCE - STAT,    STAT     01/18/19 1646   01/18/19 1646  Urinalysis, Routine w reflex microscopic  ONCE - STAT,   STAT     01/18/19 1646   Signed and Held  Potassium  Once-Timed,   R     Signed and Held          Vitals/Pain Today's Vitals   01/18/19 1830 01/18/19 1845 01/18/19 1900 01/18/19 1902  BP: (!) 148/94 134/87  136/89  Pulse:      Resp: 20 20  20   Temp:      TempSrc:      SpO2:   99%     Isolation Precautions No active isolations  Medications Medications - No data to display  Mobility non-ambulatory High fall risk   Focused Assessments .    R Recommendations: See Admitting Provider Note  Report given to:   Additional Notes: .

## 2019-01-18 NOTE — Consult Note (Signed)
Requesting Physician: Dr. Tamera Punt    Chief Complaint: Found unresponsive, headache prior day  History obtained from: Patient and Chart     HPI:                                                                                                                                       [name] is a 85 y.o. female with past medical history of atrial fibrillation on Xarelto, hypertension, hyperlipidemia, hypothyroidism brought to [facility] emergency room by EMS after daughter found her slumped over in the chair at the kitchen table.  Last known normal was 6 PM when her daughter, who is her POA last saw her.  She was complaining of the headache since Friday.  Today at 3:30 PM when daughter did not hear from her she went to check on the patient and found her slumped over in her chair in front of her dinner.  Her pills were all over the floor.  EMS was called and patient noted to have left-sided weakness and brought to [facility].  On assessment patient was plegic in her left arm and leg, eyes were swollen.  Upon opening them, she had a forced right gaze deviation.  She was lethargic, but would arouse to noxious stimulus and follows some commands and answer questions intermittently.  Stat head CT was performed which showed large right thalamic hemorrhage of about 30 cc with intraventricular extension and mild midline shift.  Patient was on Xarelto however suspected she missed her dose last night as pills were on the floor, making her last dose around 48 hours.  Spoke to the daughter over the phone who stated patient is DNR/DNI.  Prior to her stroke, patient was functionally independent and lives by herself.  Date last known well: 6.21.20 Time last known well: 6pm tPA Given: no, hemorrhage NIHSS: 27 Baseline MRS 0  Intracerebral Hemorrhage (ICH) Score  Glascow Coma Score  5-12 +1  Age >/= 80 yes +1  ICH volume >/= 48ml  yes +1  IVH yes +1  Infratentorial origin yes no 0 Total:  4   Past  Medical History:  Diagnosis Date  . Anxiety   . Arthritis    "back; hands" (01/13/2015)  . First degree AV block   . Gastroesophageal reflux disease   . History of hiatal hernia   . Hx of echocardiogram    echo 07/2009:  mild focal basal septal hypertrophy, EF 55-60%, mild MR, mod LAE, mild RAE, PASP 45  . Hyperlipidemia   . Hypertension   . Hypothyroidism   . Persistent atrial fibrillation       . Seasonal allergies   . Squamous carcinoma 2016   "right leg; right jawline"  . Squamous cell skin cancer   . SVT (supraventricular tachycardia) (HCC)    possible atrial tachycardia, possible junctional tacycardia    Past Surgical History:  Procedure Laterality Date  .  ABDOMINAL HYSTERECTOMY  ~ 1975  . APPENDECTOMY  ~ 1975  . CARDIAC CATHETERIZATION  1990's  . CARDIOVERSION  01/13/2015   "didn't work"  . CARDIOVERSION N/A 01/13/2015   Procedure: CARDIOVERSION;  Surgeon: Josue Hector, MD;  Location: [facility] ENDOSCOPY;  Service: Cardiovascular;  Laterality: N/A;  . CARDIOVERSION N/A 06/21/2015   Procedure: CARDIOVERSION;  Surgeon: Larey Dresser, MD;  Location: [place];  Service: Cardiovascular;  Laterality: N/A;  . CATARACT EXTRACTION W/ INTRAOCULAR LENS  IMPLANT, BILATERAL Bilateral 2011  . DILATION AND CURETTAGE OF UTERUS    . ELECTROPHYSIOLOGIC STUDY N/A 07/17/2015   Procedure: Cardioversion;  Surgeon: Deboraha Sprang, MD;  Location: [facility];  Service: Cardiovascular;  Laterality: N/A;  . KNEE ARTHROSCOPY Right 1990's  . LAPAROSCOPIC BILATERAL SALPINGO OOPHERECTOMY Bilateral ~ 2004  . SQUAMOUS CELL CARCINOMA EXCISION  2016   "right leg; right jawline"    Family History  Problem Relation Age of Onset  . Heart disease Father    Social History:  reports that she has never smoked. She has never used smokeless tobacco. She reports that she does not drink alcohol or use drugs.  Allergies:  Allergies  Allergen Reactions  . Amiodarone Nausea And Vomiting    Only at  higher dose.  Able to tolerate lower dose  . Dabigatran Other (See Comments)    Unknown, per pt   . Sulfonamide Derivatives     REACTION: hives    Medications:                                                                                                                        I reviewed home medications  ROS:                                                                                                                                     14 systems reviewed and negative except above    Examination:  General: Appears well-developed Psych: lethargic Eyes: periorbital edema in both eyes HENT: No OP obstrucion, c-collar in place Head: Normocephalic.  Cardiovascular: pulse,  irregularly irregular rate and rhythm  Respiratory: Slightly increased labored breathing GI: Soft.  No distension. There is no tenderness.  Skin: WDI    Neurological Examination Mental Status: Lethargic, answers simple questions such as her name after giving sternal rub.  Follows simple commands such as showing thumbs up intermittently.  Dysarthric speech Cranial Nerves: II: Visual fields: Unable to assess due to mental status, both eyes closed III,IV, VI: ptosis+, forced gaze deviation to the right side VII: Left facial droop XII: midline tongue extension Motor: Right : Upper extremity   3/5    Left:     Upper extremity   0/5  Lower extremity   3/5     Lower extremity   0/5 Tone and bulk:normal tone throughout; Sensory: Does not grimace to pain when given noxious stimulus on the left side, withdraws to pain on the right side.  Neglect left side Plantars: Right: downgoing   Left: downgoing Cerebellar: Unable to assess due to decreased mental status Gait: Unable to walk     Lab Results: Basic Metabolic Panel: Recent Labs  Lab 01/18/19 1646 01/18/19 1652  NA 137 135  K 5.8* 5.8*  CL 101 103  CO2  22  --   GLUCOSE 149* 146*  BUN 38* 51*  CREATININE 1.69* 1.40*  CALCIUM 9.8  --     CBC: Recent Labs  Lab 01/18/19 1646 01/18/19 1652  WBC 18.9*  --   NEUTROABS 16.2*  --   HGB 16.4* 18.4*  HCT 51.7* 54.0*  MCV 81.4  --   PLT 272  --     Coagulation Studies: Recent Labs    01/18/19 1646  LABPROT 13.3  INR 1.0    Imaging: Ct Cervical Spine Wo Contrast  Result Date: 01/18/2019 CLINICAL DATA:  Code stroke. Initial evaluation for acute core stroke. Left-sided deficits. EXAM: CT HEAD WITHOUT CONTRAST CT CERVICAL SPINE WITHOUT CONTRAST TECHNIQUE: Multidetector CT imaging of the head and cervical spine was performed following the standard protocol without intravenous contrast. Multiplanar CT image reconstructions of the cervical spine were also generated. COMPARISON:  Prior CT from 08/25/2015. FINDINGS: CT HEAD FINDINGS Brain: Acute intraparenchymal hemorrhage centered at the right thalamus measures 3.8 x 3.8 x 5.0 cm (estimated volume 36 cc). Localized vasogenic edema with mild regional mass effect. Associated right-to-left midline shift measures up to 6 mm. Associated intraventricular extension with blood seen within the lateral and third ventricles. Dilatation of the temporal horns of both lateral ventricles compatible with associated early hydrocephalus. No ventricular trapping. Basilar cisterns remain patent. No other acute intracranial hemorrhage. No other acute large vessel territory infarct. Underlying atrophy with advanced chronic microvascular ischemic disease. Scattered superimposed remote lacunar infarcts within the bilateral basal ganglia and left thalamus. No appreciable mass lesion. No extra-axial fluid collection. Vascular: No asymmetric hyperdense vessel. Scattered vascular calcifications noted within the carotid siphons. Skull: Diffuse soft tissue edema seen throughout the scalp and left greater than right face. Calvarium intact. Sinuses/Orbits: Globes and orbital soft  tissues demonstrate no acute finding. Scattered mucosal thickening within the ethmoidal air cells. Small air-fluid levels noted within the right maxillary and left sphenoid sinuses. No mastoid effusion. Other: None. CT CERVICAL SPINE FINDINGS Alignment: Straightening of the normal cervical lordosis. Grade 1 anterolisthesis of C2 on C3 and C7 on T1, with grade 1 retrolisthesis of C3 on C4 and C5 on  C6. Findings likely chronic and facet mediated. Skull base and vertebrae: Skull base intact. Normal C1-2 articulations are preserved in the dens is intact. Vertebral body heights maintained. No acute fracture. Focal lucency extending through the left lateral mass of C2 on coronal image 32 favored to be chronic in nature. Soft tissues and spinal canal: Diffuse soft tissue edema extends from the scalp into the left lateral and posterior neck. Diffuse prevertebral edema, suspected to be related to the scalp edema. Prominent vascular calcifications about the carotid bifurcations. 8 mm hypodense right thyroid nodule noted, of doubtful significance. Spinal canal within normal limits. Disc levels: Moderate multilevel cervical spondylolysis at C3-4 through C7-T1. C4 and C5 vertebral bodies are largely ankylosed. Upper chest: Visualized upper chest demonstrates no acute finding. 4 mm nodule present at the right upper lobe (series 9, image 92). Few additional 4 mm nodules noted within the visualized peripheral left upper lobe, indeterminate. Partially visualized lungs are otherwise clear. Other: None. IMPRESSION: CT BRAIN: 1. Acute intraparenchymal hemorrhage centered at the right thalamus, estimated volume 36 cc, with associated regional mass effect and 6 mm right-to-left midline shift. 2. Associated intraventricular hemorrhage with evidence for early hydrocephalus. 3. Diffuse scalp edema extending into the left greater than right face. 4. Underlying atrophy with advanced chronic microvascular ischemic disease. CT CERVICAL SPINE:  1. No CT evidence for acute fracture or other definite traumatic injury within the cervical spine. 2. Diffuse soft tissue edema extending from the scalp into the left neck, with associated diffuse prevertebral edema. While the prevertebral edema is favored to be related to the diffuse soft tissue edema seen elsewhere within the head and neck, possible superimposed ligamentous injury could also have this appearance. If there is high clinical suspicion for possible occult ligamentous injury, then further evaluation with dedicated MRI would be warranted. 3. Moderate cervical spondylolysis at C3-4 through C7-T1. 4. Multiple scattered subcentimeter pulmonary nodules measuring up to 4 mm within the visualized upper lungs, indeterminate. No follow-up needed if patient is low-risk (and has no known or suspected primary neoplasm). Non-contrast chest CT can be considered in 12 months if patient is high-risk. This recommendation follows the consensus statement: Guidelines for Management of Incidental Pulmonary Nodules Detected on CT Images: From the Fleischner Society 2017; Radiology 2017; 284:228-243. These results were communicated to Dr. Lorraine Lax at [place] 6/22/2020by text page via the [facility] messaging system. Electronically Signed   By: Jeannine Boga M.D.   On: 01/18/2019 17:37   Ct Head Code Stroke Wo Contrast  Result Date: 01/18/2019 CLINICAL DATA:  Code stroke. Initial evaluation for acute core stroke. Left-sided deficits. EXAM: CT HEAD WITHOUT CONTRAST CT CERVICAL SPINE WITHOUT CONTRAST TECHNIQUE: Multidetector CT imaging of the head and cervical spine was performed following the standard protocol without intravenous contrast. Multiplanar CT image reconstructions of the cervical spine were also generated. COMPARISON:  Prior CT from 08/25/2015. FINDINGS: CT HEAD FINDINGS Brain: Acute intraparenchymal hemorrhage centered at the right thalamus measures 3.8 x 3.8 x 5.0 cm (estimated volume 36 cc). Localized  vasogenic edema with mild regional mass effect. Associated right-to-left midline shift measures up to 6 mm. Associated intraventricular extension with blood seen within the lateral and third ventricles. Dilatation of the temporal horns of both lateral ventricles compatible with associated early hydrocephalus. No ventricular trapping. Basilar cisterns remain patent. No other acute intracranial hemorrhage. No other acute large vessel territory infarct. Underlying atrophy with advanced chronic microvascular ischemic disease. Scattered superimposed remote lacunar infarcts within the bilateral basal ganglia and left  thalamus. No appreciable mass lesion. No extra-axial fluid collection. Vascular: No asymmetric hyperdense vessel. Scattered vascular calcifications noted within the carotid siphons. Skull: Diffuse soft tissue edema seen throughout the scalp and left greater than right face. Calvarium intact. Sinuses/Orbits: Globes and orbital soft tissues demonstrate no acute finding. Scattered mucosal thickening within the ethmoidal air cells. Small air-fluid levels noted within the right maxillary and left sphenoid sinuses. No mastoid effusion. Other: None. CT CERVICAL SPINE FINDINGS Alignment: Straightening of the normal cervical lordosis. Grade 1 anterolisthesis of C2 on C3 and C7 on T1, with grade 1 retrolisthesis of C3 on C4 and C5 on C6. Findings likely chronic and facet mediated. Skull base and vertebrae: Skull base intact. Normal C1-2 articulations are preserved in the dens is intact. Vertebral body heights maintained. No acute fracture. Focal lucency extending through the left lateral mass of C2 on coronal image 32 favored to be chronic in nature. Soft tissues and spinal canal: Diffuse soft tissue edema extends from the scalp into the left lateral and posterior neck. Diffuse prevertebral edema, suspected to be related to the scalp edema. Prominent vascular calcifications about the carotid bifurcations. 8 mm  hypodense right thyroid nodule noted, of doubtful significance. Spinal canal within normal limits. Disc levels: Moderate multilevel cervical spondylolysis at C3-4 through C7-T1. C4 and C5 vertebral bodies are largely ankylosed. Upper chest: Visualized upper chest demonstrates no acute finding. 4 mm nodule present at the right upper lobe (series 9, image 92). Few additional 4 mm nodules noted within the visualized peripheral left upper lobe, indeterminate. Partially visualized lungs are otherwise clear. Other: None. IMPRESSION: CT BRAIN: 1. Acute intraparenchymal hemorrhage centered at the right thalamus, estimated volume 36 cc, with associated regional mass effect and 6 mm right-to-left midline shift. 2. Associated intraventricular hemorrhage with evidence for early hydrocephalus. 3. Diffuse scalp edema extending into the left greater than right face. 4. Underlying atrophy with advanced chronic microvascular ischemic disease. CT CERVICAL SPINE: 1. No CT evidence for acute fracture or other definite traumatic injury within the cervical spine. 2. Diffuse soft tissue edema extending from the scalp into the left neck, with associated diffuse prevertebral edema. While the prevertebral edema is favored to be related to the diffuse soft tissue edema seen elsewhere within the head and neck, possible superimposed ligamentous injury could also have this appearance. If there is high clinical suspicion for possible occult ligamentous injury, then further evaluation with dedicated MRI would be warranted. 3. Moderate cervical spondylolysis at C3-4 through C7-T1. 4. Multiple scattered subcentimeter pulmonary nodules measuring up to 4 mm within the visualized upper lungs, indeterminate. No follow-up needed if patient is low-risk (and has no known or suspected primary neoplasm). Non-contrast chest CT can be considered in 12 months if patient is high-risk. This recommendation follows the consensus statement: Guidelines for Management  of Incidental Pulmonary Nodules Detected on CT Images: From the Fleischner Society 2017; Radiology 2017; 284:228-243. These results were communicated to Dr. Lorraine Lax at [place] 6/22/2020by text page via the [facility] messaging system. Electronically Signed   By: Jeannine Boga M.D.   On: 01/18/2019 17:37     ASSESSMENT AND PLAN  85 y.o. female with past medical history of atrial fibrillation on Xarelto, hypertension, hyperlipidemia, hypothyroidism [facility] emergency room by EMS after daughter found her slumped over in the chair at the kitchen table.  Last dose of Xarelto likely 48 hours ago.  CT head shows large right thalamic hemorrhage with intraventricular extension. Discussed with daughter regarding poor prognosis  and the patient will likely be severely disabled and bedbound.  Daughter who is POA expressed that patient is DNR/DNI and goal would be for her to be comfortable.  Recommend admission to medicine floor and palliative care consult    Large right thalamic hemorrhage with intraventricular extension and midline shift ICH score 4  Recommendations Hold anticoagulation/antiplatelets No aggressive measures such as intubation After discussion with daughter, focus is to  Keep patient comfortable BP goal less than 140/90.  PRN labetalol N.p.o. until swallow evaluation Palliative care consult     This patient is neurologically critically ill due to [place].  She is at risk for significant risk of neurological worsening from cerebral edema,  death from brain herniation, heart failure,  infection, respiratory failure and seizure and high probability of death. This patient's care requires constant monitoring of vital signs, hemodynamics, respiratory and cardiac monitoring, review of multiple databases, neurological assessmentother specialists and medical decision making of high complexity.  Goals of care discussion had over the phone with daughter who is POA, goal is to keep her  comfortable  I spent 55  minutes of neurocritical time in the care of this patient.     Eyvonne Burchfield [facility] Pager Number [phone]

## 2019-01-18 NOTE — Code Documentation (Signed)
85yo female arriving to [facility] via [place] at 1634. Patient from home where she was [place] on 01/17/2019 at 1800. She reported a severe headache to her family on Friday and did not attend a family dinner yesterday. Family last spoke to her around 1800. Today she was found in the kitchen chair by family. EMS was called and patient was transported to the ED. Code stroke called in the ED. Stroke team responded to the patient in CT. CT completed showing ICH. Of note, patient with a h/o atrial fibrillation on Xarelto. Patient's pills were reportedly all in the floor at the time she was found today. Suspect patient missed Xarelto dose last night. NIHSS 27, see documentation for details and code stroke times. Patient with eyes swollen shut, significant facial swelling and collar in place on exam. Patient appears to have right gaze and left hemiplegia on exam. Patient answers intermittent questions and follows intermittent simple commands on exam. SBP < 140 at this time. No acute stroke treatment at this time. Bedside handoff with ED RN Deidre Ala.

## 2019-01-18 NOTE — ED Notes (Signed)
Family at bedside. 

## 2019-01-18 NOTE — ED Notes (Signed)
PA approved for C collar to be removed.

## 2019-01-18 NOTE — ED Triage Notes (Signed)
Pt arrives via EMS from home alone found by children 1530 slumped over in chair. Left sided deficits.

## 2019-01-18 NOTE — ED Notes (Signed)
[facility] called to activate code stroke 

## 2019-01-19 DIAGNOSIS — I10 Essential (primary) hypertension: Secondary | ICD-10-CM

## 2019-01-19 DIAGNOSIS — I629 Nontraumatic intracranial hemorrhage, unspecified: Secondary | ICD-10-CM

## 2019-01-19 DIAGNOSIS — Z515 Encounter for palliative care: Secondary | ICD-10-CM

## 2019-01-19 MED ORDER — MORPHINE SULFATE (PF) 2 MG/ML IV SOLN
1.0000 mg | INTRAVENOUS | Status: DC | PRN
  Filled 2019-01-19: qty 1

## 2019-01-19 MED ORDER — GLYCOPYRROLATE 0.2 MG/ML IJ SOLN
0.2000 mg | INTRAMUSCULAR | Status: DC | PRN
  Administered 2019-01-19: 0.2 mg via INTRAVENOUS
  Filled 2019-01-19: qty 1

## 2019-01-19 MED ORDER — LORAZEPAM 2 MG/ML PO CONC: 0.5000 mg | ORAL | Status: DC | PRN

## 2019-01-19 MED ORDER — MORPHINE SULFATE (PF) 2 MG/ML IV SOLN: 1.0000 mg | INTRAVENOUS | Status: DC | PRN

## 2019-01-19 MED ORDER — ATROPINE SULFATE 1 % OP SOLN
1.0000 [drp] | Freq: Four times a day (QID) | OPHTHALMIC | Status: DC | PRN
  Administered 2019-01-19 – 2019-01-21 (×4): 2 [drp] via SUBLINGUAL
  Filled 2019-01-19: qty 2

## 2019-01-19 MED ORDER — MORPHINE SULFATE (CONCENTRATE) 10 MG/0.5ML PO SOLN
5.0000 mg | ORAL | Status: DC | PRN
  Administered 2019-01-19: 10 mg via SUBLINGUAL
  Administered 2019-01-20: 5 mg via SUBLINGUAL
  Administered 2019-01-20: 10 mg via SUBLINGUAL
  Filled 2019-01-19 (×3): qty 0.5

## 2019-01-19 MED ORDER — LORAZEPAM 2 MG/ML IJ SOLN: 0.5000 mg | INTRAMUSCULAR | Status: DC | PRN

## 2019-01-19 NOTE — Progress Notes (Signed)
Received page from RN reporting loss of IV access. Oral/sublingual comfort medications initiated.   NO CHARGE  Ihor Dow, [place], FNP-C Palliative Medicine Team  Phone: [phone] Fax: [phone]

## 2019-01-19 NOTE — Plan of Care (Signed)
  Problem: Education: Goal: Knowledge of disease or condition will improve Outcome: Not Applicable

## 2019-01-19 NOTE — Progress Notes (Signed)
4D03 -- Manufacturing engineer [facility]) [place] Note  Received request from Jacqualin Combes, [facility] for family interest in [facility]. Unfortunately, [employer] [facility] is not able to offer a room today. Spoke with daughter, [name], she is aware that [facility] liaison will follow up with [facility] and family tomorrow or sooner if room becomes available.  Please do not hesitate to call with any questions.  Thank you for this referral.  Margaretmary Eddy, RN, BSN [facility] Liaison  [place] are on AM[facility] listed under [facility]osp[facility] and [place]

## 2019-01-19 NOTE — Progress Notes (Signed)
SLP Cancellation Note  Patient Details Name: [name] MRN: [id number] DOB: 1924/08/17   Cancelled treatment:       Reason Eval/Treat Not Completed: Other (comment) Pt's case was discussed with [name], RN and she reported that the pt's family is at bedside and have decided on comfort care with MD.   Tobie Poet I. Hardin Negus, [place], [place] Office number [phone] Pager [place] 01/19/2019, 11:02 AM

## 2019-01-19 NOTE — TOC Initial Note (Signed)
Transition of Care [facility]) - Initial/Assessment Note    Patient Details  Name: [name] MRN: [id number] Date of Birth: 11-09-1924  Transition of Care [facility]) CM/SW Contact:    Pollie Friar, RN Phone Number: 01/19/2019, 1:17 PM  Clinical Narrative:                 [facility] consulted for [facility]. TOC met with the patient and her daughter: [name]: [phone] [facility]. [name] states her father passed at [place] and they want her mother to be there also.  TOC called Anderson Malta with [facility] and made the referral. Anderson Malta to f/u with [name]. [facility] doesn't have any beds today but not sure about tomorrow. TOC following.  Expected Discharge Plan: Hospice Medical Facility Barriers to Discharge: Hospice Bed not available   Patient Goals and CMS Choice   CMS Medicare.gov Compare Post Acute Care list provided to:: Patient Represenative (must comment)(daughter: [name]) Choice offered to / list presented to : Adult Children  Expected Discharge Plan and Services Expected Discharge Plan: [place]                                              Prior Living Arrangements/Services   Lives with:: Self                   Activities of Daily Living Home Assistive Devices/Equipment: Gilford Rile (specify type) ADL Screening (condition at time of admission) Patient's cognitive ability adequate to safely complete daily activities?: No Is the patient deaf or have difficulty hearing?: Yes Does the patient have difficulty seeing, even when wearing glasses/contacts?: Yes Does the patient have difficulty concentrating, remembering, or making decisions?: Yes Patient able to express need for assistance with ADLs?: No Does the patient have difficulty dressing or bathing?: Yes Independently performs ADLs?: No Communication: Needs assistance Is this a change from baseline?: Change from baseline, expected to last >3 days Dressing (OT):  Dependent Is this a change from baseline?: Change from baseline, expected to last >3 days Grooming: Dependent Is this a change from baseline?: Change from baseline, expected to last >3 days Feeding: Dependent Is this a change from baseline?: Change from baseline, expected to last >3 days Bathing: Dependent Is this a change from baseline?: Change from baseline, expected to last >3 days Toileting: Dependent Is this a change from baseline?: Change from baseline, expected to last >3days In/Out Bed: Dependent Is this a change from baseline?: Change from baseline, expected to last >3 days Walks in Home: Independent with device (comment) Does the patient have difficulty walking or climbing stairs?: Yes Weakness of Legs: Both Weakness of Arms/Hands: Both  Permission Sought/Granted                  Emotional Assessment Appearance:: Appears stated age            Admission diagnosis:  Intracranial hemorrhage ([place]) [I62.9] Patient Active Problem List   Diagnosis Date Noted  . Intracerebral bleed ([place]) 01/18/2019  . Constipation 08/31/2015  . Atrial tachycardia ([place]) 01/13/2015  . Anticoagulated on Coumadin 11/20/2012  . Chest pain 02/10/2012  . Hyperlipidemia 05/28/2010  . HYPERTENSION, BENIGN 10/31/2009  . Atrial fibrillation ([place]) 10/31/2009  . SVT/ PSVT/ PAT 09/04/2009   PCP:  Jani Gravel, MD Pharmacy:   [facility], [place]  AT Portal to Registered [place] [address] Phone: [phone] Fax: [phone]  [facility], [place] [address] [place] [address] Phone: [phone] Fax: [phone]     Social Determinants of Health (SDOH) Interventions    Readmission Risk Interventions No flowsheet data found.

## 2019-01-19 NOTE — Plan of Care (Signed)
  Problem: Education: Goal: Knowledge of disease or condition will improve Outcome: Progressing Goal: Knowledge of secondary prevention will improve Outcome: Progressing Goal: Knowledge of patient specific risk factors addressed and post discharge goals established will improve Outcome: Progressing Goal: Individualized Educational Video(s) Outcome: Progressing   Problem: Coping: Goal: Will verbalize positive feelings about self Outcome: Progressing Goal: Will identify appropriate support needs Outcome: Progressing   Problem: Health Behavior/Discharge Planning: Goal: Ability to manage health-related needs will improve Outcome: Progressing   Ival Bible, BSN, RN

## 2019-01-19 NOTE — Consult Note (Signed)
Consultation Note Date: 01/19/2019   Patient Name: [name]  DOB: 12/01/1924  MRN: [id number]  Age / Sex: 85 y.o., female  PCP: Jani Gravel, MD Referring Physician: Modena Jansky, MD  Reason for Consultation: Establishing goals of care  HPI/Patient Profile: 85 y.o. female  with past medical history of atrial fibrillation on Xarelto, HTN, HLD, hiatal hernia, GERD, arthritis, hypothyroidism admitted on 01/18/2019 after found slumped over in chair by family. Hospital admission for intracerebral hemorrhage with intraventricular extension and mass effect, 20mm right to left midline shift with associated intraventricular hemorrhage and evidence of early hydrocephalus. Nephrology consulted. Patient unresponsive with poor prognosis. New onset fevers, possibly neurogenic but could have aspiration pneumonitis/pneumonia. Palliative medicine consultation for goals of care.   Clinical Assessment and Goals of Care:  I have reviewed medical records, discussed with care team and assessed the patient. Shaden is lethargic. Family at bedside reports she will follow a few simple commands including squeezing their hands and accepting sips of water. Assisted with patient repositioning. She appears comfortable without signs of symptoms of distress.  Spoke with daughter [name]) and two grandchildren at bedside to discuss diagnosis, prognosis, GOC, EOL wishes, disposition and options.   Introduced Palliative Medicine as specialized medical care for people living with serious illness. It focuses on providing relief from the symptoms and stress of a serious illness.  We discussed a brief life review of the patient. Prior to hospitalization, living home alone and fairly independent. Family lives nearby and checks in on her often. Her husband died 5 years ago.  Discussed events leading up to admission and course of hospitalization  including diagnoses, interventions and poor prognosis. Daughter, [name] has spoke with [name] and [name] and has a good understanding of diagnoses and unfortunate poor prognosis.  I attempted to elicit values and goals of care important to the patient and family. Understanding poor prognosis, family shares [name]'s wishes against heroic interventions including resuscitation and feeding tube. They share her independence has been very important to her and would never want to live in a nursing home. They speak of focus on comfort.   The difference between aggressive medical intervention and comfort care was considered in light of the patient's goals of care.   Hospice philosophy and options discussed. Explained focus on comfort and discontinuation of interventions not aimed at comfort. Explained symptom management medications to ensure comfort, dignity, and peace at EOL and relief from any suffering. Discussed comfort feed and performing good oral care. Discussed aspiration risk, knowing she would not want a feeding tube. Family wishes for transfer to hospice facility [facility]) and [name] shares that her father died at [facility] about 5 years ago.  Questions and concerns were addressed.  Hard Choices booklet and Gone From my Sight booklets given. Emotional/spiritual support provided.     SUMMARY OF RECOMMENDATIONS    GOC discussed with family at bedside. Family confirms patient wishes against heroic measures at EOL. Understanding diagnoses and poor prognosis, family wishes to focus on comfort  measures and transition to hospice facility.   TOC RN/SW following for hospice facility placement. Family requesting [employer].  Symptom management--see below.  Comfort feeds per patient/family request. Family understands risk for aspiration.  Continue good oral care.   Spiritual care consult.  Pending hospice facility placement. Daily evaluation for patient stability for transfer. PMT  will follow inpatient.  Code Status/Advance Care Planning:  DNR  Symptom Management:   Morphine 1-2mg  IV q2h prn pain/dyspnea/air hunger/tachypnea  Ativan 0.5mg  IV q4h prn anxiety/seizures  Robinul 0.2mg  IV q4h prn secretions  Palliative Prophylaxis:   Aspiration, Delirium Protocol, Frequent Pain Assessment, Oral Care and Turn Reposition  Additional Recommendations (Limitations, Scope, Preferences):  Full Comfort Care  Psycho-social/Spiritual:   Desire for further Chaplaincy support: yes  Additional Recommendations: Caregiving  Support/Resources, Compassionate Wean Education and Education on Hospice  Prognosis:   < 2 weeks if not days  Discharge Planning: Hospice facility versus hospital death if further decline inpatient      Primary Diagnoses: Present on Admission: . Intracerebral bleed ([place])   I have reviewed the medical record, interviewed the patient and family, and examined the patient. The following aspects are pertinent.  Past Medical History:  Diagnosis Date  . Anxiety   . Arthritis    "back; hands" (01/13/2015)  . First degree AV block   . Gastroesophageal reflux disease   . History of hiatal hernia   . Hx of echocardiogram    echo 07/2009:  mild focal basal septal hypertrophy, EF 55-60%, mild MR, mod LAE, mild RAE, PASP 45  . Hyperlipidemia   . Hypertension   . Hypothyroidism   . Persistent atrial fibrillation       . Seasonal allergies   . Squamous carcinoma 2016   "right leg; right jawline"  . Squamous cell skin cancer   . SVT (supraventricular tachycardia) (H[facility])    possible atrial tachycardia, possible junctional tacycardia   Social History   Socioeconomic History  . Marital status: Widowed    Spouse name: Not on file  . Number of children: Not on file  . Years of education: Not on file  . Highest education level: Not on file  Occupational History  . Not on file  Social Needs  . Financial resource strain: Not on file  . Food  insecurity    Worry: Not on file    Inability: Not on file  . Transportation needs    Medical: Not on file    Non-medical: Not on file  Tobacco Use  . Smoking status: Never Smoker  . Smokeless tobacco: Never Used  Substance and Sexual Activity  . Alcohol use: No  . Drug use: No  . Sexual activity: Never  Lifestyle  . Physical activity    Days per week: Not on file    Minutes per session: Not on file  . Stress: Not on file  Relationships  . Social Herbalist on phone: Not on file    Gets together: Not on file    Attends religious service: Not on file    Active member of club or organization: Not on file    Attends meetings of clubs or organizations: Not on file    Relationship status: Not on file  Other Topics Concern  . Not on file  Social History Narrative   She has two children, two grandchildren and three great grandchildren   Family History  Problem Relation Age of Onset  . Heart disease Father  Scheduled Meds: Continuous Infusions: PRN Meds:.acetaminophen **OR** acetaminophen (TYLENOL) oral liquid 160 mg/5 mL **OR** acetaminophen, glycopyrrolate, LORazepam, morphine injection, ondansetron Medications Prior to Admission:  Prior to Admission medications   Medication Sig Start Date End Date Taking? Authorizing Provider  cholecalciferol (VITAMIN D3) 25 MCG (1000 UT) tablet Take 2,000 Units by mouth daily.   Yes [provider]  diltiazem (CARDIZEM CD) 180 MG 24 hr capsule Take 180 mg by mouth daily.  08/04/15  Yes Deboraha Sprang, MD  diltiazem (CARDIZEM) 30 MG tablet Take 1/2 - 1 tablet every 4 hours AS NEEDED for heart rate >100 as long as blood pressure >100 Patient taking differently: Take 30 mg by mouth See admin instructions. Take 15mg  before meals and at bedtime as needed for heart rate >100 as long as blood pressure >100 03/04/18  Yes Sherran Needs, NP  INCRUSE ELLIPTA 62.5 MCG/INH AEPB Inhale 1 puff into the lungs daily.  01/13/17  Yes  [provider]  labetalol (NORMODYNE) 100 MG tablet Take 100-150 mg by mouth See admin instructions. Take 150mg  in the morning and 100mg  in the evening.   Yes [provider]  levothyroxine (SYNTHROID, LEVOTHROID) 50 MCG tablet Take 50 mcg by mouth daily.     Yes [provider]  Multiple Vitamins-Minerals (VITEYES AREDS FORMULA/LUTEIN) CAPS Take 1 capsule by mouth 2 (two) times daily.    Yes [provider]  omeprazole (PRILOSEC) 40 MG capsule Take by mouth daily. 01/08/17  Yes [provider]  sertraline (ZOLOFT) 50 MG tablet Take 50 mg by mouth daily.   Yes [provider]  XARELTO 15 MG TABS tablet TAKE 1 TABLET (15 MG TOTAL) BY MOUTH DAILY WITH SUPPER. Patient taking differently: Take 15 mg by mouth daily.  10/20/18  Yes Sherran Needs, NP  meclizine (ANTIVERT) 25 MG tablet Take 25 mg by mouth daily as needed for dizziness.    [provider]  pantoprazole (PROTONIX) 20 MG tablet Take 1 tablet (20 mg total) by mouth daily. 11/27/10 10/21/11  Burnell Blanks, MD  pravastatin (PRAVACHOL) 80 MG tablet Take 40 mg by mouth Daily.  04/16/11 10/21/11  [provider]  spironolactone (ALDACTONE) 25 MG tablet Take 1/2 tablet by mouth once daily 12/11/10 10/21/11  Deboraha Sprang, MD   Allergies  Allergen Reactions  . Amiodarone Nausea And Vomiting    Only at higher dose.  Able to tolerate lower dose  . Amlodipine Other (See Comments)    unknown  . Dabigatran Other (See Comments)    Unknown, per pt   . Eliquis [Apixaban] Other (See Comments)    Made pt feel strange  . Sulfonamide Derivatives     REACTION: hives   Review of Systems  Unable to perform ROS: Acuity of condition   Physical Exam Vitals signs and nursing note reviewed.  Constitutional:      Appearance: She is ill-appearing.  Cardiovascular:     Rate and Rhythm: Rhythm regularly irregular.  Pulmonary:     Effort: No tachypnea, accessory muscle usage or  respiratory distress.     Breath sounds: Decreased breath sounds present.  Abdominal:     Tenderness: There is no abdominal tenderness.  Skin:    General: Skin is warm and dry.     Findings: Ecchymosis present.  Neurological:     Mental Status: She is lethargic.  Psychiatric:        Attention and Perception: She is inattentive.  Speech: She is noncommunicative.        Cognition and Memory: Cognition is impaired.    Vital Signs: BP (!) 115/94 (BP Location: Left Arm)   Pulse 93   Temp (!) 102.2 F (39 C) (Rectal)   Resp 17   Ht 5\' 2"  (1.575 m)   Wt 50.2 kg   SpO2 98%   BMI 20.24 kg/m        SpO2: SpO2: 98 % O2 Device:SpO2: 98 % O2 Flow Rate: .O2 Flow Rate (L/min): 1 L/min  IO: Intake/output summary:   Intake/Output Summary (Last 24 hours) at 01/19/2019 1406 Last data filed at 01/19/2019 0810 Gross per 24 hour  Intake 300 ml  Output -  Net 300 ml    LBM: Last BM Date: (pt unable to report) Baseline Weight: Weight: 50.2 kg Most recent weight: Weight: 50.2 kg     Palliative Assessment/Data: PPS 20%   Flowsheet Rows     Most Recent Value  Intake Tab  Referral Department  Hospitalist  Unit at Time of Referral  Med/Surg Unit  Palliative Care Primary Diagnosis  Neurology  Date Notified  01/19/19  Palliative Care Type  New Palliative care  Reason for referral  Clarify Goals of Care  Date first seen by Palliative Care  01/19/19  # of days Palliative referral response time  0 Day(s)  Clinical Assessment  Palliative Performance Scale Score  20%  Psychosocial & Spiritual Assessment  Palliative Care Outcomes  Patient/Family meeting held?  Yes  Who was at the meeting?  daughter, granddaughter, grandson  Palliative Care Outcomes  Counseled regarding hospice, Provided end of life care assistance, Provided psychosocial or spiritual support, Improved pain interventions, Improved non-pain symptom therapy, Clarified goals of care, ACP counseling assistance,  Transitioned to hospice      Time In: 1300 Time Out: 1340 Time Total: 40 Greater than 50%  of this time was spent counseling and coordinating care related to the above assessment and plan.  Signed by:  Ihor Dow, FNP-C Palliative Medicine Team  Phone: [phone] Fax: [phone]   Please contact Palliative Medicine Team phone at [phone] for questions and concerns.  For individual provider: See Shea Evans

## 2019-01-19 NOTE — Progress Notes (Signed)
Report received from [place] at [phone]. Pt on unit at 2017, AO to person, pt's daughter at bedside. Pt noted to be calm and cooperative as best. Admin documentation completed with help of pt's daughter. Noting by mouth clearly explained to pt's daughter, verbalizing an clear understanding.

## 2019-01-19 NOTE — Progress Notes (Signed)
OT Cancellation Note  Patient Details Name: Viktorya Arguijo Suttles MRN: [id number] DOB: [birth year]   Cancelled Treatment:    Reason Eval/Treat Not Completed: OT screened, no needs identified, will sign off(Comfort care only.) Noted that pt is now comfort care and a formal occupational therapy evaluation is no longer warranted. If needs change, please reconsult.   Ebony Hail Harold Hedge) Marsa Aris OTR/L Acute Rehabilitation Services Pager: [phone] Office: [place] 01/19/2019, 1:15 PM

## 2019-01-19 NOTE — Progress Notes (Signed)
STROKE TEAM PROGRESS NOTE   INTERVAL HISTORY I have reviewed history of presenting illness via discussion with patient's daughter and grandchildren at the bedside as well as review of electronic medical records and personally reviewed imaging films in PACS.  She has presented with right thalamic hemorrhage with intraventricular extension and neurological exam and prognosis are poor and she is unlikely to survive without major deficits and prolonged stay in nursing home.  Family are clear that the patient would not have wished to live her life of disability and are comfortable with decision on comfort care and moving to hospice nursing home like [facility] when bed is available  Vitals:   01/19/19 0133 01/19/19 0425 01/19/19 0738 01/19/19 0758  BP: 107/84 110/85 (!) 115/94   Pulse: (!) 109 (!) 108 93   Resp: 16 16 17    Temp: (!) 97.5 F (36.4 C) 97.9 F (36.6 C) 98.4 F (36.9 C) (!) 102.2 F (39 C)  TempSrc: Axillary Axillary Axillary Rectal  SpO2: 99% 99% 98%   Weight:      Height:        CBC:  Recent Labs  Lab 01/18/19 1646 01/18/19 1652  WBC 18.9*  --   NEUTROABS 16.2*  --   HGB 16.4* 18.4*  HCT 51.7* 54.0*  MCV 81.4  --   PLT 272  --     Basic Metabolic Panel:  Recent Labs  Lab 01/18/19 1646 01/18/19 1652 01/18/19 2225  NA 137 135  --   K 5.8* 5.8* 5.7*  CL 101 103  --   CO2 22  --   --   GLUCOSE 149* 146*  --   BUN 38* 51*  --   CREATININE 1.69* 1.40*  --   CALCIUM 9.8  --   --     Alcohol Level     Component Value Date/Time   ETH <10 01/18/2019 1646    IMAGING Ct Cervical Spine Wo Contrast  Result Date: 01/18/2019 CLINICAL DATA:  Code stroke. Initial evaluation for acute core stroke. Left-sided deficits. EXAM: CT HEAD WITHOUT CONTRAST CT CERVICAL SPINE WITHOUT CONTRAST TECHNIQUE: Multidetector CT imaging of the head and cervical spine was performed following the standard protocol without intravenous contrast. Multiplanar CT image reconstructions of  the cervical spine were also generated. COMPARISON:  Prior CT from 08/25/2015. FINDINGS: CT HEAD FINDINGS Brain: Acute intraparenchymal hemorrhage centered at the right thalamus measures 3.8 x 3.8 x 5.0 cm (estimated volume 36 cc). Localized vasogenic edema with mild regional mass effect. Associated right-to-left midline shift measures up to 6 mm. Associated intraventricular extension with blood seen within the lateral and third ventricles. Dilatation of the temporal horns of both lateral ventricles compatible with associated early hydrocephalus. No ventricular trapping. Basilar cisterns remain patent. No other acute intracranial hemorrhage. No other acute large vessel territory infarct. Underlying atrophy with advanced chronic microvascular ischemic disease. Scattered superimposed remote lacunar infarcts within the bilateral basal ganglia and left thalamus. No appreciable mass lesion. No extra-axial fluid collection. Vascular: No asymmetric hyperdense vessel. Scattered vascular calcifications noted within the carotid siphons. Skull: Diffuse soft tissue edema seen throughout the scalp and left greater than right face. Calvarium intact. Sinuses/Orbits: Globes and orbital soft tissues demonstrate no acute finding. Scattered mucosal thickening within the ethmoidal air cells. Small air-fluid levels noted within the right maxillary and left sphenoid sinuses. No mastoid effusion. Other: None. CT CERVICAL SPINE FINDINGS Alignment: Straightening of the normal cervical lordosis. Grade 1 anterolisthesis of C2 on C3 and C7 on  T1, with grade 1 retrolisthesis of C3 on C4 and C5 on C6. Findings likely chronic and facet mediated. Skull base and vertebrae: Skull base intact. Normal C1-2 articulations are preserved in the dens is intact. Vertebral body heights maintained. No acute fracture. Focal lucency extending through the left lateral mass of C2 on coronal image 32 favored to be chronic in nature. Soft tissues and spinal canal:  Diffuse soft tissue edema extends from the scalp into the left lateral and posterior neck. Diffuse prevertebral edema, suspected to be related to the scalp edema. Prominent vascular calcifications about the carotid bifurcations. 8 mm hypodense right thyroid nodule noted, of doubtful significance. Spinal canal within normal limits. Disc levels: Moderate multilevel cervical spondylolysis at C3-4 through C7-T1. C4 and C5 vertebral bodies are largely ankylosed. Upper chest: Visualized upper chest demonstrates no acute finding. 4 mm nodule present at the right upper lobe (series 9, image 92). Few additional 4 mm nodules noted within the visualized peripheral left upper lobe, indeterminate. Partially visualized lungs are otherwise clear. Other: None. IMPRESSION: CT BRAIN: 1. Acute intraparenchymal hemorrhage centered at the right thalamus, estimated volume 36 cc, with associated regional mass effect and 6 mm right-to-left midline shift. 2. Associated intraventricular hemorrhage with evidence for early hydrocephalus. 3. Diffuse scalp edema extending into the left greater than right face. 4. Underlying atrophy with advanced chronic microvascular ischemic disease. CT CERVICAL SPINE: 1. No CT evidence for acute fracture or other definite traumatic injury within the cervical spine. 2. Diffuse soft tissue edema extending from the scalp into the left neck, with associated diffuse prevertebral edema. While the prevertebral edema is favored to be related to the diffuse soft tissue edema seen elsewhere within the head and neck, possible superimposed ligamentous injury could also have this appearance. If there is high clinical suspicion for possible occult ligamentous injury, then further evaluation with dedicated MRI would be warranted. 3. Moderate cervical spondylolysis at C3-4 through C7-T1. 4. Multiple scattered subcentimeter pulmonary nodules measuring up to 4 mm within the visualized upper lungs, indeterminate. No follow-up  needed if patient is low-risk (and has no known or suspected primary neoplasm). Non-contrast chest CT can be considered in 12 months if patient is high-risk. This recommendation follows the consensus statement: Guidelines for Management of Incidental Pulmonary Nodules Detected on CT Images: From the Fleischner Society 2017; Radiology 2017; 284:228-243. These results were communicated to Dr. Lorraine Lax at [place] 6/22/2020by text page via the [facility] messaging system. Electronically Signed   By: Jeannine Boga M.D.   On: 01/18/2019 17:37   Ct Head Code Stroke Wo Contrast  Result Date: 01/18/2019 CLINICAL DATA:  Code stroke. Initial evaluation for acute core stroke. Left-sided deficits. EXAM: CT HEAD WITHOUT CONTRAST CT CERVICAL SPINE WITHOUT CONTRAST TECHNIQUE: Multidetector CT imaging of the head and cervical spine was performed following the standard protocol without intravenous contrast. Multiplanar CT image reconstructions of the cervical spine were also generated. COMPARISON:  Prior CT from 08/25/2015. FINDINGS: CT HEAD FINDINGS Brain: Acute intraparenchymal hemorrhage centered at the right thalamus measures 3.8 x 3.8 x 5.0 cm (estimated volume 36 cc). Localized vasogenic edema with mild regional mass effect. Associated right-to-left midline shift measures up to 6 mm. Associated intraventricular extension with blood seen within the lateral and third ventricles. Dilatation of the temporal horns of both lateral ventricles compatible with associated early hydrocephalus. No ventricular trapping. Basilar cisterns remain patent. No other acute intracranial hemorrhage. No other acute large vessel territory infarct. Underlying atrophy with advanced chronic microvascular ischemic disease.  Scattered superimposed remote lacunar infarcts within the bilateral basal ganglia and left thalamus. No appreciable mass lesion. No extra-axial fluid collection. Vascular: No asymmetric hyperdense vessel. Scattered vascular  calcifications noted within the carotid siphons. Skull: Diffuse soft tissue edema seen throughout the scalp and left greater than right face. Calvarium intact. Sinuses/Orbits: Globes and orbital soft tissues demonstrate no acute finding. Scattered mucosal thickening within the ethmoidal air cells. Small air-fluid levels noted within the right maxillary and left sphenoid sinuses. No mastoid effusion. Other: None. CT CERVICAL SPINE FINDINGS Alignment: Straightening of the normal cervical lordosis. Grade 1 anterolisthesis of C2 on C3 and C7 on T1, with grade 1 retrolisthesis of C3 on C4 and C5 on C6. Findings likely chronic and facet mediated. Skull base and vertebrae: Skull base intact. Normal C1-2 articulations are preserved in the dens is intact. Vertebral body heights maintained. No acute fracture. Focal lucency extending through the left lateral mass of C2 on coronal image 32 favored to be chronic in nature. Soft tissues and spinal canal: Diffuse soft tissue edema extends from the scalp into the left lateral and posterior neck. Diffuse prevertebral edema, suspected to be related to the scalp edema. Prominent vascular calcifications about the carotid bifurcations. 8 mm hypodense right thyroid nodule noted, of doubtful significance. Spinal canal within normal limits. Disc levels: Moderate multilevel cervical spondylolysis at C3-4 through C7-T1. C4 and C5 vertebral bodies are largely ankylosed. Upper chest: Visualized upper chest demonstrates no acute finding. 4 mm nodule present at the right upper lobe (series 9, image 92). Few additional 4 mm nodules noted within the visualized peripheral left upper lobe, indeterminate. Partially visualized lungs are otherwise clear. Other: None. IMPRESSION: CT BRAIN: 1. Acute intraparenchymal hemorrhage centered at the right thalamus, estimated volume 36 cc, with associated regional mass effect and 6 mm right-to-left midline shift. 2. Associated intraventricular hemorrhage with  evidence for early hydrocephalus. 3. Diffuse scalp edema extending into the left greater than right face. 4. Underlying atrophy with advanced chronic microvascular ischemic disease. CT CERVICAL SPINE: 1. No CT evidence for acute fracture or other definite traumatic injury within the cervical spine. 2. Diffuse soft tissue edema extending from the scalp into the left neck, with associated diffuse prevertebral edema. While the prevertebral edema is favored to be related to the diffuse soft tissue edema seen elsewhere within the head and neck, possible superimposed ligamentous injury could also have this appearance. If there is high clinical suspicion for possible occult ligamentous injury, then further evaluation with dedicated MRI would be warranted. 3. Moderate cervical spondylolysis at C3-4 through C7-T1. 4. Multiple scattered subcentimeter pulmonary nodules measuring up to 4 mm within the visualized upper lungs, indeterminate. No follow-up needed if patient is low-risk (and has no known or suspected primary neoplasm). Non-contrast chest CT can be considered in 12 months if patient is high-risk. This recommendation follows the consensus statement: Guidelines for Management of Incidental Pulmonary Nodules Detected on CT Images: From the Fleischner Society 2017; Radiology 2017; 284:228-243. These results were communicated to Dr. Lorraine Lax at [place] 6/22/2020by text page via the [facility] messaging system. Electronically Signed   By: Jeannine Boga M.D.   On: 01/18/2019 17:37    PHYSICAL EXAM Frail elderly Caucasian lady lying comfortably in bed.  Not in distress. . Afebrile. Head is nontraumatic. Neck is supple without bruit.    Cardiac exam no murmur or gallop. Lungs are clear to auscultation. Distal pulses are well felt. Neurological Exam :  Patient is lying in bed with eyes closed.  She has right gaze preference.  She does open eyes partially to sternal rub.  She speaks a few words but speech is severely  dysarthric and difficult to understand.  Doll's eye movements are sluggish.  She has dense left hemiplegia with flaccidity and hypotonia.  She has purposeful movements on the right side against gravity. ASSESSMENT/PLAN Ms. [name] is a 85 y.o. female with history of A. fib on Xarelto, HTN, HLD, hypothyroidism presenting found slumped over in chair with left-sided weakness, right gaze deviation, lethargic and would follow some commands.    Hemorrhage:  Large R thalamic ICH on Xarelto for AF  CT head R thalamic ICH 36 cc with regional mass-effect and 6 mm right to left shift.  Associated IVH with early hydrocephalus.  Diffuse scalp edema left greater than right face.  Underlying atrophy and small vessel disease.  CT CS no fracture.  Diffuse soft tissue injury extending from scalp into left neck with diffuse prevertebral edema.  Possible ligamentous injury.  Moderate cervical spondylosis C3-4 through C7-T1.  Multiple scattered subcentimeter pulmonary nodules, indeterminate.  SCDs for VTE prophylaxis  Xarelto (rivaroxaban) daily prior to admission, now on No antithrombotic given hemorrhage  Disposition:  pending   Hemorrhage is neurologically devastating.  Family has opted for comfort care.  Atrial Fibrillation with RVR  Home anticoagulation:  Xarelto (rivaroxaban) daily   Not an anticoagulant candidate due to hemorrhage   Hypertension  Blood pressure not elevated on arrival   Stable  SBP goal < 140  Hyperlipidemia  Home meds: No statin listed  No statin now given hemorrhage  Dysphagia . Secondary to stroke . NPO . Speech on board   Other Stroke Risk Factors  Advanced age  SVT  Other Active Problems  Fever. TMax 102.2  AKI stage III  Hospital day # 1  I have personally obtained history,examined this patient, reviewed notes, independently viewed imaging studies, participated in medical decision making and plan of care.ROS completed by me personally and  pertinent positives fully documented  I have made any additions or clarifications directly to the above note. . She has had a large right subcortical hemorrhage with intraventricular extension and was on Xarelto for anticoagulation for A. fib.  Her prognosis is poor and chances of making significant recovery and living independently and negligible.  The patient's daughter and family are clear that she would not want to live life a major disability and agreed to DNR and comfort care and palliative care measures only.  Discussed with daughter and Dr. Algis Liming 8.  Transfer  to [facility] when bed available.  Discussed with Education officer, museum.  Greater than 50% time during this 25-minute visit was spent on counseling and coordination of care about her intracerebral hemorrhage and discussion with care team on plan of care.  Stroke team will sign off.  Kindly call for questions.  Antony Contras, MD Medical Director [facility] Pager: [phone] 01/19/2019 2:33 PM   To contact Stroke Continuity provider, please refer to [email or web address] After hours, contact General Neurology

## 2019-01-19 NOTE — Progress Notes (Signed)
PT Cancellation Note  Patient Details Name: [name] MRN: [id number] DOB: [birth year]   Cancelled Treatment:    Reason Eval/Treat Not Completed: PT screened, no needs identified, will sign off. Noted that pt is now comfort care and a formal physical therapy evaluation is no longer warranted. If needs change, please reconsult.    Thelma Comp 01/19/2019, 11:23 AM   Rolinda Roan, PT, DPT Acute Rehabilitation Services Pager: [phone] Office: [phone]

## 2019-01-19 NOTE — Progress Notes (Signed)
PROGRESS NOTE   [name]  HER:[id number]    DOB: April 07, 1925    DOA: 01/18/2019  PCP: Jani Gravel, MD   I have briefly reviewed patients previous medical records in [facility].  Brief Narrative:  85 year old widowed female, lives alone independently, PMH of persistent atrial fibrillation on Xarelto, HTN, HLD, hypothyroid, GERD, presented to [facility] ED via EMS on 6/22 after daughter found her slumped over in the chair at the kitchen table on day of admission.  Admitted for large intracerebral hemorrhage with intraventricular extension and mass-effect.  Extremely poor prognosis.  Full comfort care.  PMT consulted.   Assessment & Plan:   Active Problems:   Intracerebral bleed (HCC)   Intracerebral hemorrhage  In the context of Xarelto.  CT head: Acute intraparenchymal hemorrhage in the right thalamus estimated volume 36 cc with associated regional mass-effect and 6 mm right to left midline shift.  Associated intraventricular hemorrhage with evidence of early hydrocephalus.  Significant scalp edema noted so not sure if patient sustained a fall and had head injury.  Associated left hemiplegia and unresponsiveness.  Neurology input appreciated: Poor prognosis and recommend PMT consult- awaiting input.  I discussed in detail with patient's daughter at bedside, advised her regarding her extreme poor prognosis and suspect that she will likely not make it out alive through this and even if she does, she will be left with severe disabilities both physically and cognitively.  Daughter at this time confirms DNR and full comfort care.  She agreed to discontinuing all measures not pertaining to comfort-discontinued IV fluids, labs, medications nonessential to comfort.  This was witnessed by patient's RN at bedside. She requests PMT input which is pending at this time.  Disposition: If rapidly declines, hospital death versus home or residential hospice.  Daughter is aware of hospice from her  father's demise approximately 5 years ago and inquires regarding [employer].  CSW consulted for residential hospice.  Fever  Possibly neurogenic but could have aspiration pneumonitis/pneumonia due to mental status changes, dysphagia from [place] and vomiting that she had PTA.  Supportive care.  No antibiotics due to comfort care.  Acute kidney injury complicating stage III chronic kidney disease/mild hyperkalemia  Noted.  A. fib with RVR  Now full comfort care.  HTN  Controlled.  HLD  Leukocytosis   DVT prophylaxis: None due to comfort care path. Code Status: DNR, confirmed with daughter at bedside. Family Communication: Discussed in detail with patient's daughter at bedside, updated care and answered all questions. Disposition: To be determined based on rapidity of decline.  Could be hospital death versus home/residential hospice.   Consultants:  Neurology PMT  Procedures:  None  Antimicrobials:  None   Subjective: Patient unresponsive and unable to provide any history.  As per RN and daughter at bedside, does not appear to be in any pain.  Had fever of 102.2 F and got rectal Tylenol for same.  ROS: Unable due to mental status changes.  Objective:  Vitals:   01/19/19 0133 01/19/19 0425 01/19/19 0738 01/19/19 0758  BP: 107/84 110/85 (!) 115/94   Pulse: (!) 109 (!) 108 93   Resp: 16 16 17    Temp: (!) 97.5 F (36.4 C) 97.9 F (36.6 C) 98.4 F (36.9 C) (!) 102.2 F (39 C)  TempSrc: Axillary Axillary Axillary Rectal  SpO2: 99% 99% 98%   Weight:      Height:        Examination:  General exam: Elderly female, small built and  thinly nourished, lying comfortably in left lateral position and does not appear in any distress. Respiratory system: Poor inspiratory effort.  Clear to auscultation anteriorly.  Diminished breath sounds in the bases.  No wheezing or rhonchi.  No increased work of breathing. Cardiovascular system: S1 & S2 heard, irregularly irregular  and tachycardic. No JVD, murmurs, rubs, gallops or clicks. No pedal edema.  Telemetry personally reviewed: A. fib with RVR in the 110s-140s. Gastrointestinal system: Abdomen is nondistended, soft and nontender. No organomegaly or masses felt. Normal bowel sounds heard. Central nervous system: No eye-opening or verbal response.  Spontaneous purposeful movements of right upper extremity.  Pupils 3 mm with either sluggish reaction to light or no reaction to light.  Significant periorbital edema especially on the right side and difficult to open eyes.  Extremities: Moves right upper extremity spontaneously.  Left flaccid hemiplegia. Skin: No rashes, lesions or ulcers Psychiatry: Judgement and insight impaired. Mood & affect cannot be assessed.     Data Reviewed: I have personally reviewed following labs and imaging studies  CBC: Recent Labs  Lab 01/18/19 1646 01/18/19 1652  WBC 18.9*  --   NEUTROABS 16.2*  --   HGB 16.4* 18.4*  HCT 51.7* 54.0*  MCV 81.4  --   PLT 272  --    Basic Metabolic Panel: Recent Labs  Lab 01/18/19 1646 01/18/19 1652 01/18/19 2225  NA 137 135  --   K 5.8* 5.8* 5.7*  CL 101 103  --   CO2 22  --   --   GLUCOSE 149* 146*  --   BUN 38* 51*  --   CREATININE 1.69* 1.40*  --   CALCIUM 9.8  --   --    Liver Function Tests: Recent Labs  Lab 01/18/19 1646  AST 144*  ALT 42  ALKPHOS 117  BILITOT 1.5*  PROT 7.0  ALBUMIN 4.1   Coagulation Profile: Recent Labs  Lab 01/18/19 1646  INR 1.0   Cardiac Enzymes: No results for input(s): CKTOTAL, CKMB, CKMBINDEX, TROPONINI in the last 168 hours. HbA1C: No results for input(s): HGBA1C in the last 72 hours. CBG: Recent Labs  Lab 01/18/19 1644 01/18/19 2109  GLUCAP 151* 119*    Recent Results (from the past 240 hour(s))  SARS Coronavirus 2 (CEPHEID - Performed in [place] hospital lab), Hosp Order     Status: None   Collection Time: 01/18/19  5:26 PM   Specimen: Nasopharyngeal Swab  Result Value  Ref Range Status   SARS Coronavirus 2 NEGATIVE NEGATIVE Final    Comment: (NOTE) If result is NEGATIVE SARS-CoV-2 target nucleic acids are NOT DETECTED. The SARS-CoV-2 RNA is generally detectable in upper and lower  respiratory specimens during the acute phase of infection. The lowest  concentration of SARS-CoV-2 viral copies this assay can detect is 250  copies / mL. A negative result does not preclude SARS-CoV-2 infection  and should not be used as the sole basis for treatment or other  patient management decisions.  A negative result may occur with  improper specimen collection / handling, submission of specimen other  than nasopharyngeal swab, presence of viral mutation(s) within the  areas targeted by this assay, and inadequate number of viral copies  (<250 copies / mL). A negative result must be combined with clinical  observations, patient history, and epidemiological information. If result is POSITIVE SARS-CoV-2 target nucleic acids are DETECTED. The SARS-CoV-2 RNA is generally detectable in upper and lower  respiratory specimens dur ing the  acute phase of infection.  Positive  results are indicative of active infection with SARS-CoV-2.  Clinical  correlation with patient history and other diagnostic information is  necessary to determine patient infection status.  Positive results do  not rule out bacterial infection or co-infection with other viruses. If result is PRESUMPTIVE POSTIVE SARS-CoV-2 nucleic acids MAY BE PRESENT.   A presumptive positive result was obtained on the submitted specimen  and confirmed on repeat testing.  While 2019 novel coronavirus  (SARS-CoV-2) nucleic acids may be present in the submitted sample  additional confirmatory testing may be necessary for epidemiological  and / or clinical management purposes  to differentiate between  SARS-CoV-2 and other Sarbecovirus currently known to infect humans.  If clinically indicated additional testing with an  alternate test  methodology [phone]) is advised. The SARS-CoV-2 RNA is generally  detectable in upper and lower respiratory sp ecimens during the acute  phase of infection. The expected result is Negative. Fact Sheet for Patients:  StrictlyIdeas.no Fact Sheet for Healthcare Providers: BankingDealers.co.za This test is not yet approved or cleared by the [place] FDA and has been authorized for detection and/or diagnosis of SARS-CoV-2 by FDA under an Emergency Use Authorization (EUA).  This EUA will remain in effect (meaning this test can be used) for the duration of the COVID-19 declaration under Section 564(b)(1) of the Act, 21 U.S.C. section 360bbb-3(b)(1), unless the authorization is terminated or revoked sooner. Performed at [facility], [place] [address] [place], [place] [address]          Radiology Studies: Ct Cervical Spine Wo Contrast  Result Date: 01/18/2019 CLINICAL DATA:  Code stroke. Initial evaluation for acute core stroke. Left-sided deficits. EXAM: CT HEAD WITHOUT CONTRAST CT CERVICAL SPINE WITHOUT CONTRAST TECHNIQUE: Multidetector CT imaging of the head and cervical spine was performed following the standard protocol without intravenous contrast. Multiplanar CT image reconstructions of the cervical spine were also generated. COMPARISON:  Prior CT from 08/25/2015. FINDINGS: CT HEAD FINDINGS Brain: Acute intraparenchymal hemorrhage centered at the right thalamus measures 3.8 x 3.8 x 5.0 cm (estimated volume 36 cc). Localized vasogenic edema with mild regional mass effect. Associated right-to-left midline shift measures up to 6 mm. Associated intraventricular extension with blood seen within the lateral and third ventricles. Dilatation of the temporal horns of both lateral ventricles compatible with associated early hydrocephalus. No ventricular trapping. Basilar cisterns remain patent. No other acute intracranial  hemorrhage. No other acute large vessel territory infarct. Underlying atrophy with advanced chronic microvascular ischemic disease. Scattered superimposed remote lacunar infarcts within the bilateral basal ganglia and left thalamus. No appreciable mass lesion. No extra-axial fluid collection. Vascular: No asymmetric hyperdense vessel. Scattered vascular calcifications noted within the carotid siphons. Skull: Diffuse soft tissue edema seen throughout the scalp and left greater than right face. Calvarium intact. Sinuses/Orbits: Globes and orbital soft tissues demonstrate no acute finding. Scattered mucosal thickening within the ethmoidal air cells. Small air-fluid levels noted within the right maxillary and left sphenoid sinuses. No mastoid effusion. Other: None. CT CERVICAL SPINE FINDINGS Alignment: Straightening of the normal cervical lordosis. Grade 1 anterolisthesis of C2 on C3 and C7 on T1, with grade 1 retrolisthesis of C3 on C4 and C5 on C6. Findings likely chronic and facet mediated. Skull base and vertebrae: Skull base intact. Normal C1-2 articulations are preserved in the dens is intact. Vertebral body heights maintained. No acute fracture. Focal lucency extending through the left lateral mass of C2 on coronal image 32 favored to  be chronic in nature. Soft tissues and spinal canal: Diffuse soft tissue edema extends from the scalp into the left lateral and posterior neck. Diffuse prevertebral edema, suspected to be related to the scalp edema. Prominent vascular calcifications about the carotid bifurcations. 8 mm hypodense right thyroid nodule noted, of doubtful significance. Spinal canal within normal limits. Disc levels: Moderate multilevel cervical spondylolysis at C3-4 through C7-T1. C4 and C5 vertebral bodies are largely ankylosed. Upper chest: Visualized upper chest demonstrates no acute finding. 4 mm nodule present at the right upper lobe (series 9, image 92). Few additional 4 mm nodules noted within  the visualized peripheral left upper lobe, indeterminate. Partially visualized lungs are otherwise clear. Other: None. IMPRESSION: CT BRAIN: 1. Acute intraparenchymal hemorrhage centered at the right thalamus, estimated volume 36 cc, with associated regional mass effect and 6 mm right-to-left midline shift. 2. Associated intraventricular hemorrhage with evidence for early hydrocephalus. 3. Diffuse scalp edema extending into the left greater than right face. 4. Underlying atrophy with advanced chronic microvascular ischemic disease. CT CERVICAL SPINE: 1. No CT evidence for acute fracture or other definite traumatic injury within the cervical spine. 2. Diffuse soft tissue edema extending from the scalp into the left neck, with associated diffuse prevertebral edema. While the prevertebral edema is favored to be related to the diffuse soft tissue edema seen elsewhere within the head and neck, possible superimposed ligamentous injury could also have this appearance. If there is high clinical suspicion for possible occult ligamentous injury, then further evaluation with dedicated MRI would be warranted. 3. Moderate cervical spondylolysis at C3-4 through C7-T1. 4. Multiple scattered subcentimeter pulmonary nodules measuring up to 4 mm within the visualized upper lungs, indeterminate. No follow-up needed if patient is low-risk (and has no known or suspected primary neoplasm). Non-contrast chest CT can be considered in 12 months if patient is high-risk. This recommendation follows the consensus statement: Guidelines for Management of Incidental Pulmonary Nodules Detected on CT Images: From the Fleischner Society 2017; Radiology 2017; 284:228-243. These results were communicated to Dr. Lorraine Lax at [place] 6/22/2020by text page via the [facility] messaging system. Electronically Signed   By: Jeannine Boga M.D.   On: 01/18/2019 17:37   Ct Head Code Stroke Wo Contrast  Result Date: 01/18/2019 CLINICAL DATA:  Code stroke.  Initial evaluation for acute core stroke. Left-sided deficits. EXAM: CT HEAD WITHOUT CONTRAST CT CERVICAL SPINE WITHOUT CONTRAST TECHNIQUE: Multidetector CT imaging of the head and cervical spine was performed following the standard protocol without intravenous contrast. Multiplanar CT image reconstructions of the cervical spine were also generated. COMPARISON:  Prior CT from 08/25/2015. FINDINGS: CT HEAD FINDINGS Brain: Acute intraparenchymal hemorrhage centered at the right thalamus measures 3.8 x 3.8 x 5.0 cm (estimated volume 36 cc). Localized vasogenic edema with mild regional mass effect. Associated right-to-left midline shift measures up to 6 mm. Associated intraventricular extension with blood seen within the lateral and third ventricles. Dilatation of the temporal horns of both lateral ventricles compatible with associated early hydrocephalus. No ventricular trapping. Basilar cisterns remain patent. No other acute intracranial hemorrhage. No other acute large vessel territory infarct. Underlying atrophy with advanced chronic microvascular ischemic disease. Scattered superimposed remote lacunar infarcts within the bilateral basal ganglia and left thalamus. No appreciable mass lesion. No extra-axial fluid collection. Vascular: No asymmetric hyperdense vessel. Scattered vascular calcifications noted within the carotid siphons. Skull: Diffuse soft tissue edema seen throughout the scalp and left greater than right face. Calvarium intact. Sinuses/Orbits: Globes and orbital soft tissues demonstrate no  acute finding. Scattered mucosal thickening within the ethmoidal air cells. Small air-fluid levels noted within the right maxillary and left sphenoid sinuses. No mastoid effusion. Other: None. CT CERVICAL SPINE FINDINGS Alignment: Straightening of the normal cervical lordosis. Grade 1 anterolisthesis of C2 on C3 and C7 on T1, with grade 1 retrolisthesis of C3 on C4 and C5 on C6. Findings likely chronic and facet  mediated. Skull base and vertebrae: Skull base intact. Normal C1-2 articulations are preserved in the dens is intact. Vertebral body heights maintained. No acute fracture. Focal lucency extending through the left lateral mass of C2 on coronal image 32 favored to be chronic in nature. Soft tissues and spinal canal: Diffuse soft tissue edema extends from the scalp into the left lateral and posterior neck. Diffuse prevertebral edema, suspected to be related to the scalp edema. Prominent vascular calcifications about the carotid bifurcations. 8 mm hypodense right thyroid nodule noted, of doubtful significance. Spinal canal within normal limits. Disc levels: Moderate multilevel cervical spondylolysis at C3-4 through C7-T1. C4 and C5 vertebral bodies are largely ankylosed. Upper chest: Visualized upper chest demonstrates no acute finding. 4 mm nodule present at the right upper lobe (series 9, image 92). Few additional 4 mm nodules noted within the visualized peripheral left upper lobe, indeterminate. Partially visualized lungs are otherwise clear. Other: None. IMPRESSION: CT BRAIN: 1. Acute intraparenchymal hemorrhage centered at the right thalamus, estimated volume 36 cc, with associated regional mass effect and 6 mm right-to-left midline shift. 2. Associated intraventricular hemorrhage with evidence for early hydrocephalus. 3. Diffuse scalp edema extending into the left greater than right face. 4. Underlying atrophy with advanced chronic microvascular ischemic disease. CT CERVICAL SPINE: 1. No CT evidence for acute fracture or other definite traumatic injury within the cervical spine. 2. Diffuse soft tissue edema extending from the scalp into the left neck, with associated diffuse prevertebral edema. While the prevertebral edema is favored to be related to the diffuse soft tissue edema seen elsewhere within the head and neck, possible superimposed ligamentous injury could also have this appearance. If there is high  clinical suspicion for possible occult ligamentous injury, then further evaluation with dedicated MRI would be warranted. 3. Moderate cervical spondylolysis at C3-4 through C7-T1. 4. Multiple scattered subcentimeter pulmonary nodules measuring up to 4 mm within the visualized upper lungs, indeterminate. No follow-up needed if patient is low-risk (and has no known or suspected primary neoplasm). Non-contrast chest CT can be considered in 12 months if patient is high-risk. This recommendation follows the consensus statement: Guidelines for Management of Incidental Pulmonary Nodules Detected on CT Images: From the Fleischner Society 2017; Radiology 2017; 284:228-243. These results were communicated to Dr. Lorraine Lax at [place] 6/22/2020by text page via the [facility] messaging system. Electronically Signed   By: Jeannine Boga M.D.   On: 01/18/2019 17:37        Scheduled Meds: Continuous Infusions:   LOS: 1 day     Vernell Leep, MD, FACP, [facility]. [facility]  To contact the attending provider between 7A-7P or the covering provider during after hours 7P-7A, please log into the web site [email or web address] and access using universal [place] password for that web site. If you do not have the password, please call the hospital operator.  01/19/2019, 11:37 AM

## 2019-01-20 ENCOUNTER — Encounter (INDEPENDENT_AMBULATORY_CARE_PROVIDER_SITE_OTHER): Payer: Medicare Other | Admitting: Ophthalmology

## 2019-01-20 MED ORDER — ACETAMINOPHEN 650 MG RE SUPP: 650.0000 mg | RECTAL | Status: DC | PRN

## 2019-01-20 MED ORDER — METOPROLOL TARTRATE 5 MG/5ML IV SOLN: 5.0000 mg | Freq: Four times a day (QID) | INTRAVENOUS | Status: DC | PRN

## 2019-01-20 MED ORDER — ACETAMINOPHEN 325 MG PO TABS: 650.0000 mg | ORAL_TABLET | ORAL | Status: DC | PRN

## 2019-01-20 MED ORDER — ACETAMINOPHEN 160 MG/5ML PO SOLN: 650.0000 mg | ORAL | Status: DC | PRN

## 2019-01-20 MED ORDER — MORPHINE SULFATE (CONCENTRATE) 10 MG/0.5ML PO SOLN
5.0000 mg | ORAL | Status: DC | PRN
  Administered 2019-01-20 – 2019-01-21 (×3): 10 mg via SUBLINGUAL
  Filled 2019-01-20 (×4): qty 0.5

## 2019-01-20 MED ORDER — MORPHINE SULFATE (PF) 2 MG/ML IV SOLN: 2.0000 mg | INTRAVENOUS | Status: DC | PRN

## 2019-01-20 NOTE — Progress Notes (Signed)
Daily Progress Note   Patient Name: [name]       Date: 01/20/2019 DOB: 1925/06/07  Age: 85 y.o. MRN#: [id number] Attending Physician: Antonieta Pert, MD Primary Care Physician: Jani Gravel, MD Admit Date: 01/18/2019  Reason for Consultation/Follow-up: Establishing goals of care  Subjective:  Patient lethargic but appears comfortable with no distress. Respirations regular, shallow. No facial grimacing.   Granddaughter, Anderson Malta at bedside. Discussed plan of care for comfort, EOL expectations, comfort feeds, and comfort medications. Waiting on hospice facility bed.   Appreciate chaplain visit at bedside with family. Answered all questions and emotional/spiritual support provided.    Length of Stay: 2  Current Medications: Scheduled Meds:    Continuous Infusions:   PRN Meds: acetaminophen **OR** acetaminophen (TYLENOL) oral liquid 160 mg/5 mL **OR** acetaminophen, atropine, LORazepam, LORazepam, morphine injection, morphine CONCENTRATE, ondansetron  Physical Exam Vitals signs and nursing note reviewed.  Constitutional:      Appearance: She is ill-appearing.  Cardiovascular:     Rate and Rhythm: Rhythm regularly irregular.  Pulmonary:     Effort: No tachypnea, accessory muscle usage or respiratory distress.     Breath sounds: Normal breath sounds.  Skin:    General: Skin is warm and dry.     Findings: Ecchymosis present.  Neurological:     Mental Status: She is lethargic.            Vital Signs: BP (!) 144/100   Pulse 76   Temp 99.6 F (37.6 C) (Oral)   Resp 18   Ht 5\' 2"  (1.575 m)   Wt 50.2 kg   SpO2 94%   BMI 20.24 kg/m  SpO2: SpO2: 94 % O2 Device: O2 Device: Room Air O2 Flow Rate: O2 Flow Rate (L/min): 1 L/min  Intake/output summary: No intake or output  data in the 24 hours ending 01/20/19 1034 LBM: Last BM Date: (pt unable to report) Baseline Weight: Weight: 50.2 kg Most recent weight: Weight: 50.2 kg       Palliative Assessment/Data: PPS 20%    Flowsheet Rows     Most Recent Value  Intake Tab  Referral Department  Hospitalist  Unit at Time of Referral  Med/Surg Unit  Palliative Care Primary Diagnosis  Neurology  Date Notified  01/19/19  Palliative Care Type  New Palliative care  Reason  for referral  Clarify Goals of Care  Date first seen by Palliative Care  01/19/19  # of days Palliative referral response time  0 Day(s)  Clinical Assessment  Palliative Performance Scale Score  20%  Psychosocial & Spiritual Assessment  Palliative Care Outcomes  Patient/Family meeting held?  Yes  Who was at the meeting?  daughter, granddaughter, grandson  Palliative Care Outcomes  Counseled regarding hospice, Provided end of life care assistance, Provided psychosocial or spiritual support, Improved pain interventions, Improved non-pain symptom therapy, Clarified goals of care, ACP counseling assistance, Transitioned to hospice      Patient Active Problem List   Diagnosis Date Noted  . Palliative care by specialist   . Intracranial hemorrhage ([place]) 01/18/2019  . Constipation 08/31/2015  . Terminal care 01/13/2015  . Atrial tachycardia ([place]) 01/13/2015  . Anticoagulated on Coumadin 11/20/2012  . Chest pain 02/10/2012  . Hyperlipidemia 05/28/2010  . HYPERTENSION, BENIGN 10/31/2009  . Atrial fibrillation ([place]) 10/31/2009  . SVT/ PSVT/ PAT 09/04/2009    Palliative Care Assessment & Plan   Patient Profile: 85 y.o. female  with past medical history of atrial fibrillation on Xarelto, HTN, HLD, hiatal hernia, GERD, arthritis, hypothyroidism admitted on 01/18/2019 after found slumped over in chair by family. Hospital admission for intracerebral hemorrhage with intraventricular extension and mass effect, 45mm right to left midline shift with  associated intraventricular hemorrhage and evidence of early hydrocephalus. Nephrology consulted. Patient unresponsive with poor prognosis. New onset fevers, possibly neurogenic but could have aspiration pneumonitis/pneumonia. Palliative medicine consultation for goals of care.   Assessment: Intracerebral hemorrhage with midline shift Left hemiplegia Unresponsiveness Fever AKI with underlying CKD stage III Afib RVR  Recommendations/Plan:  Comfort measures only. Interventions not aimed at comfort have been discontinued.   Continue prn PO/SL comfort meds.   Comfort bites/sips per patient/family request. Continue good oral care.   TOC RN/SW following for disposition to hospice facility. Stable for transfer today if bed available.   Goals of Care and Additional Recommendations:  Limitations on Scope of Treatment: Full Comfort Care  Code Status: DNR   Code Status Orders  (From admission, onward)         Start     Ordered   01/18/19 2031  Do not attempt resuscitation (DNR)  Continuous    Question Answer Comment  In the event of cardiac or respiratory ARREST Do not call a "code blue"   In the event of cardiac or respiratory ARREST Do not perform Intubation, CPR, defibrillation or ACLS   In the event of cardiac or respiratory ARREST Use medication by any route, position, wound care, and other measures to relive pain and suffering. May use oxygen, suction and manual treatment of airway obstruction as needed for comfort.      01/18/19 2030        Code Status History    Date Active Date Inactive Code Status Order ID Comments User Context   01/13/2015 1533 01/14/2015 1738 Full Code [id number]  Patsey Berthold, NP Inpatient   Advance Care Planning Activity    Advance Directive Documentation     Most Recent Value  Type of Advance Directive  Out of facility DNR (pink MOST or yellow form)  Pre-existing out of facility DNR order (yellow form or pink MOST form)  Yellow form placed in  chart (order not valid for inpatient use)  "MOST" Form in Place?  -       Prognosis:   < 2 weeks if not days  Discharge  Planning:  Hospice facility  Care plan was discussed with granddaughter at bedside  Thank you for allowing the Palliative Medicine Team to assist in the care of this patient.   Time In: 1015- Time Out: 1035 Total Time 20 Prolonged Time Billed no      Greater than 50%  of this time was spent counseling and coordinating care related to the above assessment and plan.  Ihor Dow, FNP-C Palliative Medicine Team  Phone: [phone] Fax: [phone]  Please contact Palliative Medicine Team phone at [phone] for questions and concerns.

## 2019-01-20 NOTE — Progress Notes (Signed)
[facility] [place] does not have bed availability today.  Will updated as bed status changes.  Thank you, Venia Carbon RN, BSN, [facility] Liaison (in [place]) [phone]

## 2019-01-20 NOTE — Progress Notes (Signed)
Nutrition Brief Note  Chart reviewed. Pt now transitioning to comfort care.  No further nutrition interventions warranted at this time.  Please re-consult as needed.   Ernst Cumpston A. Jaymison Luber, RD, LDN, CDCES Registered Dietitian II Certified Diabetes Care and Education Specialist Pager: [phone] After hours Pager: [phone]  

## 2019-01-20 NOTE — Progress Notes (Signed)
PROGRESS NOTE   [name]  POE:[id number]    DOB: 04/27/1925    DOA: 01/18/2019  PCP: Jani Gravel, MD   I [facility] briefly [facility]  Brief Narrative:  85 year old widowed female, lives alone independently, PMH of persistent atrial fibrillation on Xarelto, HTN, HLD, hypothyroid, GERD, presented to [facility] ED via EMS on 6/22 after daughter found her slumped over in the chair at the kitchen table on day of admission.  Admitted for large intracerebral hemorrhage with intraventricular extension and mass-effect.  Extremely poor prognosis.  Full comfort care.  PMT consulted. Patient has been placed in comfort care measures, as she has lost IV access and getting medications sublingually.    Subjective: Patient is resting appears calm and comfortable, daughter at the bedside.  She has lost IV access and now getting medication sublingually.  Assessment & Plan:   Active Problems:   Terminal care   Intracranial hemorrhage ([place])   Palliative care by specialist   Intracerebral hemorrhage  In the context of Xarelto.  CT head: Acute intraparenchymal hemorrhage in the right thalamus estimated volume 36 cc with associated regional mass-effect and 6 mm right to left midline shift.  Associated intraventricular hemorrhage with evidence of early hydrocephalus.  Significant scalp edema noted so not sure if patient sustained a fall and had head injury.  Associated left hemiplegia and unresponsiveness.  Neurology input appreciated: Poor prognosis and recommend PMT consult- awaiting input.  Previous attending had discussed in detail with patient's daughter at bedside, advised her regarding her extreme poor prognosis and suspect that she will likely not make it out alive through this and even if she does, she will be left with severe disabilities both physically and cognitively.  Daughter at this time confirms DNR and full comfort care.  She agreed to  discontinuing all measures not pertaining to comfort-discontinued IV fluids, labs, medications nonessential to comfort.  This was witnessed by patient's RN at bedside. She requests PMT input which is pending at this time.  Disposition: If rapidly declines, hospital death versus home or residential hospice.  Daughter is aware of hospice from her father's demise approximately 5 years ago and inquires regarding [facility].  CSW consulted for residential hospice.  Fever  Possibly neurogenic but could have aspiration pneumonitis/pneumonia due to mental status changes, dysphagia from [place] and vomiting that she had PTA.  Supportive care.  No antibiotics due to comfort care.  Acute kidney injury complicating stage III chronic kidney disease/mild hyperkalemia  Noted.  A. fib with RVR  Now full comfort care.  HTN  Controlled.  HLD  Leukocytosis   DVT prophylaxis: None due to comfort care path. Code Status: DNR, confirmed with daughter at bedside. Family Communication: Discussed w daughter at bedside. Disposition: awaiting hospice placement.Hospital death versus home/residential hospice.  Daughter interested in [facility].  Consultants:  Neurology PMT  Procedures:  None  Antimicrobials:  None   Subjective: Patient unresponsive and unable to provide any history.  As per RN and daughter at bedside, does not appear to be in any pain.  Had fever of 102.2 F and got rectal Tylenol for same.  ROS: Unable due to mental status changes.  Objective:  Vitals:   01/20/19 0303 01/20/19 0455 01/20/19 0556 01/20/19 0745  BP:    (!) 144/100  Pulse:    76  Resp: (!) 26 (!) 26 (!) 24 18  Temp:    99.6 F (37.6 C)  TempSrc:  Oral  SpO2:    94%  Weight:      Height:        Examination: General exam: Calm, comfortable, sleeping did not disturb per request.  Per daughter patient is able to open eyes and recognize her  HEENT:Oral mucosa moist, Ear/Nose WNL grossly, dentition  normal. Respiratory system: Bilateral equal air entry, no crackles and wheezing, no use of accessory muscle, non tender on palpation. Cardiovascular system: regular rate and rhythm, S1 & S2 heard, No JVD/murmurs. Gastrointestinal system: Abdomen soft, non-tender, non-distended, BS +. No hepatosplenomegaly palpable. Nervous System: No eye opening to sound, did not disturb per request for her comfort. Known left flaccid hemiplegia  Extremities: No edema, distal peripheral pulses palpable.  Skin: No rashes,no icterus. MSK: Normal muscle bulk,tone, power.  Data Reviewed: I have personally reviewed following labs and imaging studies  CBC: Recent Labs  Lab 01/18/19 1646 01/18/19 1652  WBC 18.9*  --   NEUTROABS 16.2*  --   HGB 16.4* 18.4*  HCT 51.7* 54.0*  MCV 81.4  --   PLT 272  --    Basic Metabolic Panel: Recent Labs  Lab 01/18/19 1646 01/18/19 1652 01/18/19 2225  NA 137 135  --   K 5.8* 5.8* 5.7*  CL 101 103  --   CO2 22  --   --   GLUCOSE 149* 146*  --   BUN 38* 51*  --   CREATININE 1.69* 1.40*  --   CALCIUM 9.8  --   --    Liver Function Tests: Recent Labs  Lab 01/18/19 1646  AST 144*  ALT 42  ALKPHOS 117  BILITOT 1.5*  PROT 7.0  ALBUMIN 4.1   Coagulation Profile: Recent Labs  Lab 01/18/19 1646  INR 1.0   Cardiac Enzymes: No results for input(s): CKTOTAL, CKMB, CKMBINDEX, TROPONINI in the last 168 hours. HbA1C: No results for input(s): HGBA1C in the last 72 hours. CBG: Recent Labs  Lab 01/18/19 1644 01/18/19 2109  GLUCAP 151* 119*    Recent Results (from the past 240 hour(s))  SARS Coronavirus 2 (CEPHEID - Performed in [facility] hospital lab), Hosp Order     Status: None   Collection Time: 01/18/19  5:26 PM   Specimen: Nasopharyngeal Swab  Result Value Ref Range Status   SARS Coronavirus 2 NEGATIVE NEGATIVE Final    Comment: (NOTE) If result is NEGATIVE SARS-CoV-2 target nucleic acids are NOT DETECTED. The SARS-CoV-2 RNA is generally  detectable in upper and lower  respiratory specimens during the acute phase of infection. The lowest  concentration of SARS-CoV-2 viral copies this assay can detect is 250  copies / mL. A negative result does not preclude SARS-CoV-2 infection  and should not be used as the sole basis for treatment or other  patient management decisions.  A negative result may occur with  improper specimen collection / handling, submission of specimen other  than nasopharyngeal swab, presence of viral mutation(s) within the  areas targeted by this assay, and inadequate number of viral copies  (<250 copies / mL). A negative result must be combined with clinical  observations, patient history, and epidemiological information. If result is POSITIVE SARS-CoV-2 target nucleic acids are DETECTED. The SARS-CoV-2 RNA is generally detectable in upper and lower  respiratory specimens dur ing the acute phase of infection.  Positive  results are indicative of active infection with SARS-CoV-2.  Clinical  correlation with patient history and other diagnostic information is  necessary to determine patient infection status.  Positive results do  not rule out bacterial infection or co-infection with other viruses. If result is PRESUMPTIVE POSTIVE SARS-CoV-2 nucleic acids MAY BE PRESENT.   A presumptive positive result was obtained on the submitted specimen  and confirmed on repeat testing.  While 2019 novel coronavirus  (SARS-CoV-2) nucleic acids may be present in the submitted sample  additional confirmatory testing may be necessary for epidemiological  and / or clinical management purposes  to differentiate between  SARS-CoV-2 and other Sarbecovirus currently known to infect humans.  If clinically indicated additional testing with an alternate test  methodology [phone]) is advised. The SARS-CoV-2 RNA is generally  detectable in upper and lower respiratory sp ecimens during the acute  phase of infection. The  expected result is Negative. Fact Sheet for Patients:  StrictlyIdeas.no Fact Sheet for Healthcare Providers: BankingDealers.co.za This test is not yet approved or cleared by the [place] FDA and has been authorized for detection and/or diagnosis of SARS-CoV-2 by FDA under an Emergency Use Authorization (EUA).  This EUA will remain in effect (meaning this test can be used) for the duration of the COVID-19 declaration under Section 564(b)(1) of the Act, 21 U.S.C. section 360bbb-3(b)(1), unless the authorization is terminated or revoked sooner. Performed at [facility], [place] [address] [place], [place] [address]          Radiology Studies: Ct Cervical Spine Wo Contrast  Result Date: 01/18/2019 CLINICAL DATA:  Code stroke. Initial evaluation for acute core stroke. Left-sided deficits. EXAM: CT HEAD WITHOUT CONTRAST CT CERVICAL SPINE WITHOUT CONTRAST TECHNIQUE: Multidetector CT imaging of the head and cervical spine was performed following the standard protocol without intravenous contrast. Multiplanar CT image reconstructions of the cervical spine were also generated. COMPARISON:  Prior CT from 08/25/2015. FINDINGS: CT HEAD FINDINGS Brain: Acute intraparenchymal hemorrhage centered at the right thalamus measures 3.8 x 3.8 x 5.0 cm (estimated volume 36 cc). Localized vasogenic edema with mild regional mass effect. Associated right-to-left midline shift measures up to 6 mm. Associated intraventricular extension with blood seen within the lateral and third ventricles. Dilatation of the temporal horns of both lateral ventricles compatible with associated early hydrocephalus. No ventricular trapping. Basilar cisterns remain patent. No other acute intracranial hemorrhage. No other acute large vessel territory infarct. Underlying atrophy with advanced chronic microvascular ischemic disease. Scattered superimposed remote lacunar infarcts within the  bilateral basal ganglia and left thalamus. No appreciable mass lesion. No extra-axial fluid collection. Vascular: No asymmetric hyperdense vessel. Scattered vascular calcifications noted within the carotid siphons. Skull: Diffuse soft tissue edema seen throughout the scalp and left greater than right face. Calvarium intact. Sinuses/Orbits: Globes and orbital soft tissues demonstrate no acute finding. Scattered mucosal thickening within the ethmoidal air cells. Small air-fluid levels noted within the right maxillary and left sphenoid sinuses. No mastoid effusion. Other: None. CT CERVICAL SPINE FINDINGS Alignment: Straightening of the normal cervical lordosis. Grade 1 anterolisthesis of C2 on C3 and C7 on T1, with grade 1 retrolisthesis of C3 on C4 and C5 on C6. Findings likely chronic and facet mediated. Skull base and vertebrae: Skull base intact. Normal C1-2 articulations are preserved in the dens is intact. Vertebral body heights maintained. No acute fracture. Focal lucency extending through the left lateral mass of C2 on coronal image 32 favored to be chronic in nature. Soft tissues and spinal canal: Diffuse soft tissue edema extends from the scalp into the left lateral and posterior neck. Diffuse prevertebral edema, suspected to be related to the scalp edema.  Prominent vascular calcifications about the carotid bifurcations. 8 mm hypodense right thyroid nodule noted, of doubtful significance. Spinal canal within normal limits. Disc levels: Moderate multilevel cervical spondylolysis at C3-4 through C7-T1. C4 and C5 vertebral bodies are largely ankylosed. Upper chest: Visualized upper chest demonstrates no acute finding. 4 mm nodule present at the right upper lobe (series 9, image 92). Few additional 4 mm nodules noted within the visualized peripheral left upper lobe, indeterminate. Partially visualized lungs are otherwise clear. Other: None. IMPRESSION: CT BRAIN: 1. Acute intraparenchymal hemorrhage centered at  the right thalamus, estimated volume 36 cc, with associated regional mass effect and 6 mm right-to-left midline shift. 2. Associated intraventricular hemorrhage with evidence for early hydrocephalus. 3. Diffuse scalp edema extending into the left greater than right face. 4. Underlying atrophy with advanced chronic microvascular ischemic disease. CT CERVICAL SPINE: 1. No CT evidence for acute fracture or other definite traumatic injury within the cervical spine. 2. Diffuse soft tissue edema extending from the scalp into the left neck, with associated diffuse prevertebral edema. While the prevertebral edema is favored to be related to the diffuse soft tissue edema seen elsewhere within the head and neck, possible superimposed ligamentous injury could also have this appearance. If there is high clinical suspicion for possible occult ligamentous injury, then further evaluation with dedicated MRI would be warranted. 3. Moderate cervical spondylolysis at C3-4 through C7-T1. 4. Multiple scattered subcentimeter pulmonary nodules measuring up to 4 mm within the visualized upper lungs, indeterminate. No follow-up needed if patient is low-risk (and has no known or suspected primary neoplasm). Non-contrast chest CT can be considered in 12 months if patient is high-risk. This recommendation follows the consensus statement: Guidelines for Management of Incidental Pulmonary Nodules Detected on CT Images: From the Fleischner Society 2017; Radiology 2017; 284:228-243. These results were communicated to Dr. Lorraine Lax at [place] 6/22/2020by text page via the [facility] messaging system. Electronically Signed   By: Jeannine Boga M.D.   On: 01/18/2019 17:37   Ct Head Code Stroke Wo Contrast  Result Date: 01/18/2019 CLINICAL DATA:  Code stroke. Initial evaluation for acute core stroke. Left-sided deficits. EXAM: CT HEAD WITHOUT CONTRAST CT CERVICAL SPINE WITHOUT CONTRAST TECHNIQUE: Multidetector CT imaging of the head and cervical  spine was performed following the standard protocol without intravenous contrast. Multiplanar CT image reconstructions of the cervical spine were also generated. COMPARISON:  Prior CT from 08/25/2015. FINDINGS: CT HEAD FINDINGS Brain: Acute intraparenchymal hemorrhage centered at the right thalamus measures 3.8 x 3.8 x 5.0 cm (estimated volume 36 cc). Localized vasogenic edema with mild regional mass effect. Associated right-to-left midline shift measures up to 6 mm. Associated intraventricular extension with blood seen within the lateral and third ventricles. Dilatation of the temporal horns of both lateral ventricles compatible with associated early hydrocephalus. No ventricular trapping. Basilar cisterns remain patent. No other acute intracranial hemorrhage. No other acute large vessel territory infarct. Underlying atrophy with advanced chronic microvascular ischemic disease. Scattered superimposed remote lacunar infarcts within the bilateral basal ganglia and left thalamus. No appreciable mass lesion. No extra-axial fluid collection. Vascular: No asymmetric hyperdense vessel. Scattered vascular calcifications noted within the carotid siphons. Skull: Diffuse soft tissue edema seen throughout the scalp and left greater than right face. Calvarium intact. Sinuses/Orbits: Globes and orbital soft tissues demonstrate no acute finding. Scattered mucosal thickening within the ethmoidal air cells. Small air-fluid levels noted within the right maxillary and left sphenoid sinuses. No mastoid effusion. Other: None. CT CERVICAL SPINE FINDINGS Alignment: Straightening of the  normal cervical lordosis. Grade 1 anterolisthesis of C2 on C3 and C7 on T1, with grade 1 retrolisthesis of C3 on C4 and C5 on C6. Findings likely chronic and facet mediated. Skull base and vertebrae: Skull base intact. Normal C1-2 articulations are preserved in the dens is intact. Vertebral body heights maintained. No acute fracture. Focal lucency  extending through the left lateral mass of C2 on coronal image 32 favored to be chronic in nature. Soft tissues and spinal canal: Diffuse soft tissue edema extends from the scalp into the left lateral and posterior neck. Diffuse prevertebral edema, suspected to be related to the scalp edema. Prominent vascular calcifications about the carotid bifurcations. 8 mm hypodense right thyroid nodule noted, of doubtful significance. Spinal canal within normal limits. Disc levels: Moderate multilevel cervical spondylolysis at C3-4 through C7-T1. C4 and C5 vertebral bodies are largely ankylosed. Upper chest: Visualized upper chest demonstrates no acute finding. 4 mm nodule present at the right upper lobe (series 9, image 92). Few additional 4 mm nodules noted within the visualized peripheral left upper lobe, indeterminate. Partially visualized lungs are otherwise clear. Other: None. IMPRESSION: CT BRAIN: 1. Acute intraparenchymal hemorrhage centered at the right thalamus, estimated volume 36 cc, with associated regional mass effect and 6 mm right-to-left midline shift. 2. Associated intraventricular hemorrhage with evidence for early hydrocephalus. 3. Diffuse scalp edema extending into the left greater than right face. 4. Underlying atrophy with advanced chronic microvascular ischemic disease. CT CERVICAL SPINE: 1. No CT evidence for acute fracture or other definite traumatic injury within the cervical spine. 2. Diffuse soft tissue edema extending from the scalp into the left neck, with associated diffuse prevertebral edema. While the prevertebral edema is favored to be related to the diffuse soft tissue edema seen elsewhere within the head and neck, possible superimposed ligamentous injury could also have this appearance. If there is high clinical suspicion for possible occult ligamentous injury, then further evaluation with dedicated MRI would be warranted. 3. Moderate cervical spondylolysis at C3-4 through C7-T1. 4.  Multiple scattered subcentimeter pulmonary nodules measuring up to 4 mm within the visualized upper lungs, indeterminate. No follow-up needed if patient is low-risk (and has no known or suspected primary neoplasm). Non-contrast chest CT can be considered in 12 months if patient is high-risk. This recommendation follows the consensus statement: Guidelines for Management of Incidental Pulmonary Nodules Detected on CT Images: From the Fleischner Society 2017; Radiology 2017; 284:228-243. These results were communicated to Dr. Lorraine Lax at [place] 6/22/2020by text page via the [facility] messaging system. Electronically Signed   By: Jeannine Boga M.D.   On: 01/18/2019 17:37        Scheduled Meds: Continuous Infusions:   LOS: 2 days     Antonieta Pert, MD, FACP, [facility]. [facility]  To contact the attending provider between 7A-7P or the covering provider during after hours 7P-7A, please log into the web site [email or web address] and access using universal [place] password for that web site. If you do not have the password, please call the hospital operator.  01/20/2019, 8:20 AM

## 2019-01-20 NOTE — Progress Notes (Signed)
   01/20/19 0949  Clinical Encounter Type  Visited With Patient and family together  Visit Type Initial;Spiritual support  Referral From Palliative care team  Consult/Referral To Chaplain  Spiritual Encounters  Spiritual Needs Prayer  This chaplain responded to PMT request for Pt. and family EOL spiritual care.  The chaplain read the chart before entering the Pt. Room. The chaplain was welcomed into the room by the Pt. Daughter-Linda who was anticipating the Pt. grand-daughter-Jen to arrive at any time. The chaplain listened as [name] shared the Pt. desire and ability to live independently until this time. [name] shared with the chaplain the Pt. openly shared she was tired and was making plans for EOL.  The chaplain and [name] agreed upon the words courage and strength to describe the Pt.  The chaplain accepted the Pt. grand-daughter request for prayer with the Pt. before leaving the Pt. room.  The chaplain is available for F/U spiritual care as needed.

## 2019-01-20 NOTE — Progress Notes (Signed)
Pt has remained comfortably stable today, in bed. Unresponsive, does not follow commands. resp shallow, even, unlabored. Incontinence x 1. Family at bedside.

## 2019-01-20 NOTE — Progress Notes (Signed)
Received call from daughter, [name] and visited with her at bedside. Patient appears uncomfortable, grabbing at neck and chest with facial grimacing. [name] reports her mother does this when she goes into afib and this happened once last night, for which Roxanol relieved symptoms. Instructed RN to administer prn SL Roxanol.   Discussed ongoing plan for comfort measures and EOL expectations. Discussed symptom management medication adjustments to maintain ongoing comfort. Explained that if SL Roxanol does not relieve symptoms, RN can try SQ morphine. [name] would prefer her mother not be stuck again for IV but understands this is also an option if IV comfort medications are needed.    Answered all questions and concerns.   NO CHARGE  [name], [place], FNP-C Palliative Medicine Team  Phone: [phone] Fax: [phone]

## 2019-01-20 NOTE — Plan of Care (Signed)
  Problem: Education: Goal: Knowledge of secondary prevention will improve Outcome: Progressing Goal: Knowledge of patient specific risk factors addressed and post discharge goals established will improve Outcome: Progressing Goal: Individualized Educational Video(s) Outcome: Progressing   Problem: Coping: Goal: Will verbalize positive feelings about self Outcome: Progressing Goal: Will identify appropriate support needs Outcome: Progressing   Problem: Health Behavior/Discharge Planning: Goal: Ability to manage health-related needs will improve Outcome: Progressing   [name], BSN, RN

## 2019-01-21 MED ORDER — MORPHINE SULFATE (CONCENTRATE) 10 MG/0.5ML PO SOLN: 5.0000 mg | ORAL | Status: AC | PRN

## 2019-01-21 MED ORDER — ACETAMINOPHEN 650 MG RE SUPP: 650.0000 mg | RECTAL | 0 refills | Status: AC | PRN

## 2019-01-21 MED ORDER — ATROPINE SULFATE 1 % OP SOLN: 1.0000 [drp] | Freq: Four times a day (QID) | OPHTHALMIC | 12 refills | Status: AC | PRN

## 2019-01-21 NOTE — Plan of Care (Signed)
  Problem: Education: Goal: Knowledge of secondary prevention will improve Outcome: Progressing Goal: Knowledge of patient specific risk factors addressed and post discharge goals established will improve Outcome: Progressing Goal: Individualized Educational Video(s) Outcome: Progressing   Problem: Coping: Goal: Will verbalize positive feelings about self Outcome: Progressing Goal: Will identify appropriate support needs Outcome: Progressing   Problem: Health Behavior/Discharge Planning: Goal: Ability to manage health-related needs will improve Outcome: Progressing   Ival Bible, BSN, RN

## 2019-01-21 NOTE — Progress Notes (Addendum)
Manufacturing engineer [facility])  Required consents obtained for transfer to [facility].  Transport has to be delayed due to an unexpected required repair to the room.  Family is aware.  As soon as transport can occur, will update TOC team and family.  Venia Carbon RN, BSN, [facility] Liaison  [phone]  **115p, [facility] is now ready for the pt to transfer.  Please fax d/c summary to [phone].  RN staff, please call report to [phone].  You may leave indwelling foley in place, if applicable.

## 2019-01-21 NOTE — Progress Notes (Signed)
Daily Progress Note   Patient Name: [name]       Date: 01/21/2019 DOB: [birth year]  Age: 85 y.o. MRN#: [id number] Attending Physician: Antonieta Pert, MD Primary Care Physician: Jani Gravel, MD Admit Date: 01/18/2019  Reason for Consultation/Follow-up: Establishing goals of care  Subjective:  Patient lethargic but appears comfortable with no distress. Respirations regular, shallow. No facial grimacing. No audible secretions.   Daughter, [name] at bedside. She shares that her mother required morphine x2 last night and atropine drops x2. We discussed scheduling morphine. [name] is ok with current prn regimen.   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 Emotional support provided. Per RN, RN CM will discuss with daughter this morning.   Discussed plan for comfort and EOL expectations. Discussed comfort sips and good oral care. [name] is prepared for 'anything to happen at anytime' and has told her mother it is "ok" when she is ready.   Therapeutic listening. Emotional/spiritual support provided.    Length of Stay: 3  Current Medications: Scheduled Meds:    Continuous Infusions:   PRN Meds: acetaminophen **OR** acetaminophen (TYLENOL) oral liquid 160 mg/5 mL **OR** acetaminophen, atropine, LORazepam, LORazepam, metoprolol tartrate, morphine injection, morphine CONCENTRATE, ondansetron  Physical Exam Vitals signs and nursing note reviewed.  Constitutional:      Appearance: She is ill-appearing.  Cardiovascular:     Rate and Rhythm: Rhythm regularly irregular.  Pulmonary:     Effort: No tachypnea, accessory muscle usage or respiratory distress.     Breath sounds: Normal breath sounds.  Skin:    General: Skin is warm and dry.     Findings:  Ecchymosis present.  Neurological:     Mental Status: She is lethargic.            Vital Signs: BP (!) 172/96 (BP Location: Right Arm)   Pulse (!) 119   Temp 97.6 F (36.4 C) (Axillary)   Resp 18   Ht 5\' 2"  (1.575 m)   Wt 50.2 kg   SpO2 97%   BMI 20.24 kg/m  SpO2: SpO2: 97 % O2 Device: O2 Device: Room Air O2 Flow Rate: O2 Flow Rate (L/min): 1 L/min  Intake/output summary:   Intake/Output Summary (Last 24 hours) at 01/21/2019 1002 Last data filed at 01/21/2019 0810 Gross  per 24 hour  Intake 0 ml  Output -  Net 0 ml   LBM: Last BM Date: (pt unable to report) Baseline Weight: Weight: 50.2 kg Most recent weight: Weight: 50.2 kg       Palliative Assessment/Data: PPS 10%    Flowsheet Rows     Most Recent Value  Intake Tab  Referral Department  Hospitalist  Unit at Time of Referral  Med/Surg Unit  Palliative Care Primary Diagnosis  Neurology  Date Notified  01/19/19  Palliative Care Type  New Palliative care  Reason for referral  Clarify Goals of Care  Date first seen by Palliative Care  01/19/19  # of days Palliative referral response time  0 Day(s)  Clinical Assessment  Palliative Performance Scale Score  10%  Psychosocial & Spiritual Assessment  Palliative Care Outcomes  Patient/Family meeting held?  Yes  Who was at the meeting?  daughter, granddaughter, grandson  Palliative Care Outcomes  Counseled regarding hospice, Provided end of life care assistance, Provided psychosocial or spiritual support, Improved pain interventions, Improved non-pain symptom therapy, Clarified goals of care, ACP counseling assistance, Transitioned to hospice      Patient Active Problem List   Diagnosis Date Noted  . Palliative care by specialist   . Intracranial hemorrhage ([place]) 01/18/2019  . Constipation 08/31/2015  . Terminal care 01/13/2015  . Atrial tachycardia ([place]) 01/13/2015  . Anticoagulated on Coumadin 11/20/2012  . Chest pain 02/10/2012  . Hyperlipidemia 05/28/2010  .  HYPERTENSION, BENIGN 10/31/2009  . Atrial fibrillation ([place]) 10/31/2009  . SVT/ PSVT/ PAT 09/04/2009    Palliative Care Assessment & Plan   Patient Profile: 85 y.o. female  with past medical history of atrial fibrillation on Xarelto, HTN, HLD, hiatal hernia, GERD, arthritis, hypothyroidism admitted on 01/18/2019 after found slumped over in chair by family. Hospital admission for intracerebral hemorrhage with intraventricular extension and mass effect, 55mm right to left midline shift with associated intraventricular hemorrhage and evidence of early hydrocephalus. Nephrology consulted. Patient unresponsive with poor prognosis. New onset fevers, possibly neurogenic but could have aspiration pneumonitis/pneumonia. Palliative medicine consultation for goals of care.   Assessment: Intracerebral hemorrhage with midline shift Left hemiplegia Unresponsiveness Fever AKI with underlying CKD stage III Afib RVR  Recommendations/Plan:  Comfort measures only. Interventions not aimed at comfort have been discontinued.   Continue prn PO/SL comfort meds.   Comfort bites/sips per patient/family request. Continue good oral care.   TOC RN/SW following for disposition to hospice facility. Daughter requesting [facility]. Stable for transfer today if bed available.   Goals of Care and Additional Recommendations:  Limitations on Scope of Treatment: Full Comfort Care  Code Status: DNR   Code Status Orders  (From admission, onward)         Start     Ordered   01/18/19 2031  Do not attempt resuscitation (DNR)  Continuous    Question Answer Comment  In the event of cardiac or respiratory ARREST Do not call a "code blue"   In the event of cardiac or respiratory ARREST Do not perform Intubation, CPR, defibrillation or ACLS   In the event of cardiac or respiratory ARREST Use medication by any route, position, wound care, and other measures to relive pain and suffering. May use oxygen, suction and  manual treatment of airway obstruction as needed for comfort.      01/18/19 2030        Code Status History    Date Active Date Inactive Code Status Order ID Comments  User Context   01/13/2015 1533 01/14/2015 1738 Full Code [id number]  Patsey Berthold, NP Inpatient   Advance Care Planning Activity    Advance Directive Documentation     Most Recent Value  Type of Advance Directive  Out of facility DNR (pink MOST or yellow form)  Pre-existing out of facility DNR order (yellow form or pink MOST form)  Yellow form placed in chart (order not valid for inpatient use)  "MOST" Form in Place?  -       Prognosis:   < 2 weeks if not days  Discharge Planning:  Hospice facility  Care plan was discussed with daughter  Thank you for allowing the Palliative Medicine Team to assist in the care of this patient.   Time In: 0900- Time Out: 0940 Total Time 40 Prolonged Time Billed no      Greater than 50%  of this time was spent counseling and coordinating care related to the above assessment and plan.  Ihor Dow, FNP-C Palliative Medicine Team  Phone: [phone] Fax: [phone]  Please contact Palliative Medicine Team phone at [phone] for questions and concerns.

## 2019-01-21 NOTE — Discharge Summary (Signed)
Physician Discharge Summary  [name] WPY:[id number] DOB: February 18, 1925 DOA: 01/18/2019  PCP: Jani Gravel, MD  Admit date: 01/18/2019 Discharge date: 01/21/2019  Admitted From: Home Disposition: Hospice house become place  Recommendations for Outpatient Follow-up:  Follow-up with hospice  Discharge Condition:poor  CODE STATUS:DNR  Diet recommendation: as tolerated  Brief/Interim Summary: 85 year old widowed female, lives alone independently, PMH of persistent atrial fibrillation on Xarelto, HTN, HLD, hypothyroid, GERD, presented to [facility] ED via EMS on 6/22 after daughter found her slumped over in the chair at the kitchen table on day of admission.  Admitted for large intracerebral hemorrhage with intraventricular extension and mass-effect.  Extremely poor prognosis.  Full comfort care.  PMT consulted. Patient has been placed in comfort care measures, as she has lost IV access and getting medications sublingually.     Discharge Diagnoses:  Active Problems:   Terminal care   Intracranial hemorrhage ([place])   Palliative care by specialist  Intracerebral hemorrhage  In the context of Xarelto.  CT head: Acute intraparenchymal hemorrhage in the right thalamus estimated volume 36 cc with associated regional mass-effect and 6 mm right to left midline shift.  Associated intraventricular hemorrhage with evidence of early hydrocephalus.  Significant scalp edema noted so not sure if patient sustained a fall and had head injury.  Associated left hemiplegia and unresponsiveness.  Neurology input appreciated: Poor prognosis.  Previous attending had discussed in detail with patient's daughter at bedside, advised her regarding her extreme poor prognosis and suspect that she will likely not make it out alive through this and even if she does, she will be left with severe disabilities both physically and cognitively.  Daughter at this time confirms DNR and full comfort care.  She agreed to  discontinuing all measures not pertaining to comfort-discontinued IV fluids, labs, medications nonessential to comfort.  This was witnessed by patient's RN at bedside. She was seen by palliative care team.  Patient was placed on comfort measures medications sublingually currently no IV access and family wants to avoid IV stick to minimize pain and discomfort.    Disposition: Hospice place has been arranged and patient is being discharged today to continue on terminal comfort care measures.  Fever  Possibly neurogenic but could have aspiration pneumonitis/pneumonia due to mental status changes, dysphagia from [place] and vomiting that she had PTA.  Supportive care.  No antibiotics due to comfort care.  Acute kidney injury complicating stage III chronic kidney disease/mild hyperkalemia  Noted.  A. fib with RVR  Now full comfort care.  HTN HLD Leukocytosis  DVT prophylaxis: None due to comfort care path. Code Status: DNR, confirmed with daughter at bedside. Family Communication: Discussed w daughter at bedside. Disposition: awaiting hospice placement.Hospital death versus home/residential hospice.  Daughter interested in [facility].  Consultants:  Neurology PMT  Procedures:  None  Antimicrobials:  None  Discharge Instructions  Allergies as of 01/21/2019      Reactions   Amiodarone Nausea And Vomiting   Only at higher dose.  Able to tolerate lower dose   Amlodipine Other (See Comments)   unknown   Dabigatran Other (See Comments)   Unknown, per pt   Eliquis [apixaban] Other (See Comments)   Made pt feel strange   Sulfonamide Derivatives    REACTION: hives      Medication List    STOP taking these medications   cholecalciferol 25 MCG (1000 UT) tablet Commonly known as: VITAMIN D3   diltiazem 180 MG 24 hr capsule Commonly known as:  CARDIZEM CD   diltiazem 30 MG tablet Commonly known as: Cardizem   Incruse Ellipta 62.5 MCG/INH Aepb Generic drug:  umeclidinium bromide   labetalol 100 MG tablet Commonly known as: NORMODYNE   levothyroxine 50 MCG tablet Commonly known as: SYNTHROID   meclizine 25 MG tablet Commonly known as: ANTIVERT   omeprazole 40 MG capsule Commonly known as: PRILOSEC   sertraline 50 MG tablet Commonly known as: ZOLOFT   Viteyes AREDS Formula/Lutein Caps   Xarelto 15 MG Tabs tablet Generic drug: Rivaroxaban     TAKE these medications   acetaminophen 650 MG suppository Commonly known as: TYLENOL Place 1 suppository (650 mg total) rectally every 4 (four) hours as needed for mild pain or fever.   atropine 1 % ophthalmic solution Place 1-2 drops under the tongue 4 (four) times daily as needed (secretions).   morphine CONCENTRATE 10 MG/0.5ML Soln concentrated solution Place 0.25 mLs (5 mg total) under the tongue every 3 (three) hours as needed for up to 3 doses for moderate pain, severe pain or shortness of breath (dyspnea/air hunger).       Allergies  Allergen Reactions  . Amiodarone Nausea And Vomiting    Only at higher dose.  Able to tolerate lower dose  . Amlodipine Other (See Comments)    unknown  . Dabigatran Other (See Comments)    Unknown, per pt   . Eliquis [Apixaban] Other (See Comments)    Made pt feel strange  . Sulfonamide Derivatives     REACTION: hives     Procedures/Studies: Ct Cervical Spine Wo Contrast  Result Date: 01/18/2019 CLINICAL DATA:  Code stroke. Initial evaluation for acute core stroke. Left-sided deficits. EXAM: CT HEAD WITHOUT CONTRAST CT CERVICAL SPINE WITHOUT CONTRAST TECHNIQUE: Multidetector CT imaging of the head and cervical spine was performed following the standard protocol without intravenous contrast. Multiplanar CT image reconstructions of the cervical spine were also generated. COMPARISON:  Prior CT from 08/25/2015. FINDINGS: CT HEAD FINDINGS Brain: Acute intraparenchymal hemorrhage centered at the right thalamus measures 3.8 x 3.8 x 5.0 cm (estimated  volume 36 cc). Localized vasogenic edema with mild regional mass effect. Associated right-to-left midline shift measures up to 6 mm. Associated intraventricular extension with blood seen within the lateral and third ventricles. Dilatation of the temporal horns of both lateral ventricles compatible with associated early hydrocephalus. No ventricular trapping. Basilar cisterns remain patent. No other acute intracranial hemorrhage. No other acute large vessel territory infarct. Underlying atrophy with advanced chronic microvascular ischemic disease. Scattered superimposed remote lacunar infarcts within the bilateral basal ganglia and left thalamus. No appreciable mass lesion. No extra-axial fluid collection. Vascular: No asymmetric hyperdense vessel. Scattered vascular calcifications noted within the carotid siphons. Skull: Diffuse soft tissue edema seen throughout the scalp and left greater than right face. Calvarium intact. Sinuses/Orbits: Globes and orbital soft tissues demonstrate no acute finding. Scattered mucosal thickening within the ethmoidal air cells. Small air-fluid levels noted within the right maxillary and left sphenoid sinuses. No mastoid effusion. Other: None. CT CERVICAL SPINE FINDINGS Alignment: Straightening of the normal cervical lordosis. Grade 1 anterolisthesis of C2 on C3 and C7 on T1, with grade 1 retrolisthesis of C3 on C4 and C5 on C6. Findings likely chronic and facet mediated. Skull base and vertebrae: Skull base intact. Normal C1-2 articulations are preserved in the dens is intact. Vertebral body heights maintained. No acute fracture. Focal lucency extending through the left lateral mass of C2 on coronal image 32 favored to be chronic in nature. Soft  tissues and spinal canal: Diffuse soft tissue edema extends from the scalp into the left lateral and posterior neck. Diffuse prevertebral edema, suspected to be related to the scalp edema. Prominent vascular calcifications about the carotid  bifurcations. 8 mm hypodense right thyroid nodule noted, of doubtful significance. Spinal canal within normal limits. Disc levels: Moderate multilevel cervical spondylolysis at C3-4 through C7-T1. C4 and C5 vertebral bodies are largely ankylosed. Upper chest: Visualized upper chest demonstrates no acute finding. 4 mm nodule present at the right upper lobe (series 9, image 92). Few additional 4 mm nodules noted within the visualized peripheral left upper lobe, indeterminate. Partially visualized lungs are otherwise clear. Other: None. IMPRESSION: CT BRAIN: 1. Acute intraparenchymal hemorrhage centered at the right thalamus, estimated volume 36 cc, with associated regional mass effect and 6 mm right-to-left midline shift. 2. Associated intraventricular hemorrhage with evidence for early hydrocephalus. 3. Diffuse scalp edema extending into the left greater than right face. 4. Underlying atrophy with advanced chronic microvascular ischemic disease. CT CERVICAL SPINE: 1. No CT evidence for acute fracture or other definite traumatic injury within the cervical spine. 2. Diffuse soft tissue edema extending from the scalp into the left neck, with associated diffuse prevertebral edema. While the prevertebral edema is favored to be related to the diffuse soft tissue edema seen elsewhere within the head and neck, possible superimposed ligamentous injury could also have this appearance. If there is high clinical suspicion for possible occult ligamentous injury, then further evaluation with dedicated MRI would be warranted. 3. Moderate cervical spondylolysis at C3-4 through C7-T1. 4. Multiple scattered subcentimeter pulmonary nodules measuring up to 4 mm within the visualized upper lungs, indeterminate. No follow-up needed if patient is low-risk (and has no known or suspected primary neoplasm). Non-contrast chest CT can be considered in 12 months if patient is high-risk. This recommendation follows the consensus statement:  Guidelines for Management of Incidental Pulmonary Nodules Detected on CT Images: From the Fleischner Society 2017; Radiology 2017; 284:228-243. These results were communicated to Dr. Lorraine Lax at [place] 6/22/2020by text page via the [facility] messaging system. Electronically Signed   By: Jeannine Boga M.D.   On: 01/18/2019 17:37   Ct Head Code Stroke Wo Contrast  Result Date: 01/18/2019 CLINICAL DATA:  Code stroke. Initial evaluation for acute core stroke. Left-sided deficits. EXAM: CT HEAD WITHOUT CONTRAST CT CERVICAL SPINE WITHOUT CONTRAST TECHNIQUE: Multidetector CT imaging of the head and cervical spine was performed following the standard protocol without intravenous contrast. Multiplanar CT image reconstructions of the cervical spine were also generated. COMPARISON:  Prior CT from 08/25/2015. FINDINGS: CT HEAD FINDINGS Brain: Acute intraparenchymal hemorrhage centered at the right thalamus measures 3.8 x 3.8 x 5.0 cm (estimated volume 36 cc). Localized vasogenic edema with mild regional mass effect. Associated right-to-left midline shift measures up to 6 mm. Associated intraventricular extension with blood seen within the lateral and third ventricles. Dilatation of the temporal horns of both lateral ventricles compatible with associated early hydrocephalus. No ventricular trapping. Basilar cisterns remain patent. No other acute intracranial hemorrhage. No other acute large vessel territory infarct. Underlying atrophy with advanced chronic microvascular ischemic disease. Scattered superimposed remote lacunar infarcts within the bilateral basal ganglia and left thalamus. No appreciable mass lesion. No extra-axial fluid collection. Vascular: No asymmetric hyperdense vessel. Scattered vascular calcifications noted within the carotid siphons. Skull: Diffuse soft tissue edema seen throughout the scalp and left greater than right face. Calvarium intact. Sinuses/Orbits: Globes and orbital soft tissues  demonstrate no acute finding. Scattered mucosal  thickening within the ethmoidal air cells. Small air-fluid levels noted within the right maxillary and left sphenoid sinuses. No mastoid effusion. Other: None. CT CERVICAL SPINE FINDINGS Alignment: Straightening of the normal cervical lordosis. Grade 1 anterolisthesis of C2 on C3 and C7 on T1, with grade 1 retrolisthesis of C3 on C4 and C5 on C6. Findings likely chronic and facet mediated. Skull base and vertebrae: Skull base intact. Normal C1-2 articulations are preserved in the dens is intact. Vertebral body heights maintained. No acute fracture. Focal lucency extending through the left lateral mass of C2 on coronal image 32 favored to be chronic in nature. Soft tissues and spinal canal: Diffuse soft tissue edema extends from the scalp into the left lateral and posterior neck. Diffuse prevertebral edema, suspected to be related to the scalp edema. Prominent vascular calcifications about the carotid bifurcations. 8 mm hypodense right thyroid nodule noted, of doubtful significance. Spinal canal within normal limits. Disc levels: Moderate multilevel cervical spondylolysis at C3-4 through C7-T1. C4 and C5 vertebral bodies are largely ankylosed. Upper chest: Visualized upper chest demonstrates no acute finding. 4 mm nodule present at the right upper lobe (series 9, image 92). Few additional 4 mm nodules noted within the visualized peripheral left upper lobe, indeterminate. Partially visualized lungs are otherwise clear. Other: None. IMPRESSION: CT BRAIN: 1. Acute intraparenchymal hemorrhage centered at the right thalamus, estimated volume 36 cc, with associated regional mass effect and 6 mm right-to-left midline shift. 2. Associated intraventricular hemorrhage with evidence for early hydrocephalus. 3. Diffuse scalp edema extending into the left greater than right face. 4. Underlying atrophy with advanced chronic microvascular ischemic disease. CT CERVICAL SPINE: 1. No CT  evidence for acute fracture or other definite traumatic injury within the cervical spine. 2. Diffuse soft tissue edema extending from the scalp into the left neck, with associated diffuse prevertebral edema. While the prevertebral edema is favored to be related to the diffuse soft tissue edema seen elsewhere within the head and neck, possible superimposed ligamentous injury could also have this appearance. If there is high clinical suspicion for possible occult ligamentous injury, then further evaluation with dedicated MRI would be warranted. 3. Moderate cervical spondylolysis at C3-4 through C7-T1. 4. Multiple scattered subcentimeter pulmonary nodules measuring up to 4 mm within the visualized upper lungs, indeterminate. No follow-up needed if patient is low-risk (and has no known or suspected primary neoplasm). Non-contrast chest CT can be considered in 12 months if patient is high-risk. This recommendation follows the consensus statement: Guidelines for Management of Incidental Pulmonary Nodules Detected on CT Images: From the Fleischner Society 2017; Radiology 2017; 284:228-243. These results were communicated to Dr. Lorraine Lax at [place] 6/22/2020by text page via the [employer] messaging system. Electronically Signed   By: Jeannine Boga M.D.   On: 01/18/2019 17:37    Subjective: Resting comfortably this morning.  Overnight had issues with secretion was anxious irritated.  Daughter at the bedside.  Discharge Exam: Vitals:   01/20/19 0745 01/20/19 1929  BP: (!) 144/100 (!) 172/96  Pulse: 76 (!) 119  Resp: 18 18  Temp: 99.6 F (37.6 C) 97.6 F (36.4 C)  SpO2: 94% 97%   Vitals:   01/20/19 0455 01/20/19 0556 01/20/19 0745 01/20/19 1929  BP:   (!) 144/100 (!) 172/96  Pulse:   76 (!) 119  Resp: (!) 26 (!) 24 18 18   Temp:   99.6 F (37.6 C) 97.6 F (36.4 C)  TempSrc:   Oral Axillary  SpO2:   94% 97%  Weight:      Height:        General: Pt is sleeping/resting did not wake her up.    Cardiovascular: RRR, S1/S2 +, no rubs, no gallops Respiratory: CTA bilaterally, no wheezing, no rhonchi Abdominal: Soft, NT, ND, bowel sounds + Extremities: no edema, no cyanosis   The results of significant diagnostics from this hospitalization (including imaging, microbiology, ancillary and laboratory) are listed below for reference.     Microbiology: Recent Results (from the past 240 hour(s))  SARS Coronavirus 2 (CEPHEID - Performed in [place] hospital lab), Hosp Order     Status: None   Collection Time: 01/18/19  5:26 PM   Specimen: Nasopharyngeal Swab  Result Value Ref Range Status   SARS Coronavirus 2 NEGATIVE NEGATIVE Final    Comment: (NOTE) If result is NEGATIVE SARS-CoV-2 target nucleic acids are NOT DETECTED. The SARS-CoV-2 RNA is generally detectable in upper and lower  respiratory specimens during the acute phase of infection. The lowest  concentration of SARS-CoV-2 viral copies this assay can detect is 250  copies / mL. A negative result does not preclude SARS-CoV-2 infection  and should not be used as the sole basis for treatment or other  patient management decisions.  A negative result may occur with  improper specimen collection / handling, submission of specimen other  than nasopharyngeal swab, presence of viral mutation(s) within the  areas targeted by this assay, and inadequate number of viral copies  (<250 copies / mL). A negative result must be combined with clinical  observations, patient history, and epidemiological information. If result is POSITIVE SARS-CoV-2 target nucleic acids are DETECTED. The SARS-CoV-2 RNA is generally detectable in upper and lower  respiratory specimens dur ing the acute phase of infection.  Positive  results are indicative of active infection with SARS-CoV-2.  Clinical  correlation with patient history and other diagnostic information is  necessary to determine patient infection status.  Positive results do  not rule out  bacterial infection or co-infection with other viruses. If result is PRESUMPTIVE POSTIVE SARS-CoV-2 nucleic acids MAY BE PRESENT.   A presumptive positive result was obtained on the submitted specimen  and confirmed on repeat testing.  While 2019 novel coronavirus  (SARS-CoV-2) nucleic acids may be present in the submitted sample  additional confirmatory testing may be necessary for epidemiological  and / or clinical management purposes  to differentiate between  SARS-CoV-2 and other Sarbecovirus currently known to infect humans.  If clinically indicated additional testing with an alternate test  methodology [phone]) is advised. The SARS-CoV-2 RNA is generally  detectable in upper and lower respiratory sp ecimens during the acute  phase of infection. The expected result is Negative. Fact Sheet for Patients:  StrictlyIdeas.no Fact Sheet for Healthcare Providers: BankingDealers.co.za This test is not yet approved or cleared by the [place] FDA and has been authorized for detection and/or diagnosis of SARS-CoV-2 by FDA under an Emergency Use Authorization (EUA).  This EUA will remain in effect (meaning this test can be used) for the duration of the COVID-19 declaration under Section 564(b)(1) of the Act, 21 U.S.C. section 360bbb-3(b)(1), unless the authorization is terminated or revoked sooner. Performed at [facility], [place] [address], [place], [place] [address]      Labs: BNP (last 3 results) No results for input(s): BNP in the last 8760 hours. Basic Metabolic Panel: Recent Labs  Lab 01/18/19 1646 01/18/19 1652 01/18/19 2225  NA 137 135  --   K 5.8*  5.8* 5.7*  CL 101 103  --   CO2 22  --   --   GLUCOSE 149* 146*  --   BUN 38* 51*  --   CREATININE 1.69* 1.40*  --   CALCIUM 9.8  --   --    Liver Function Tests: Recent Labs  Lab 01/18/19 1646  AST 144*  ALT 42  ALKPHOS 117  BILITOT 1.5*  PROT 7.0  ALBUMIN  4.1   No results for input(s): LIPASE, AMYLASE in the last 168 hours. No results for input(s): AMMONIA in the last 168 hours. CBC: Recent Labs  Lab 01/18/19 1646 01/18/19 1652  WBC 18.9*  --   NEUTROABS 16.2*  --   HGB 16.4* 18.4*  HCT 51.7* 54.0*  MCV 81.4  --   PLT 272  --    Cardiac Enzymes: No results for input(s): CKTOTAL, CKMB, CKMBINDEX, TROPONINI in the last 168 hours. BNP: Invalid input(s): POCBNP CBG: Recent Labs  Lab 01/18/19 1644 01/18/19 2109  GLUCAP 151* 119*   D-Dimer No results for input(s): DDIMER in the last 72 hours. Hgb A1c No results for input(s): HGBA1C in the last 72 hours. Lipid Profile No results for input(s): CHOL, HDL, LDLCALC, TRIG, CHOLHDL, LDLDIRECT in the last 72 hours. Thyroid function studies No results for input(s): TSH, T4TOTAL, T3FREE, THYROIDAB in the last 72 hours.  Invalid input(s): FREET3 Anemia work up No results for input(s): VITAMINB12, FOLATE, FERRITIN, TIBC, IRON, RETICCTPCT in the last 72 hours. Urinalysis    Component Value Date/Time   COLORURINE YELLOW 04/09/2013 2003   APPEARANCEUR CLEAR 04/09/2013 2003   LABSPEC 1.014 04/09/2013 2003   PHURINE 7.0 04/09/2013 2003   GLUCOSEU NEGATIVE 04/09/2013 2003   HGBUR NEGATIVE 04/09/2013 2003   BILIRUBINUR NEGATIVE 04/09/2013 2003   KETONESUR NEGATIVE 04/09/2013 2003   PROTEINUR NEGATIVE 04/09/2013 2003   UROBILINOGEN 0.2 04/09/2013 2003   NITRITE NEGATIVE 04/09/2013 2003   LEUKOCYTESUR MODERATE (A) 04/09/2013 2003   Sepsis Labs Invalid input(s): PROCALCITONIN,  WBC,  LACTICIDVEN Microbiology Recent Results (from the past 240 hour(s))  SARS Coronavirus 2 (CEPHEID - Performed in [place] hospital lab), Hosp Order     Status: None   Collection Time: 01/18/19  5:26 PM   Specimen: Nasopharyngeal Swab  Result Value Ref Range Status   SARS Coronavirus 2 NEGATIVE NEGATIVE Final    Comment: (NOTE) If result is NEGATIVE SARS-CoV-2 target nucleic acids are NOT  DETECTED. The SARS-CoV-2 RNA is generally detectable in upper and lower  respiratory specimens during the acute phase of infection. The lowest  concentration of SARS-CoV-2 viral copies this assay can detect is 250  copies / mL. A negative result does not preclude SARS-CoV-2 infection  and should not be used as the sole basis for treatment or other  patient management decisions.  A negative result may occur with  improper specimen collection / handling, submission of specimen other  than nasopharyngeal swab, presence of viral mutation(s) within the  areas targeted by this assay, and inadequate number of viral copies  (<250 copies / mL). A negative result must be combined with clinical  observations, patient history, and epidemiological information. If result is POSITIVE SARS-CoV-2 target nucleic acids are DETECTED. The SARS-CoV-2 RNA is generally detectable in upper and lower  respiratory specimens dur ing the acute phase of infection.  Positive  results are indicative of active infection with SARS-CoV-2.  Clinical  correlation with patient history and other diagnostic information is  necessary to determine patient infection  status.  Positive results do  not rule out bacterial infection or co-infection with other viruses. If result is PRESUMPTIVE POSTIVE SARS-CoV-2 nucleic acids MAY BE PRESENT.   A presumptive positive result was obtained on the submitted specimen  and confirmed on repeat testing.  While 2019 novel coronavirus  (SARS-CoV-2) nucleic acids may be present in the submitted sample  additional confirmatory testing may be necessary for epidemiological  and / or clinical management purposes  to differentiate between  SARS-CoV-2 and other Sarbecovirus currently known to infect humans.  If clinically indicated additional testing with an alternate test  methodology [phone]) is advised. The SARS-CoV-2 RNA is generally  detectable in upper and lower respiratory sp ecimens during  the acute  phase of infection. The expected result is Negative. Fact Sheet for Patients:  StrictlyIdeas.no Fact Sheet for Healthcare Providers: BankingDealers.co.za This test is not yet approved or cleared by the [place] FDA and has been authorized for detection and/or diagnosis of SARS-CoV-2 by FDA under an Emergency Use Authorization (EUA).  This EUA will remain in effect (meaning this test can be used) for the duration of the COVID-19 declaration under Section 564(b)(1) of the Act, 21 U.S.C. section 360bbb-3(b)(1), unless the authorization is terminated or revoked sooner. Performed at [facility], [place] [address], [place], [place] [address]      Time coordinating discharge: 35 minutes  SIGNED:   Antonieta Pert, MD  [facility] 01/21/2019, 10:20 AM  If 7PM-7AM, please contact night-coverage [email or web address]

## 2019-01-21 NOTE — Plan of Care (Signed)
  Problem: Education: Goal: Knowledge of secondary prevention will improve Outcome: Not Applicable

## 2019-01-21 NOTE — Care Management Important Message (Signed)
Important Message  Patient Details  Name: [name] MRN: [id number] Date of Birth: 1925-04-13   Medicare Important Message Given:  Yes     Petar Mucci [address] 01/21/2019, 4:33 PM

## 2019-01-21 NOTE — TOC Transition Note (Signed)
Transition of Care [facility]) - CM/SW Discharge Note   Patient Details  Name: [name] MRN: [id number] Date of Birth: Apr 22, 1925  [facility] of Care [facility]) CM/SW Contact:  Pollie Friar, RN Phone Number: 01/21/2019, 1:31 PM   Clinical Narrative:    TOC received information that pt has a bed at [facility] Family aware and in agreement. PTAR arranged for transport. Granddaughter in the room aware that transport called. Bedside RN updated and d/c packet at the desk.   Final next level of care: [place] [place] Barriers to Discharge: No Barriers Identified   Patient Goals and CMS Choice   CMS Medicare.gov Compare Post Acute Care list provided to:: Patient Represenative (must comment) Choice offered to / list presented to : Adult Children  Discharge Placement              Patient chooses bed at: [facility]) Patient to be transferred to facility by: [place] Name of family member notified: Granddaughter in the room Patient and family notified of of transfer: 01/21/19  Discharge Plan and Services                                     Social Determinants of Health ([place]) Interventions     Readmission Risk Interventions No flowsheet data found.

## 2019-01-27 DEATH — deceased

## 2019-04-14 ENCOUNTER — Ambulatory Visit (HOSPITAL_COMMUNITY): Payer: Medicare Other | Admitting: Nurse Practitioner
